# Patient Record
Sex: Female | Born: 1995 | Race: Black or African American | Hispanic: No | Marital: Single | Smoking: Never smoker
Health system: Southern US, Community
[De-identification: ages and names within clinical notes are randomized; demographics above are authoritative.]

## PROBLEM LIST (undated history)

## (undated) DIAGNOSIS — D649 Anemia, unspecified: Secondary | ICD-10-CM

## (undated) DIAGNOSIS — F419 Anxiety disorder, unspecified: Secondary | ICD-10-CM

## (undated) DIAGNOSIS — F32A Depression, unspecified: Secondary | ICD-10-CM

## (undated) DIAGNOSIS — IMO0002 Reserved for concepts with insufficient information to code with codable children: Secondary | ICD-10-CM

## (undated) DIAGNOSIS — T7840XA Allergy, unspecified, initial encounter: Secondary | ICD-10-CM

## (undated) HISTORY — DX: Anemia, unspecified: D64.9

## (undated) HISTORY — PX: FRACTURE SURGERY: SHX138

## (undated) HISTORY — DX: Reserved for concepts with insufficient information to code with codable children: IMO0002

## (undated) HISTORY — DX: Allergy, unspecified, initial encounter: T78.40XA

## (undated) HISTORY — DX: Anxiety disorder, unspecified: F41.9

## (undated) HISTORY — DX: Depression, unspecified: F32.A

---
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
## 2015-02-07 ENCOUNTER — Telehealth: Payer: Self-pay | Admitting: Family Medicine

## 2015-02-07 NOTE — Telephone Encounter (Signed)
Spoke with patient  And an appointment made for 02/08/15

## 2015-02-07 NOTE — Telephone Encounter (Signed)
[facility] - Client TELEPHONE ADVICE RECORD [facility] Patient Name: Donna Montgomery DOB: Oct 07, 1995 Initial Comment Caller states she is having headaches and ear pain in right ear. Nurse Assessment Nurse: Ladona RidgelGaddy, RN, Felicia Date/Time (Eastern Time): 02/07/2015 3:15:24 PM Confirm and document reason for call. If symptomatic, describe symptoms. ---PT has had HA - level 5 (worse than ear) and R ear pain for 2 weeks. No fever. Has the patient traveled out of the country within the last 30 days? ---No Does the patient require triage? ---Yes Related visit to physician within the last 2 weeks? ---No Does the PT have any chronic conditions? (i.e. diabetes, asthma, etc.) ---Yes List chronic conditions. ---asthma Did the patient indicate they were pregnant? ---NoGuidelines Guideline Title Affirmed Question Affirmed Notes Headache [1] MODERATE headache (e.g., interferes with normal activities) AND [2] present > 24 hours AND [3] unexplained (Exceptions: analgesics not tried, typical migraine, or headache part of viral illness) Final Disposition User See Physician within 24 Hours BlueGaddy, RN, Sunny SchleinFelicia Comments Per Darliss RidgelProfile Todd, MD wants to see all his pts even if no appt --- Sue LushAndrea reached at backline and she said to tell pt she will speak with Dr. Tawanna Coolerodd now and call her back with a plan. Gave caller this advice

## 2015-02-08 ENCOUNTER — Ambulatory Visit (INDEPENDENT_AMBULATORY_CARE_PROVIDER_SITE_OTHER): Payer: 59 | Admitting: Family Medicine

## 2015-02-08 VITALS — BP 120/80 | Temp 98.2°F | Wt 164.0 lb

## 2015-02-08 DIAGNOSIS — J309 Allergic rhinitis, unspecified: Secondary | ICD-10-CM | POA: Diagnosis not present

## 2015-02-08 DIAGNOSIS — H6591 Unspecified nonsuppurative otitis media, right ear: Secondary | ICD-10-CM

## 2015-02-08 MED ORDER — FLUTICASONE PROPIONATE 50 MCG/ACT NA SUSP: 2.0000 | Freq: Every day | NASAL | Status: DC

## 2015-02-08 MED ORDER — AMOXICILLIN 875 MG PO TABS: 875.0000 mg | ORAL_TABLET | Freq: Two times a day (BID) | ORAL | Status: DC

## 2015-02-08 NOTE — Patient Instructions (Signed)
-  As we discussed, we have prescribed a new medication for you at this appointment. We discussed the common and serious potential adverse effects of this medication and you can review these and more with the pharmacist when you pick up your medication.  Please follow the instructions for use carefully and notify us immediately if you have any problems taking this medication.  -use the nasal spray 2 sprays each nostril for 1 month, then 1 spray each nostril  -follow up if symptoms worsen or persist

## 2015-02-08 NOTE — Progress Notes (Signed)
  HPI:  Acute visit for:  Ear Pain: -started 2 weeks ago -symptoms: R ear pain and sinus pain -denies: fevers, hearing loss, vision changes, sore throat, cough -hx of allergies and asthma - taking singulair, alb prophylactic with exercise - EIB  ROS: See pertinent positives and negatives per HPI.  Past Medical History  Diagnosis Date  . Allergy   . Asthma     No past surgical history on file.  Family History  Problem Relation Age of Onset  . Asthma Other     History   Social History  . Marital Status: Single    Spouse Name: N/A  . Number of Children: N/A  . Years of Education: N/A   Social History Main Topics  . Smoking status: Never Smoker   . Smokeless tobacco: Not on file  . Alcohol Use: No  . Drug Use: No  . Sexual Activity: Not on file   Other Topics Concern  . Not on file   Social History Narrative  . No narrative on file     Current outpatient prescriptions:  .  albuterol (PROVENTIL HFA;VENTOLIN HFA) 108 (90 BASE) MCG/ACT inhaler, Inhale 1 puff into the lungs every 6 (six) hours as needed. For wheezing/ and exercise induced asthma, Disp: 1 Inhaler, Rfl: 2 .  EPIDUO 0.1-2.5 % gel, Place 1 application onto the skin At bedtime. To face, Disp: , Rfl:  .  montelukast (SINGULAIR) 10 MG tablet, Take 1 tablet (10 mg total) by mouth at bedtime., Disp: 100 tablet, Rfl: 3 .  amoxicillin (AMOXIL) 875 MG tablet, Take 1 tablet (875 mg total) by mouth 2 (two) times daily., Disp: 20 tablet, Rfl: 0 .  fluticasone (FLONASE) 50 MCG/ACT nasal spray, Place 2 sprays into both nostrils daily., Disp: 16 g, Rfl: 6  EXAM:  Filed Vitals:   02/08/15 1108  BP: 120/80  Temp: 98.2 F (36.8 C)    There is no height on file to calculate BMI.  GENERAL: vitals reviewed and listed above, alert, oriented, appears well hydrated and in no acute distress  HEENT: atraumatic, conjunttiva clear, no obvious abnormalities on inspection of external nose and ears, normal appearance of ear  canals and TMs except for white effusion with bulging TM on R, clear effusion L, clear nasal congestion with boggy pale turbinates, mild post oropharyngeal erythema with PND, no tonsillar edema or exudate, no sinus TTP  NECK: no obvious masses on inspection  LUNGS: clear to auscultation bilaterally, no wheezes, rales or rhonchi, good air movement  CV: HRRR, no peripheral edema  MS: moves all extremities without noticeable abnormality  PSYCH: pleasant and cooperative, no obvious depression or anxiety  ASSESSMENT AND PLAN:  Discussed the following assessment and plan:  Allergic rhinitis, unspecified allergic rhinitis type - Plan: fluticasone (FLONASE) 50 MCG/ACT nasal spray  Mucoid otitis media of right ear, unspecified chronicity - Plan: amoxicillin (AMOXIL) 875 MG tablet  -uncontrolled allergic rhinitis w/ 2ndary otitis media -add INS, amox - proper use and risks discussed -Patient advised to return or notify a doctor immediately if symptoms worsen or persist or new concerns arise.  There are no Patient Instructions on file for this visit.   Kriste BasqueKIM, Katisha Shimizu R.

## 2015-02-08 NOTE — Progress Notes (Signed)
Pre visit review using our clinic review tool, if applicable. No additional management support is needed unless otherwise documented below in the visit note. 

## 2015-02-12 ENCOUNTER — Ambulatory Visit: Payer: Self-pay | Admitting: Internal Medicine

## 2015-09-15 HISTORY — PX: OPEN REDUCTION INTERNAL FIXATION (ORIF) HAND: SHX5991

## 2017-02-01 ENCOUNTER — Ambulatory Visit (INDEPENDENT_AMBULATORY_CARE_PROVIDER_SITE_OTHER): Payer: BLUE CROSS/BLUE SHIELD | Admitting: Family Medicine

## 2017-02-01 ENCOUNTER — Encounter: Payer: Self-pay | Admitting: Family Medicine

## 2017-02-01 VITALS — BP 100/60 | Temp 98.4°F | Ht 75.0 in | Wt 164.0 lb

## 2017-02-01 DIAGNOSIS — J3089 Other allergic rhinitis: Secondary | ICD-10-CM

## 2017-02-01 DIAGNOSIS — J45990 Exercise induced bronchospasm: Secondary | ICD-10-CM | POA: Diagnosis not present

## 2017-02-01 DIAGNOSIS — Z0000 Encounter for general adult medical examination without abnormal findings: Secondary | ICD-10-CM | POA: Insufficient documentation

## 2017-02-01 DIAGNOSIS — J4521 Mild intermittent asthma with (acute) exacerbation: Secondary | ICD-10-CM

## 2017-02-01 DIAGNOSIS — J302 Other seasonal allergic rhinitis: Secondary | ICD-10-CM

## 2017-02-01 DIAGNOSIS — N938 Other specified abnormal uterine and vaginal bleeding: Secondary | ICD-10-CM | POA: Insufficient documentation

## 2017-02-01 DIAGNOSIS — Z0001 Encounter for general adult medical examination with abnormal findings: Secondary | ICD-10-CM

## 2017-02-01 DIAGNOSIS — N6002 Solitary cyst of left breast: Secondary | ICD-10-CM

## 2017-02-01 MED ORDER — FLUTICASONE PROPIONATE 50 MCG/ACT NA SUSP: 2.0000 | Freq: Every day | NASAL | 11 refills | Status: DC

## 2017-02-01 MED ORDER — ETHYNODIOL DIAC-ETH ESTRADIOL 1-50 MG-MCG PO TABS: 1.0000 | ORAL_TABLET | Freq: Every day | ORAL | 4 refills | Status: DC

## 2017-02-01 MED ORDER — FLUTICASONE PROPIONATE HFA 220 MCG/ACT IN AERO: 2.0000 | INHALATION_SPRAY | Freq: Two times a day (BID) | RESPIRATORY_TRACT | 4 refills | Status: DC

## 2017-02-01 MED ORDER — PREDNISONE 20 MG PO TABS: ORAL_TABLET | ORAL | 1 refills | Status: DC

## 2017-02-01 MED ORDER — ALBUTEROL SULFATE HFA 108 (90 BASE) MCG/ACT IN AERS: 1.0000 | INHALATION_SPRAY | Freq: Four times a day (QID) | RESPIRATORY_TRACT | 2 refills | Status: DC | PRN

## 2017-02-01 MED ORDER — MONTELUKAST SODIUM 10 MG PO TABS: 10.0000 mg | ORAL_TABLET | Freq: Every day | ORAL | 3 refills | Status: DC

## 2017-02-01 NOTE — Progress Notes (Signed)
Donna Montgomery is a 21 year old single female nonsmoker recently graduated [employer] who comes in today for general physical examination  She has a history of allergic rhinitis for which she takes Flonase and Singulair  This past year she developed symptoms of asthma. They've gotten worse this spring. She's had a history of exercise-induced asthma for which she uses albuterol. The trainer at school send her to an allergist in [place] who gave her Flovent 220 g 1 puff twice a day. That's helped but she still has a lot of head congestion postnasal drip.  She's got a tryout for the [employer]  14 point review of systems review is otherwise negative except for extremely heavy. Zetia last 4-5 days. She's not sexually active.  Vaccinations up-to-date  BP 100/60 (BP Location: Right Arm)   Temp 98.4 F (36.9 C) (Oral)   Ht 6\' 3"  (1.905 m)   Wt 164 lb (74.4 kg)   LMP 01/26/2017   BMI 20.50 kg/m  Examination HEENT were negative neck was supple thyroid is not enlarged cardiopulmonary exam normal specifically PMI was in the fifth intercostal space midclavicular line no heaves or thrills S1-S2 within normal limits S2 is physiologically split I can appreciate no murmurs rubs nor gallops.  Abdominal exam negative extremity is normal skin normal peripheral pulses normal  Breast exam normal except for small cysts left breast 3:00 an inch from the nipple.  #1 healthy female  #2 allergic rhinitis........ continue Flonase and Singulair  #3 asthma exercise induced with a seasonal component,,,,,,,, continue the inhaled steroid,,,,, short course of oral prednisone burst and taper  #4 DU B....... BCPs........ follow-up in September

## 2017-02-01 NOTE — Patient Instructions (Signed)
Prednisone 20 mg.........Marland Kitchen. 1 tab 5 days, half a tab 5 days, then half a tab Monday Wednesday Friday for a two-week taper  Flovent....... inhaled steroid......Marland Kitchen. 1 puff twice a day. Remember to swish and spit after using this medicine  Start your BCPs now........... follow-up in September  Continue steroid nasal spray and the Singulair for your allergic rhinitis

## 2017-05-14 ENCOUNTER — Telehealth: Payer: Self-pay | Admitting: Family Medicine

## 2017-05-14 NOTE — Telephone Encounter (Signed)
Pt states she is going to [place] on 9/9 and wants to take 7 mos of meds with her. Pt need montelukast (SINGULAIR) 10 MG tablet  ethynodiol-ethinyl estradiol (ZOVIA 1/50E, 28,) 1-50 MG-MCG tablet  [facility] - [place], [place] - [address]

## 2017-05-18 ENCOUNTER — Other Ambulatory Visit: Payer: Self-pay | Admitting: Family Medicine

## 2017-05-18 DIAGNOSIS — J302 Other seasonal allergic rhinitis: Secondary | ICD-10-CM

## 2017-05-18 DIAGNOSIS — J45990 Exercise induced bronchospasm: Secondary | ICD-10-CM

## 2017-05-18 DIAGNOSIS — J4521 Mild intermittent asthma with (acute) exacerbation: Secondary | ICD-10-CM

## 2017-05-18 MED ORDER — MONTELUKAST SODIUM 10 MG PO TABS: 10.0000 mg | ORAL_TABLET | Freq: Every day | ORAL | 3 refills | Status: DC

## 2017-05-18 MED ORDER — ETHYNODIOL DIAC-ETH ESTRADIOL 1-50 MG-MCG PO TABS: 1.0000 | ORAL_TABLET | Freq: Every day | ORAL | 4 refills | Status: DC

## 2017-05-18 NOTE — Telephone Encounter (Signed)
Last OV was 02/01/17 discussing DUB and allergic rhinitis. Please advise if ok to refill for 7 months for patients trip to [place].  She states she will be gone for 7 months.

## 2017-05-18 NOTE — Telephone Encounter (Signed)
Pt states she is going to [place] on 9/9 and wants to take 7 mos of meds with her. °Pt need °montelukast (SINGULAIR) 10 MG tablet ° °ethynodiol-ethinyl estradiol (ZOVIA 1/50E, 28,) 1-50 MG-MCG tablet ° °[facility] - [place], [place] - [address] ° ° °

## 2017-07-27 ENCOUNTER — Encounter: Payer: Self-pay | Admitting: Family Medicine

## 2017-07-27 ENCOUNTER — Ambulatory Visit (INDEPENDENT_AMBULATORY_CARE_PROVIDER_SITE_OTHER): Payer: BLUE CROSS/BLUE SHIELD | Admitting: Family Medicine

## 2017-07-27 ENCOUNTER — Ambulatory Visit (INDEPENDENT_AMBULATORY_CARE_PROVIDER_SITE_OTHER)
Admission: RE | Admit: 2017-07-27 | Discharge: 2017-07-27 | Disposition: A | Discharge status: Home / Self Care | Payer: BLUE CROSS/BLUE SHIELD | Source: Ambulatory Visit | Attending: Family Medicine | Admitting: Family Medicine

## 2017-07-27 VITALS — BP 136/88 | HR 80 | Temp 98.2°F | Wt 182.0 lb

## 2017-07-27 DIAGNOSIS — M79674 Pain in right toe(s): Secondary | ICD-10-CM

## 2017-07-27 DIAGNOSIS — G8929 Other chronic pain: Secondary | ICD-10-CM

## 2017-07-27 DIAGNOSIS — S060X0A Concussion without loss of consciousness, initial encounter: Secondary | ICD-10-CM | POA: Diagnosis not present

## 2017-07-27 NOTE — Patient Instructions (Signed)
Go to the main office now for x-rays of your right foot.......... I will call you the report  By definition you had a mild concussion because she had trauma tear head that resulted in a mild headache and some other neurologic symptoms. With a mild concussion we recommend brain rest for 7-10 days. Tylenol or aspirin when necessary for headache  Return when necessary

## 2017-07-27 NOTE — Progress Notes (Signed)
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 At that time she had no loss of consciousness had a mild headache but no other neurologic symptoms. She was kept out of basketball for a week and went back and has played well since that time with no problems until 10 days ago when she got hit in the head going up for a lay up. She came out for a while then went back and played and finish the game after gain she developed a mild headache but again no neurologic symptoms. [place] team [place].  She's had pain at the base of her right fifth toe for 2-3 weeks with no history of trauma. That joint is swollen and tender.  BP 136/88 (BP Location: Left Arm, Patient Position: Sitting, Cuff Size: Normal)   Pulse 80   Temp 98.2 F (36.8 C) (Oral)   Wt 182 lb (82.6 kg)   LMP 07/13/2017 (Approximate)   BMI 22.75 kg/m  She's well-developed well-nourished female no acute distress examination HEENT was negative neurologic exam included muscle strength sensation reflexes gait all within normal limits.  Examination foot shows bilateral bunions. She does have some tenderness proximal joint right fifth toe......... x-ray pending  #1 concussion mild........... imaging not indicated....... Tylenol aspirin Motrin for mild headache........ okay to resume basketball in one week when she goes back to [place].  #2 pain swelling proximal joint right fifth toe,,,,,,,,, sent for x-ray rule out stress fracture

## 2018-04-29 ENCOUNTER — Ambulatory Visit: Payer: Self-pay | Admitting: *Deleted

## 2018-04-29 ENCOUNTER — Encounter: Payer: Self-pay | Admitting: Family Medicine

## 2018-04-29 ENCOUNTER — Other Ambulatory Visit: Payer: Self-pay | Admitting: *Deleted

## 2018-04-29 ENCOUNTER — Ambulatory Visit: Payer: BLUE CROSS/BLUE SHIELD | Admitting: Family Medicine

## 2018-04-29 VITALS — BP 110/70 | HR 74 | Temp 98.7°F | Wt 169.1 lb

## 2018-04-29 DIAGNOSIS — Z8711 Personal history of peptic ulcer disease: Secondary | ICD-10-CM

## 2018-04-29 DIAGNOSIS — R1013 Epigastric pain: Secondary | ICD-10-CM

## 2018-04-29 MED ORDER — ADAPALENE-BENZOYL PEROXIDE 0.1-2.5 % EX GEL: 1.0000 "application " | Freq: Every day | CUTANEOUS | 3 refills | Status: DC

## 2018-04-29 NOTE — Progress Notes (Signed)
  Subjective:     Patient ID: Donna Montgomery, female   DOB: 1995/10/18, 22 y.o.   MRN: [id number]  HPI Patient seen with some epigastric burning type pain past couple days. Onset around Tuesday. She did eat some pizza and feels this may have exacerbated. She has not had any nausea or vomiting. No melena. Minimal caffeine use. Rare alcohol use. She had taken a couple ibuprofen on Tuesday and Wednesday of this week but no regular nonsteroidal use.  Denies appetite or weight change. She states she had peptic ulcer 2013 with positive Helicobacter pylori- which was treated  Past Medical History:  Diagnosis Date  . Allergy   . Asthma    No past surgical history on file.  reports that she has never smoked. She has never used smokeless tobacco. She reports that she drinks alcohol. She reports that she does not use drugs. family history includes Asthma in her other. No Known Allergies   Review of Systems  Constitutional: Negative for appetite change, chills, fever and unexpected weight change.  Cardiovascular: Negative for chest pain.  Gastrointestinal: Positive for abdominal pain. Negative for abdominal distention, blood in stool, diarrhea, nausea and vomiting.       Objective:   Physical Exam  Constitutional: She appears well-developed and well-nourished.  Cardiovascular: Normal rate and regular rhythm.  Pulmonary/Chest: Effort normal and breath sounds normal.  Abdominal: Normal appearance and bowel sounds are normal. She exhibits no distension. There is tenderness in the epigastric area.  Minimal tenderness epigastric region. No guarding or rebound. No hepatomegaly or splenomegaly.       Assessment:     Patient presents with a few day history of epigastric burning sensation. Question GERD versus gastritis. History of peptic ulcer disease as above. She apparently was treated for Helicobacter pylori previously. She does not show any evidence for acute active bleeding such as melena or  hematemesis    Plan:     Parke Simmers-Bland diet -Avoid all nonsteroidals if possible -Try over-the-counter Zantac 150 mg or Pepcid 20 mg twice daily next few days. If symptoms not relieved with that try over-the-counter Nexium or Prilosec. Consider GI follow up if not relieved with any of the above. -Check helicobacter pylori stool antigen -Follow-up immediately for any progressive pain, melena, hematemesis  Kristian CoveyBruce W Burchette MD [facility]

## 2018-04-29 NOTE — Telephone Encounter (Signed)
Pt is on the schedule to see Dr. Caryl NeverBurchette this morning 04/29/2018.

## 2018-04-29 NOTE — Telephone Encounter (Signed)
Pt called with complaints of stomach pain that started on 04/26/18; she says that it is dull and burning, and it feels like it did when she had an ulcer before; the pt says that she had some lower back pain but she was moving some things on 04/24/18; also she took ibuprofen 600 mg daily on 8/14 and 8/15 but her stomach was hurting prior to this; recommendations made per nurse triage including seeing a health care provider within 4 hours; pt [facility] but he has no available appointments within the parameters per guideline; pt offered and accepted appointment with Dr Evelena PeatBruce Burchette, [facility] today at 1145; she verbalizes understanding; will route to office for notification of this upcoming appointment. Reason for Disposition . [1] MILD-MODERATE pain AND [2] constant AND [3] present > 2 hours  Answer Assessment - Initial Assessment Questions 1. LOCATION: "Where does it hurt?"      Upper abdomen between belly button and rib cage 2. RADIATION: "Does the pain shoot anywhere else?" (e.g., chest, back)    Lower Back pain (pt states that she moved some things on Sunday 04/24/18) 3. ONSET: "When did the pain begin?" (e.g., minutes, hours or days ago)      04/26/18 4. SUDDEN: "Gradual or sudden onset?"     gradual 5. PATTERN "Does the pain come and go, or is it constant?"    - If constant: "Is it getting better, staying the same, or worsening?"      (Note: Constant means the pain never goes away completely; most serious pain is constant and it progresses)     - If intermittent: "How long does it last?" "Do you have pain now?"     (Note: Intermittent means the pain goes away completely between bouts)    constant 6. SEVERITY: "How bad is the pain?"  (e.g., Scale 1-10; mild, moderate, or severe)   - MILD (1-3): doesn't interfere with normal activities, abdomen soft and not tender to touch    - MODERATE (4-7): interferes with normal activities or awakens from sleep, tender to  touch    - SEVERE (8-10): excruciating pain, doubled over, unable to do any normal activities      Moderate; has awakened pt from sleep 7. RECURRENT SYMPTOM: "Have you ever had this type of abdominal pain before?" If so, ask: "When was the last time?" and "What happened that time?"      Previous ulcer in 2013 8. CAUSE: "What do you think is causing the abdominal pain?"     Pt does not knw 9. RELIEVING/AGGRAVATING FACTORS: "What makes it better or worse?" (e.g., movement, antacids, bowel movement)     Eating makes it better; pizza and almond milk made it worse 10. OTHER SYMPTOMS: "Has there been any vomiting, diarrhea, constipation, or urine problems?"       no 11. PREGNANCY: "Is there any chance you are pregnant?" "When was your last menstrual period?"       No LMP 04/02/18  Protocols used: ABDOMINAL PAIN - [facility]

## 2018-04-29 NOTE — Patient Instructions (Signed)
Food Choices for Gastroesophageal Reflux Disease, Adult When you have gastroesophageal reflux disease (GERD), the foods you eat and your eating habits are very important. Choosing the right foods can help ease the discomfort of GERD. Consider working with a diet and nutrition specialist (dietitian) to help you make healthy food choices. What general guidelines should I follow? Eating plan  Choose healthy foods low in fat, such as fruits, vegetables, whole grains, low-fat dairy products, and lean meat, fish, and poultry.  Eat frequent, small meals instead of three large meals each day. Eat your meals slowly, in a relaxed setting. Avoid bending over or lying down until 2-3 hours after eating.  Limit high-fat foods such as fatty meats or fried foods.  Limit your intake of oils, butter, and shortening to less than 8 teaspoons each day.  Avoid the following: ? Foods that cause symptoms. These may be different for different people. Keep a food diary to keep track of foods that cause symptoms. ? Alcohol. ? Drinking large amounts of liquid with meals. ? Eating meals during the 2-3 hours before bed.  Cook foods using methods other than frying. This may include baking, grilling, or broiling. Lifestyle   Maintain a healthy weight. Ask your health care provider what weight is healthy for you. If you need to lose weight, work with your health care provider to do so safely.  Exercise for at least 30 minutes on 5 or more days each week, or as told by your health care provider.  Avoid wearing clothes that fit tightly around your waist and chest.  Do not use any products that contain nicotine or tobacco, such as cigarettes and e-cigarettes. If you need help quitting, ask your health care provider.  Sleep with the head of your bed raised. Use a wedge under the mattress or blocks under the bed frame to raise the head of the bed. What foods are not recommended? The items listed may not be a complete  list. Talk with your dietitian about what dietary choices are best for you. Grains Pastries or quick breads with added fat. French toast. Vegetables Deep fried vegetables. French fries. Any vegetables prepared with added fat. Any vegetables that cause symptoms. For some people this may include tomatoes and tomato products, chili peppers, onions and garlic, and horseradish. Fruits Any fruits prepared with added fat. Any fruits that cause symptoms. For some people this may include citrus fruits, such as oranges, grapefruit, pineapple, and lemons. Meats and other protein foods High-fat meats, such as fatty beef or pork, hot dogs, ribs, ham, sausage, salami and bacon. Fried meat or protein, including fried fish and fried chicken. Nuts and nut butters. Dairy Whole milk and chocolate milk. Sour cream. Cream. Ice cream. Cream cheese. Milk shakes. Beverages Coffee and tea, with or without caffeine. Carbonated beverages. Sodas. Energy drinks. Fruit juice made with acidic fruits (such as [place] or grapefruit). Tomato juice. Alcoholic drinks. Fats and oils Butter. Margarine. Shortening. Ghee. Sweets and desserts Chocolate and cocoa. Donuts. Seasoning and other foods Pepper. Peppermint and spearmint. Any condiments, herbs, or seasonings that cause symptoms. For some people, this may include curry, hot sauce, or vinegar-based salad dressings. Summary  When you have gastroesophageal reflux disease (GERD), food and lifestyle choices are very important to help ease the discomfort of GERD.  Eat frequent, small meals instead of three large meals each day. Eat your meals slowly, in a relaxed setting. Avoid bending over or lying down until 2-3 hours after eating.  Limit high-fat   foods such as fatty meat or fried foods. This information is not intended to replace advice given to you by your health care provider. Make sure you discuss any questions you have with your health care provider. Document Released:  08/31/2005 Document Revised: 09/01/2016 Document Reviewed: 09/01/2016 Elsevier Interactive Patient Education  2018 [employer]  Try to avoid Ibuprofen as much as possible.  Consider over the counter Zantac 150 mg  Or Pepcid 20 mg twice daily.  If no relief with the above, consider OTC Prilosec or Nexium.

## 2018-04-30 DIAGNOSIS — Z8711 Personal history of peptic ulcer disease: Secondary | ICD-10-CM | POA: Insufficient documentation

## 2018-05-02 LAB — HELICOBACTER PYLORI  SPECIAL ANTIGEN
MICRO NUMBER: 90980175
SPECIMEN QUALITY: ADEQUATE

## 2018-11-26 ENCOUNTER — Other Ambulatory Visit: Payer: Self-pay

## 2018-11-26 ENCOUNTER — Encounter (HOSPITAL_COMMUNITY): Payer: Self-pay | Admitting: Emergency Medicine

## 2018-11-26 ENCOUNTER — Emergency Department (HOSPITAL_COMMUNITY)
Admission: EM | Admit: 2018-11-26 | Discharge: 2018-11-26 | Disposition: A | Discharge status: Home / Self Care | Payer: BLUE CROSS/BLUE SHIELD | Attending: Emergency Medicine | Admitting: Emergency Medicine

## 2018-11-26 DIAGNOSIS — Z20828 Contact with and (suspected) exposure to other viral communicable diseases: Secondary | ICD-10-CM | POA: Diagnosis not present

## 2018-11-26 DIAGNOSIS — J45909 Unspecified asthma, uncomplicated: Secondary | ICD-10-CM | POA: Diagnosis not present

## 2018-11-26 DIAGNOSIS — Z789 Other specified health status: Secondary | ICD-10-CM

## 2018-11-26 NOTE — ED Notes (Signed)
Patient verbalizes understanding of discharge instructions. Opportunity for questioning and answers were provided. Armband removed by staff, pt discharged from ED.  

## 2018-11-26 NOTE — ED Triage Notes (Signed)
Pt to ER for corona virus check because she flew back from [place], [place] yesterday. No symptoms.

## 2018-11-26 NOTE — ED Provider Notes (Signed)
[facility] EMERGENCY DEPARTMENT Provider Note   CSN: [id number] Arrival date & time: 11/26/18  1411    History   Chief Complaint Chief Complaint  Patient presents with  . Follow-up    HPI Marquesha L Amison is a 23 y.o. female.     HPI Patient just returned from [place] for a basketball event.  She was on the plane and someone sitting next to her from [place] was coughing a lot and not covering her mouth.  She reports she has been symptom-free.  But was concerned about possible coronavirus exposure.  Her teammates have not developed any symptoms of illness at this point. Past Medical History:  Diagnosis Date  . Allergy   . Asthma     Patient Active Problem List   Diagnosis Date Noted  . History of peptic ulcer 04/30/2018  . Concussion with no loss of consciousness 07/27/2017  . Chronic toe pain, right foot 07/27/2017  . Routine general medical examination at a health care facility 02/01/2017  . EXERCISE INDUCED ASTHMA 07/11/2010  . Allergic rhinitis 01/29/2009    History reviewed. No pertinent surgical history.   OB History   No obstetric history on file.      Home Medications    Prior to Admission medications   Medication Sig Start Date End Date Taking? Authorizing Provider  Adapalene-Benzoyl Peroxide (EPIDUO) 0.1-2.5 % gel Apply 1 application topically at bedtime. To face 04/29/18   Roderick Pee, MD  albuterol (PROVENTIL HFA;VENTOLIN HFA) 108 (90 Base) MCG/ACT inhaler Inhale 1 puff into the lungs every 6 (six) hours as needed. For wheezing/ and exercise induced asthma 02/01/17   Roderick Pee, MD  loratadine (CLARITIN) 10 MG tablet Take 10 mg by mouth daily.    [provider]  montelukast (SINGULAIR) 10 MG tablet Take 1 tablet (10 mg total) by mouth at bedtime. 05/18/17   Roderick Pee, MD    Family History Family History  Problem Relation Age of Onset  . Asthma Other     Social History Social History   Tobacco Use  . Smoking  status: Never Smoker  . Smokeless tobacco: Never Used  Substance Use Topics  . Alcohol use: Yes    Comment: 1-2 glasses of wine per month  . Drug use: No     Allergies   Patient has no known allergies.   Review of Systems Review of Systems 10 Systems reviewed and are negative for acute change except as noted in the HPI.   Physical Exam Updated Vital Signs BP 121/69 (BP Location: Right Arm)   Pulse 64   Temp 98.3 F (36.8 C) (Oral)   Resp 16   SpO2 100%   Physical Exam Constitutional:      Appearance: She is well-developed.  HENT:     Head: Normocephalic and atraumatic.  Eyes:     Extraocular Movements: Extraocular movements intact.     Conjunctiva/sclera: Conjunctivae normal.  Neck:     Musculoskeletal: Neck supple.  Cardiovascular:     Rate and Rhythm: Normal rate and regular rhythm.     Heart sounds: Normal heart sounds.  Pulmonary:     Effort: Pulmonary effort is normal.     Breath sounds: Normal breath sounds.  Abdominal:     General: Bowel sounds are normal. There is no distension.     Palpations: Abdomen is soft.     Tenderness: There is no abdominal tenderness.  Musculoskeletal: Normal range of motion.  Skin:  General: Skin is warm and dry.  Neurological:     Mental Status: She is alert and oriented to person, place, and time.     GCS: GCS eye subscore is 4. GCS verbal subscore is 5. GCS motor subscore is 6.     Coordination: Coordination normal.      ED Treatments / Results  Labs (all labs ordered are listed, but only abnormal results are displayed) Labs Reviewed - No data to display  EKG None  Radiology No results found.  Procedures Procedures (including critical care time)  Medications Ordered in ED Medications - No data to display   Initial Impression / Assessment and Plan / ED Course  I have reviewed the triage vital signs and the nursing notes.  Pertinent labs & imaging results that were available during my care of the  patient were reviewed by me and considered in my medical decision making (see chart for details).        Patient with recent travel but asymptomatic.  No known direct contact with coronavirus.  Patient given precautions for self isolation and observing for fever, cough, shortness of breath or other associated symptoms.  Follow-up plan and return precautions reviewed.  Final Clinical Impressions(s) / ED Diagnoses   Final diagnoses:  History of recent air travel    ED Discharge Orders    None       Arby Barrette, MD 11/26/18 1621

## 2018-11-26 NOTE — ED Notes (Signed)
Pt asymptomatic. Pt given return precautions for SOB. Advised pt to contact PCP if COVID symptoms occur for testing directions.

## 2018-11-26 NOTE — Discharge Instructions (Signed)
1.  As you have traveled recently, minimize any exposure to others.  Use good hand hygiene and always cover your mouth and nose with the tissue if coughing. 2.  Try to stay home and isolated as much as possible over the next 2 weeks.  If you develop fever, cough, body ache, call your doctor to review your symptoms.  If your symptoms are more severe than typical cold, return to the emergency department.

## 2019-02-14 ENCOUNTER — Other Ambulatory Visit: Payer: Self-pay

## 2019-02-14 ENCOUNTER — Ambulatory Visit: Payer: BLUE CROSS/BLUE SHIELD | Admitting: Family Medicine

## 2019-02-14 VITALS — BP 120/70 | Ht 77.0 in | Wt 175.0 lb

## 2019-02-14 DIAGNOSIS — S76012A Strain of muscle, fascia and tendon of left hip, initial encounter: Secondary | ICD-10-CM

## 2019-02-14 DIAGNOSIS — S83282D Other tear of lateral meniscus, current injury, left knee, subsequent encounter: Secondary | ICD-10-CM

## 2019-02-14 NOTE — Patient Instructions (Signed)
You have irritation, possibly a small tear of your lateral meniscus. Start physical therapy and do home exercises on days you don't go to therapy. Avoid deep squats, lunges, leg press for now. Icing, tylenol, aleve only if needed. Follow up with me in 1 month. We will consider repeating the MRI if not improving but I expect you to feel a lot better.  You have compensatory weakness/strain of your gluteus medius of your left hip also. Start physical therapy for this and do home exercises on days you don't go to therapy. Your exam is otherwise reassuring.

## 2019-02-15 ENCOUNTER — Encounter: Payer: Self-pay | Admitting: Family Medicine

## 2019-02-15 NOTE — Progress Notes (Signed)
PCP: Roderick Pee, MD (Inactive)  Subjective:   HPI: Patient is a 23 y.o. female here for left hip and knee pain.  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 She states approximately 8 months ago during a game she came down hard from a rebound and fell down onto her left side and feels like she injured the lateral aspect of her left knee. Do not have any hip pain at the time but a couple months later started to develop posterior lateral left hip pain. Pain in both areas is sharp. She does get some catching rarely in the left knee though has not had any lately. She reports having done some shock therapy, a little bit of physical therapy and some type of anti-inflammatory that was prescribed to her. She reports limping throughout the rest of her season. She denies any back pain, numbness. No prior injuries to the left hip or knee. No pain on the right side.  Past Medical History:  Diagnosis Date  . Allergy   . Asthma     Current Outpatient Medications on File Prior to Visit  Medication Sig Dispense Refill  . Adapalene-Benzoyl Peroxide (EPIDUO) 0.1-2.5 % gel Apply 1 application topically at bedtime. To face 45 g 3  . albuterol (PROVENTIL HFA;VENTOLIN HFA) 108 (90 Base) MCG/ACT inhaler Inhale 1 puff into the lungs every 6 (six) hours as needed. For wheezing/ and exercise induced asthma 1 Inhaler 2   No current facility-administered medications on file prior to visit.     History reviewed. No pertinent surgical history.  No Known Allergies  Social History   Socioeconomic History  . Marital status: Single    Spouse name: Not on file  . Number of children: Not on file  . Years of education: Not on file  . Highest education level: Not on file  Occupational History  . Not on file  Social Needs  . Financial resource strain: Not on file  . Food insecurity:    Worry: Not on file    Inability: Not on file  . Transportation needs:    Medical: Not on file     Non-medical: Not on file  Tobacco Use  . Smoking status: Never Smoker  . Smokeless tobacco: Never Used  Substance and Sexual Activity  . Alcohol use: Yes    Comment: 1-2 glasses of wine per month  . Drug use: No  . Sexual activity: Never  Lifestyle  . Physical activity:    Days per week: Not on file    Minutes per session: Not on file  . Stress: Not on file  Relationships  . Social connections:    Talks on phone: Not on file    Gets together: Not on file    Attends religious service: Not on file    Active member of club or organization: Not on file    Attends meetings of clubs or organizations: Not on file    Relationship status: Not on file  . Intimate partner violence:    Fear of current or ex partner: Not on file    Emotionally abused: Not on file    Physically abused: Not on file    Forced sexual activity: Not on file  Other Topics Concern  . Not on file  Social History Narrative  . Not on file    Family History  Problem Relation Age of Onset  . Asthma Other     BP 120/70   Ht 6\' 5"  (1.956  m)   Wt 175 lb (79.4 kg)   BMI 20.75 kg/m   Review of Systems: See HPI above.     Objective:  Physical Exam:  Gen: NAD, comfortable in exam room  Left hip: No deformity. FROM with 5/5 strength except 4/5 with hip abduction. TTP within glut medius, less piriformis and greater trochanter. NVI distally.  Right hip: No deformity. FROM with 5/5 strength. No tenderness to palpation. NVI distally.  Left knee: No gross deformity, ecchymoses, swelling. TTP lateral joint line. FROM with 5/5 strength flexion and extension. Negative ant/post drawers. Negative valgus/varus testing. Negative lachmans. Negative mcmurrays, apleys, patellar apprehension, bounce.  Lateral pain with thessalys. Negative ober's. NV intact distally.  Right knee: No deformity. FROM with 5/5 strength. No tenderness to palpation. NVI distally.   Assessment & Plan:  1. Left knee pain -  independently reviewed a printed MRI from [place] - ligaments intact, effusion noted, no apparent meniscus tear.  Unable to visualize on disc however.  Exam consistent with at least irritation of lateral meniscus.  Start physical therapy.  Icing, tylenol, aleve if needed.  F/u in 1 month.  Consider repeat MRI if not improving as expected.  2. Left hip pain - 2/2 compensatory glut medius strain.  Start physical therapy, home exercises.

## 2019-02-20 ENCOUNTER — Telehealth: Payer: Self-pay | Admitting: Physical Therapy

## 2019-02-20 NOTE — Telephone Encounter (Signed)
Contacted pt due to her having an evaluation on 6/9 at 4pm and per chart review it appears she had gone through testing for COVID but unfortunately our system did not show results of the testing, and we need to have documentation regarding a negative result.  She stated she had her test results in her my chart, and that it was Negative. I discussed that in order for [place] to see her we needed that negative confirmation and if she can bring it in with her to her appointment.

## 2019-02-21 ENCOUNTER — Other Ambulatory Visit: Payer: Self-pay

## 2019-02-21 ENCOUNTER — Encounter: Payer: Self-pay | Admitting: Physical Therapy

## 2019-02-21 ENCOUNTER — Ambulatory Visit: Payer: BC Managed Care – PPO | Attending: Family Medicine | Admitting: Physical Therapy

## 2019-02-21 DIAGNOSIS — M25552 Pain in left hip: Secondary | ICD-10-CM | POA: Diagnosis present

## 2019-02-21 DIAGNOSIS — G8929 Other chronic pain: Secondary | ICD-10-CM | POA: Insufficient documentation

## 2019-02-21 DIAGNOSIS — M6281 Muscle weakness (generalized): Secondary | ICD-10-CM | POA: Diagnosis present

## 2019-02-21 DIAGNOSIS — M25562 Pain in left knee: Secondary | ICD-10-CM | POA: Insufficient documentation

## 2019-02-21 NOTE — Therapy (Signed)
[facility] [address] [place], [place], [address] Phone: [phone]   Fax:  [phone]  Physical Therapy Evaluation  Patient Details  Name: Donna Montgomery MRN: [id number] Date of Birth: 04/06/1996 Referring Provider (PT): Norton BlizzardShane Hudnall MD   Encounter Date: 02/21/2019  PT End of Session - 02/21/19 1559    Visit Number  1    Number of Visits  13    Date for PT Re-Evaluation  04/04/19    Authorization Type  BCBS    PT Start Time  1600    PT Stop Time  1652    PT Time Calculation (min)  52 min    Activity Tolerance  Patient tolerated treatment well    Behavior During Therapy  [facility] for tasks assessed/performed       Past Medical History:  Diagnosis Date  . Allergy   . Asthma     Past Surgical History:  Procedure Laterality Date  . OPEN REDUCTION INTERNAL FIXATION (ORIF) HAND Left 2017    There were no vitals filed for this visit.   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 She reported going for rebound landing on the L leg and felt pain but kept playing. this occurred back in November 2019. End of the day she reported the knee locking and having pain in the knee.  She reports having popping, giving away and limited knee flexion. Since onset the pain inthe knee has gotten better. She reports the hip started due to favor the knee, the hip started in December and has gradually worsened.    Limitations  Walking;Standing;Other (comment);Sitting   running   How long can you sit comfortably?  90 min     How long can you stand comfortably?  30 min     How long can you walk comfortably?  15-30 min    Diagnostic tests  in [place]    Patient Stated Goals  playing basketball, return to running and exercise with no pain.     Currently in Pain?  Yes    Pain Score  2    at worst 8/10,    Pain Location  Knee    Pain Orientation  Left    Pain Descriptors / Indicators  Dull;Sharp    Pain Type  Chronic pain    Pain Onset  More than a month ago    Pain Frequency  Intermittent    Aggravating Factors   deep knee bending, prolonged standing/ walking, jumping, stairs, lateral movements    Pain Relieving Factors  ice, medication, pull under the knee with rest    Multiple Pain Sites  Yes    Pain Score  5   at worst 10/10   Pain Location  Hip    Pain Orientation  Left    Pain Descriptors / Indicators  Dull;Aching;Sharp   pinch   Pain Type  Chronic pain    Pain Onset  More than a month ago    Pain Frequency  Constant    Aggravating Factors   direct pressure, standing, walking, sitting, squatting, stretching/ yoga    Pain Relieving Factors  ice, medication,          [facility] PT Assessment - 02/21/19 1608      Assessment   Medical Diagnosis  L glute medius strain, L lateral meniscus tear  Referring Provider (PT)  Karlton Lemon MD    Onset Date/Surgical Date  --   knee in November, Hip December   Hand Dominance  Right    Next MD Visit  July    Prior Therapy  no      Precautions   Precaution Comments  deep squating/ knee bending, be mindfull       Restrictions   Weight Bearing Restrictions  No      Balance Screen   Has the patient fallen in the past 6 months  No    Has the patient had a decrease in activity level because of a fear of falling?   No    Is the patient reluctant to leave their home because of a fear of falling?   No      Home Film/video editor residence    Living Arrangements  Parent    Available Help at Discharge  Family    Type of [place] to enter    Entrance Stairs-Number of Steps  42    Entrance Stairs-Rails  Can reach both    [place]  One level    [place]  None      Prior Function   Level of Independence  Independent    Vocation  Full time employment   basketball    Vocation Requirements  running  jumping, sprinting,       Cognition   Overall Cognitive Status  Within Functional Limits for tasks assessed      Observation/Other Assessments   Focus on Therapeutic Outcomes (FOTO)   54% limited   29% limited     Posture/Postural Control   Posture/Postural Control  Postural limitations    Postural Limitations  Forward head      ROM / Strength   AROM / PROM / Strength  AROM;Strength;PROM      AROM   AROM Assessment Site  Knee    Right/Left Knee  Right;Left    Right Knee Extension  0    Right Knee Flexion  136    Left Knee Extension  0    Left Knee Flexion  130   pain returning to extension     PROM   PROM Assessment Site  Knee      Strength   Strength Assessment Site  Hip;Knee    Right/Left Hip  Right;Left    Right Hip Flexion  5/5    Right Hip Extension  5/5    Right Hip ABduction  5/5    Left Hip Flexion  4/5    Left Hip Extension  4-/5    Left Hip ABduction  3+/5    Right/Left Knee  Right;Left    Right Knee Flexion  5/5    Right Knee Extension  5/5    Left Knee Flexion  5/5    Left Knee Extension  5/5      Palpation   Palpation comment  TTP along the TFL, glute medius, minimus and along the piriformis and greater trochanter.  L knee lateral joint line pain      Special Tests    Special Tests  Meniscus Tests    Meniscus Tests  McMurray Test;other      McMurray Test   Findings  Positive    Side  Left      other   Findings  Positive    Comments  [facility]  Ambulation/Gait   Ambulation/Gait  Yes    Gait Pattern  Within Functional Limits                Objective measurements completed on examination: See above findings.      [facility] Adult PT Treatment/Exercise - 02/21/19 1608      Exercises   Exercises  Knee/Hip      Knee/Hip Exercises: Stretches   ITB Stretch  Left;2 reps;30 seconds   in R sidelying     Knee/Hip Exercises: Sidelying   Clams  in R sidelying 1 x 15      Manual Therapy   Manual therapy comments  MTPR along the  glute medius x 3             PT Education - 02/21/19 1559    Education Details  evaluation findings, POC, goals, HEp with proper form/ rationale, anatomy of the area involved.    Person(s) Educated  Patient    Methods  Explanation;Verbal cues;Handout    Comprehension  Verbalized understanding;Verbal cues required       PT Short Term Goals - 02/21/19 1700      PT SHORT TERM GOAL #1   Title  pt to be I with inital HEP     Time  3    Period  Weeks    Status  New    Target Date  03/14/19        PT Long Term Goals - 02/21/19 1700      PT LONG TERM GOAL #1   Title  pt to increase LLE strength grossly to 5/5 with no reprot of pain during testing for hip/ knee stability     Time  6    Period  Weeks    Status  New    Target Date  04/04/19      PT LONG TERM GOAL #2   Title  pt to be able to perform SLS balance for >/= 30 seconds with pertubations reporting no pain for stability     Time  6    Period  Weeks    Status  New    Target Date  04/04/19      PT LONG TERM GOAL #3   Title  pt to be able to perform dynamic plyometric activities with no report of pain or instability for functional return to sport    Time  6    Period  Weeks    Status  New    Target Date  04/04/19      PT LONG TERM GOAL #4   Title  pt to be able to perofrm knee extension exercises to tolerance with pain and return to running and general exercise with no limitations per personal goals     Time  6    Period  Weeks    Status  New    Target Date  04/04/19      PT LONG TERM GOAL #5   Title  increase FOTO score to </= 29% limited to demo improvement in function.     Time  6    Period  Weeks    Status  New    Target Date  04/04/19             Plan - 02/21/19 1656    Clinical Impression Statement  pt presents to OPPT with CC of L knee and hip pain that occured while playing basketball and pt landing on the LLE and felt pain with  report of locking/ catching and buckling and noted assocated  hip soreness due to potentially compensation. She demonstrates functional hip/ knee ROM, but exhibits weakness in the L hip grossly compared bil. special testing was positvie for potential meniscal involvement which is consistent with dx. She would benefit from physical therapy to decrease hip/ knee pain, increase strength, improve functional and dynamic activities for return to sport by addressing the deficits listed.     Stability/Clinical Decision Making  Stable/Uncomplicated    Clinical Decision Making  Low    Rehab Potential  Excellent    PT Frequency  2x / week    PT Duration  6 weeks    PT Treatment/Interventions  ADLs/Self Care Home Management;Cryotherapy;Electrical Stimulation;Iontophoresis 4mg /ml Dexamethasone;Moist Heat;Ultrasound;Functional mobility training;Therapeutic activities;Therapeutic exercise;Balance training;Dry needling;Passive range of motion;Manual techniques;Taping;Patient/family education    PT Next Visit Plan  review/ update HEP, discuss DN, STW along glute medius/ TFL strengthening of the knee/ hip, balance training,     PT Home Exercise Plan  SLR, sidelying hipabduction, sidelying clam, bridge, ITB stretching.     Consulted and Agree with Plan of Care  Patient       Patient will benefit from skilled therapeutic intervention in order to improve the following deficits and impairments:  Pain, Increased fascial restricitons, Improper body mechanics, Postural dysfunction, Decreased strength, Decreased balance, Decreased activity tolerance, Decreased endurance  Visit Diagnosis: Chronic pain of left knee  Pain in left hip  Muscle weakness (generalized)     Problem List Patient Active Problem List   Diagnosis Date Noted  . History of peptic ulcer 04/30/2018  . Concussion with no loss of consciousness 07/27/2017  . Chronic toe pain, right foot 07/27/2017  . Routine general medical examination at a health care facility 02/01/2017  . EXERCISE INDUCED ASTHMA  07/11/2010  . Allergic rhinitis 01/29/2009   Lulu RidingKristoffer Ronnica Dreese PT, DPT, LAT, ATC  02/21/19  5:05 PM      [facility] [address] [place], [place], [address] Phone: [phone]   Fax:  [phone]  Name: Donna Gashkela L Harkin MRN: [id number] Date of Birth: 06-Dec-1995

## 2019-02-27 ENCOUNTER — Other Ambulatory Visit: Payer: Self-pay

## 2019-02-27 ENCOUNTER — Ambulatory Visit: Payer: BC Managed Care – PPO | Admitting: Physical Therapy

## 2019-02-27 ENCOUNTER — Encounter: Payer: Self-pay | Admitting: Physical Therapy

## 2019-02-27 DIAGNOSIS — G8929 Other chronic pain: Secondary | ICD-10-CM

## 2019-02-27 DIAGNOSIS — M25562 Pain in left knee: Secondary | ICD-10-CM | POA: Diagnosis not present

## 2019-02-27 DIAGNOSIS — M25552 Pain in left hip: Secondary | ICD-10-CM

## 2019-02-27 DIAGNOSIS — M6281 Muscle weakness (generalized): Secondary | ICD-10-CM

## 2019-02-27 NOTE — Therapy (Signed)
[place], [place], [address] Phone: [phone]   Fax:  [phone]  Physical Therapy Treatment  Patient Details  Name: Donna Montgomery MRN: [id number] Date of Birth: 1996-01-23 Referring Provider (PT): Karlton Lemon MD   Encounter Date: 02/27/2019  PT End of Session - 02/27/19 1501    Visit Number  2    Number of Visits  13    Date for PT Re-Evaluation  04/04/19    Authorization Type  BCBS    PT Start Time  1502    PT Stop Time  1555    PT Time Calculation (min)  53 min    Activity Tolerance  Patient tolerated treatment well    Behavior During Therapy  [facility] for tasks assessed/performed       Past Medical History:  Diagnosis Date  . Allergy   . Asthma     Past Surgical History:  Procedure Laterality Date  . OPEN REDUCTION INTERNAL FIXATION (ORIF) HAND Left 2017    There were no vitals filed for this visit.  Subjective Assessment - 02/27/19 1501    Subjective  "I am feeling good, I've been doing the exercise which are helping but have been getting some pulling in the knee"    Patient Stated Goals  playing basketball, return to running and exercise with no pain.     Currently in Pain?  Yes    Pain Score  4     Pain Orientation  Left    Pain Descriptors / Indicators  Aching    Pain Type  Chronic pain    Pain Score  3    Pain Location  Hip    Pain Orientation  Left    Pain Descriptors / Indicators  Aching    Pain Type  Chronic pain    Pain Onset  More than a month ago    Pain Frequency  Intermittent         [facility] PT Assessment - 02/27/19 0001      Assessment   Medical Diagnosis  L glute medius strain, L lateral meniscus tear    Referring Provider (PT)  Karlton Lemon MD    Hand Dominance  Right                   [facility] Adult PT Treatment/Exercise - 02/27/19 0001      Knee/Hip Exercises: Stretches   Active Hamstring Stretch  3 reps;30 seconds    ITB Stretch  2 reps;30  seconds      Knee/Hip Exercises: Aerobic   Elliptical  L5 x 5 min elevation L1      Knee/Hip Exercises: Seated   Sit to Sand  1 set;15 reps;without UE support      Knee/Hip Exercises: Sidelying   Hip ABduction  2 sets;10 reps      Modalities   Modalities  Moist Heat      Moist Heat Therapy   Number Minutes Moist Heat  10 Minutes    Moist Heat Location  Hip   L hip in R sidelying     Manual Therapy   Manual Therapy  Soft tissue mobilization;Other (comment)    Manual therapy comments  skilled palpation and monitoring of pt throughout TPDN    Soft tissue mobilization  IASTM along glute medius/ piriformis, and along the distal biceps femoris    Other Manual Therapy  MTPR along the biceps femoris x 2       Trigger  Point Dry Needling - 02/27/19 0001    Consent Given?  Yes    Education Handout Provided  Yes    Muscles Treated Back/Hip  Gluteus medius;Piriformis;Gluteus minimus    Gluteus Minimus Response  Twitch response elicited;Palpable increased muscle length    Gluteus Medius Response  Twitch response elicited;Palpable increased muscle length    Piriformis Response  Twitch response elicited;Palpable increased muscle length           PT Education - 02/27/19 1553    Education Details  muscle anatomy and referral patterns. What TPDN is, what to expect and benefits. reviewed HEP and updated    Person(s) Educated  Patient    Methods  Explanation;Verbal cues;Handout    Comprehension  Verbalized understanding;Verbal cues required       PT Short Term Goals - 02/21/19 1700      PT SHORT TERM GOAL #1   Title  pt to be I with inital HEP     Time  3    Period  Weeks    Status  New    Target Date  03/14/19        PT Long Term Goals - 02/21/19 1700      PT LONG TERM GOAL #1   Title  pt to increase LLE strength grossly to 5/5 with no reprot of pain during testing for hip/ knee stability     Time  6    Period  Weeks    Status  New    Target Date  04/04/19      PT  LONG TERM GOAL #2   Title  pt to be able to perform SLS balance for >/= 30 seconds with pertubations reporting no pain for stability     Time  6    Period  Weeks    Status  New    Target Date  04/04/19      PT LONG TERM GOAL #3   Title  pt to be able to perform dynamic plyometric activities with no report of pain or instability for functional return to sport    Time  6    Period  Weeks    Status  New    Target Date  04/04/19      PT LONG TERM GOAL #4   Title  pt to be able to perofrm knee extension exercises to tolerance with pain and return to running and general exercise with no limitations per personal goals     Time  6    Period  Weeks    Status  New    Target Date  04/04/19      PT LONG TERM GOAL #5   Title  increase FOTO score to </= 29% limited to demo improvement in function.     Time  6    Period  Weeks    Status  New    Target Date  04/04/19            Plan - 02/27/19 1554    Clinical Impression Statement  pt reports improvement in the hip today with some increased hamstring tightness located distally. educated and consent was provided for TPDN focusing on glute med/ piriformis followed with IASTM techniques. continued hips strengthening and stretching, pt report improvement of pain, utilized MHP to calm down soreness at the end of the session.    PT Treatment/Interventions  ADLs/Self Care Home Management;Cryotherapy;Electrical Stimulation;Iontophoresis 4mg /ml Dexamethasone;Moist Heat;Ultrasound;Functional mobility training;Therapeutic activities;Therapeutic exercise;Balance training;Dry needling;Passive range of motion;Manual techniques;Taping;Patient/family education  PT Next Visit Plan  review/ update HEP, response to DN, STW along glute medius/ TFL strengthening of the knee/ hip, balance training,    PT Home Exercise Plan  SLR, sidelying hipabduction, sidelying clam, bridge, ITB stretching. hamstring stretching    Consulted and Agree with Plan of Care  Patient        Patient will benefit from skilled therapeutic intervention in order to improve the following deficits and impairments:  Pain, Increased fascial restricitons, Improper body mechanics, Postural dysfunction, Decreased strength, Decreased balance, Decreased activity tolerance, Decreased endurance  Visit Diagnosis: Chronic pain of left knee  Pain in left hip  Muscle weakness (generalized)     Problem List Patient Active Problem List   Diagnosis Date Noted  . History of peptic ulcer 04/30/2018  . Concussion with no loss of consciousness 07/27/2017  . Chronic toe pain, right foot 07/27/2017  . Routine general medical examination at a health care facility 02/01/2017  . EXERCISE INDUCED ASTHMA 07/11/2010  . Allergic rhinitis 01/29/2009   Lulu RidingKristoffer Wendall Isabell PT, DPT, LAT, ATC  02/27/19  3:58 PM      [facility] [address] [place], [place], [address] Phone: [phone]   Fax:  [phone]  Name: Hiram Gashkela L Schedler MRN: [id number] Date of Birth: [birth year]

## 2019-03-02 ENCOUNTER — Other Ambulatory Visit: Payer: Self-pay

## 2019-03-02 ENCOUNTER — Encounter: Payer: Self-pay | Admitting: Physical Therapy

## 2019-03-02 ENCOUNTER — Ambulatory Visit: Payer: BC Managed Care – PPO | Admitting: Physical Therapy

## 2019-03-02 DIAGNOSIS — G8929 Other chronic pain: Secondary | ICD-10-CM

## 2019-03-02 DIAGNOSIS — M6281 Muscle weakness (generalized): Secondary | ICD-10-CM

## 2019-03-02 DIAGNOSIS — M25552 Pain in left hip: Secondary | ICD-10-CM

## 2019-03-02 DIAGNOSIS — M25562 Pain in left knee: Secondary | ICD-10-CM | POA: Diagnosis not present

## 2019-03-02 NOTE — Therapy (Signed)
[place], [place], [address] Phone: [phone]   Fax:  [phone]  Physical Therapy Treatment  Patient Details  Name: Donna Montgomery MRN: [id number] Date of Birth: 08-Apr-1996 Referring Provider (PT): Karlton Lemon MD   Encounter Date: 03/02/2019  PT End of Session - 03/02/19 1529    Visit Number  3    Number of Visits  13    Date for PT Re-Evaluation  04/04/19    PT Start Time  9381    PT Stop Time  1638    PT Time Calculation (min)  68 min    Activity Tolerance  Patient tolerated treatment well    Behavior During Therapy  [facility] for tasks assessed/performed       Past Medical History:  Diagnosis Date  . Allergy   . Asthma     Past Surgical History:  Procedure Laterality Date  . OPEN REDUCTION INTERNAL FIXATION (ORIF) HAND Left 2017    There were no vitals filed for this visit.  Subjective Assessment - 03/02/19 1530    Subjective  "No pain, I am doing well with the exercise.    Patient Stated Goals  playing basketball, return to running and exercise with no pain.     Currently in Pain?  No/denies    Pain Score  0-No pain                       [facility] Adult PT Treatment/Exercise - 03/02/19 1540      Knee/Hip Exercises: Stretches   Active Hamstring Stretch  2 reps;30 seconds   PNF contract/ relax   ITB Stretch  2 reps;30 seconds      Knee/Hip Exercises: Aerobic   Elliptical  L8 x 6 min elevation L1      Knee/Hip Exercises: Plyometrics   Bilateral Jumping  3 sets;10 reps   using mirror for visual cues   Bilateral Jumping Limitations  intially pt shifted hips to the the R, but with verbal/ visual cues she was able to correct form. Cues needed for knee bending with landing to allow for shock absoprtion inthe quads versus the knee.       Knee/Hip Exercises: Standing   Other Standing Knee Exercises  lateral band walks 2 x 20 with blue theraband   using X technique     Modalities   Modalities  Cryotherapy      Cryotherapy   Number Minutes Cryotherapy  10 Minutes    Cryotherapy Location  Hip;Knee   in R sidelying     Manual Therapy   Manual therapy comments  skilled palpation and monitoring of pt throughout TPDN    Soft tissue mobilization  IASTM along glute medius/ piriformis, and along the distal biceps femoris       Trigger Point Dry Needling - 03/02/19 0001    Consent Given?  Yes    Education Handout Provided  Previously provided    Electrical Stimulation Performed with Dry Needling  Yes    E-stim with Dry Needling Details  IFC set-up, frequency at 10, channel 1 and 2 bil first dot   x 10 min   Gluteus Medius Response  Palpable increased muscle length;Twitch response elicited    Piriformis Response  Twitch response elicited;Palpable increased muscle length        Balance Exercises - 03/02/19 1634      Balance Exercises: Standing   Rebounder  Single leg;Foam/compliant surface;Static;15 reps   with  yellow ball       PT Education - 03/02/19 1637    Education Details  updated HEP for lateral band walks, and working on light jumping Manufacturing systems engineerusing mirror for visual cues    Person(s) Educated  Patient    Methods  Explanation;Handout;Verbal cues    Comprehension  Verbalized understanding;Verbal cues required       PT Short Term Goals - 02/21/19 1700      PT SHORT TERM GOAL #1   Title  pt to be I with inital HEP     Time  3    Period  Weeks    Status  New    Target Date  03/14/19        PT Long Term Goals - 02/21/19 1700      PT LONG TERM GOAL #1   Title  pt to increase LLE strength grossly to 5/5 with no reprot of pain during testing for hip/ knee stability     Time  6    Period  Weeks    Status  New    Target Date  04/04/19      PT LONG TERM GOAL #2   Title  pt to be able to perform SLS balance for >/= 30 seconds with pertubations reporting no pain for stability     Time  6    Period  Weeks    Status  New    Target Date   04/04/19      PT LONG TERM GOAL #3   Title  pt to be able to perform dynamic plyometric activities with no report of pain or instability for functional return to sport    Time  6    Period  Weeks    Status  New    Target Date  04/04/19      PT LONG TERM GOAL #4   Title  pt to be able to perofrm knee extension exercises to tolerance with pain and return to running and general exercise with no limitations per personal goals     Time  6    Period  Weeks    Status  New    Target Date  04/04/19      PT LONG TERM GOAL #5   Title  increase FOTO score to </= 29% limited to demo improvement in function.     Time  6    Period  Weeks    Status  New    Target Date  04/04/19            Plan - 03/02/19 1638    Clinical Impression Statement  Donna Montgomery reports significant improvement with DN last session. continued TPDN on the glute medius using E-stim. continued IASTM techniques along the glute medius. worked on Social research officer, governmentbalance training and jump mechanics which she did require verbal and visual cues for proper form to avoid compensation and allow for knee flexion to promote shock absoprtion. utilized ice pack end of session to calm down soreness end of session.    PT Treatment/Interventions  ADLs/Self Care Home Management;Cryotherapy;Electrical Stimulation;Iontophoresis 4mg /ml Dexamethasone;Moist Heat;Ultrasound;Functional mobility training;Therapeutic activities;Therapeutic exercise;Balance training;Dry needling;Passive range of motion;Manual techniques;Taping;Patient/family education    PT Next Visit Plan  response to DN, STW along glute medius/ TFL strengthening of the knee/ hip, balance training, jump training/ mechanics, quad/ hamstring strengthening.    PT Home Exercise Plan  SLR, sidelying hipabduction, sidelying clam, bridge, ITB stretching. hamstring stretching, lateral band walks       Patient will  benefit from skilled therapeutic intervention in order to improve the following deficits and  impairments:  Pain, Increased fascial restricitons, Improper body mechanics, Postural dysfunction, Decreased strength, Decreased balance, Decreased activity tolerance, Decreased endurance  Visit Diagnosis: 1. Chronic pain of left knee   2. Pain in left hip   3. Muscle weakness (generalized)        Problem List Patient Active Problem List   Diagnosis Date Noted  . History of peptic ulcer 04/30/2018  . Concussion with no loss of consciousness 07/27/2017  . Chronic toe pain, right foot 07/27/2017  . Routine general medical examination at a health care facility 02/01/2017  . EXERCISE INDUCED ASTHMA 07/11/2010  . Allergic rhinitis 01/29/2009   Lulu RidingKristoffer Calel Pisarski PT, DPT, LAT, ATC  03/02/19  4:44 PM      [facility] [address] [place], [place], [address] Phone: [phone]   Fax:  [phone]  Name: Donna Montgomery MRN: [id number] Date of Birth: October 12, 1995

## 2019-03-07 ENCOUNTER — Other Ambulatory Visit: Payer: Self-pay

## 2019-03-07 ENCOUNTER — Encounter: Payer: Self-pay | Admitting: Physical Therapy

## 2019-03-07 ENCOUNTER — Ambulatory Visit: Payer: BC Managed Care – PPO | Admitting: Physical Therapy

## 2019-03-07 DIAGNOSIS — M25562 Pain in left knee: Secondary | ICD-10-CM

## 2019-03-07 DIAGNOSIS — M25552 Pain in left hip: Secondary | ICD-10-CM

## 2019-03-07 DIAGNOSIS — M6281 Muscle weakness (generalized): Secondary | ICD-10-CM

## 2019-03-07 DIAGNOSIS — G8929 Other chronic pain: Secondary | ICD-10-CM

## 2019-03-07 NOTE — Therapy (Signed)
[facility] [address] [place], [place], [address] Phone: [phone]   Fax:  [phone]  Physical Therapy Treatment  Patient Details  Name: Donna Montgomery MRN: [id number] Date of Birth: 18-Dec-1995 Referring Provider (PT): Norton BlizzardShane Hudnall MD   Encounter Date: 03/07/2019  PT End of Session - 03/07/19 1518    Visit Number  4    Number of Visits  13    Date for PT Re-Evaluation  04/04/19    PT Start Time  1516    PT Stop Time  1619    PT Time Calculation (min)  63 min    Activity Tolerance  Patient tolerated treatment well    Behavior During Therapy  [facility] for tasks assessed/performed       Past Medical History:  Diagnosis Date  . Allergy   . Asthma     Past Surgical History:  Procedure Laterality Date  . OPEN REDUCTION INTERNAL FIXATION (ORIF) HAND Left 2017    There were no vitals filed for this visit.  Subjective Assessment - 03/07/19 1520    Subjective  " I am doing pretty good, Still alittle difficulty jumping/ landing"    Currently in Pain?  No/denies    Pain Score  0-No pain                       [facility] Adult PT Treatment/Exercise - 03/07/19 0001      Knee/Hip Exercises: Aerobic   Stepper  L3 x 5 min       Knee/Hip Exercises: Machines for Strengthening   Cybex Knee Extension  1 x 12 25# bil LE, 1 x 12 25# up with both down with LLE    Cybex Knee Flexion  2 x 12 35#, con/ecc with LLE    Hip Cybex  bil hip abductin 2 x 15 37.5#      Knee/Hip Exercises: Plyometrics   Bilateral Jumping  2 sets;5 reps   1 set at 50% max jump, 1 set at 100% max jump   Bilateral Jumping Limitations  pt was able to assess when she was shifting her hips/ weight requiring minimal verbal cues    Broad Jump  3 sets   3 hops each set, 50% max jump   Other Plyometric Exercises  alterntating toe taps on 6 inch step gradually increasing pace 3 x 30 sec      Knee/Hip Exercises: Standing   Forward Lunges  2 sets;10  reps;Both   touching down on to BOSU   Forward Lunges Limitations  1 set regular lunge, 1 set with medial pull of lead knee to promote glute med activation   pt hesitant to do exercise requiring cues for redirection     Cryotherapy   Number Minutes Cryotherapy  10 Minutes    Cryotherapy Location  Hip;Knee    Type of Cryotherapy  Ice pack   in R sidelying with pillow between the knees         Balance Exercises - 03/07/19 1612      Balance Exercises: Standing   Rebounder  Foam/compliant surface;Single leg;15 reps   facing forward 2 sets, facing L/R 1 set ea. with yellow ball         PT Short Term Goals - 02/21/19 1700      PT SHORT TERM GOAL #1   Title  pt to be I with inital HEP     Time  3    Period  Weeks  Status  New    Target Date  03/14/19        PT Long Term Goals - 02/21/19 1700      PT LONG TERM GOAL #1   Title  pt to increase LLE strength grossly to 5/5 with no reprot of pain during testing for hip/ knee stability     Time  6    Period  Weeks    Status  New    Target Date  04/04/19      PT LONG TERM GOAL #2   Title  pt to be able to perform SLS balance for >/= 30 seconds with pertubations reporting no pain for stability     Time  6    Period  Weeks    Status  New    Target Date  04/04/19      PT LONG TERM GOAL #3   Title  pt to be able to perform dynamic plyometric activities with no report of pain or instability for functional return to sport    Time  6    Period  Weeks    Status  New    Target Date  04/04/19      PT LONG TERM GOAL #4   Title  pt to be able to perofrm knee extension exercises to tolerance with pain and return to running and general exercise with no limitations per personal goals     Time  6    Period  Weeks    Status  New    Target Date  04/04/19      PT LONG TERM GOAL #5   Title  increase FOTO score to </= 29% limited to demo improvement in function.     Time  6    Period  Weeks    Status  New    Target Date   04/04/19            Plan - 03/07/19 1613    Clinical Impression Statement  pt reports no pain and has been consistent with her HEP. opted to hold off on TPDN today and focus on strengthening of the LLE and progress balance training. She did well with progression of plyometric training which she perofrmed well but was hesitant requiring redirection of focus during exercise which she was able to complete. utilized ice pack for hip/ knee for muscle soreness.    PT Treatment/Interventions  ADLs/Self Care Home Management;Cryotherapy;Electrical Stimulation;Iontophoresis 4mg /ml Dexamethasone;Moist Heat;Ultrasound;Functional mobility training;Therapeutic activities;Therapeutic exercise;Balance training;Dry needling;Passive range of motion;Manual techniques;Taping;Patient/family education    PT Next Visit Plan  response to DN, STW along glute medius/ TFL strengthening of the knee/ hip, balance training, jump training/ mechanics, progressing lateral movements and single leg quad/ hamstring strengthening.    PT Home Exercise Plan  SLR, sidelying hipabduction, sidelying clam, bridge, ITB stretching. hamstring stretching, lateral band walks    Consulted and Agree with Plan of Care  Patient       Patient will benefit from skilled therapeutic intervention in order to improve the following deficits and impairments:  Pain, Increased fascial restricitons, Improper body mechanics, Postural dysfunction, Decreased strength, Decreased balance, Decreased activity tolerance, Decreased endurance  Visit Diagnosis: 1. Chronic pain of left knee   2. Pain in left hip   3. Muscle weakness (generalized)        Problem List Patient Active Problem List   Diagnosis Date Noted  . History of peptic ulcer 04/30/2018  . Concussion with no loss of consciousness 07/27/2017  .  Chronic toe pain, right foot 07/27/2017  . Routine general medical examination at a health care facility 02/01/2017  . EXERCISE INDUCED ASTHMA  07/11/2010  . Allergic rhinitis 01/29/2009   Starr Lake PT, DPT, LAT, ATC  03/07/19  4:23 PM      [place] [facility] [address] [place], [place], [address] Phone: [phone]   Fax:  [phone]  Name: Donna Montgomery MRN: [id number] Date of Birth: 1995-09-17

## 2019-03-09 ENCOUNTER — Other Ambulatory Visit: Payer: Self-pay

## 2019-03-09 ENCOUNTER — Encounter: Payer: Self-pay | Admitting: Physical Therapy

## 2019-03-09 ENCOUNTER — Ambulatory Visit: Payer: BC Managed Care – PPO | Admitting: Physical Therapy

## 2019-03-09 DIAGNOSIS — G8929 Other chronic pain: Secondary | ICD-10-CM

## 2019-03-09 DIAGNOSIS — M6281 Muscle weakness (generalized): Secondary | ICD-10-CM

## 2019-03-09 DIAGNOSIS — M25562 Pain in left knee: Secondary | ICD-10-CM | POA: Diagnosis not present

## 2019-03-09 DIAGNOSIS — M25552 Pain in left hip: Secondary | ICD-10-CM

## 2019-03-09 NOTE — Therapy (Signed)
[facility] [address] [place], [place], [address] Phone: [phone]   Fax:  [phone]  Physical Therapy Treatment  Patient Details  Name: Donna Montgomery MRN: [id number] Date of Birth: 15-Jul-1996 Referring Provider (PT): Norton BlizzardShane Hudnall MD   Encounter Date: 03/09/2019  PT End of Session - 03/09/19 1531    Visit Number  5    Number of Visits  13    Date for PT Re-Evaluation  04/04/19    Authorization Type  BCBS    PT Start Time  1531    PT Stop Time  1635    PT Time Calculation (min)  64 min       Past Medical History:  Diagnosis Date  . Allergy   . Asthma     Past Surgical History:  Procedure Laterality Date  . OPEN REDUCTION INTERNAL FIXATION (ORIF) HAND Left 2017    There were no vitals filed for this visit.  Subjective Assessment - 03/09/19 1533    Subjective  "I am feeling alittle sore today"    Patient Stated Goals  playing basketball, return to running and exercise with no pain.     Currently in Pain?  Yes    Pain Score  3     Pain Orientation  Left    Pain Descriptors / Indicators  Aching    Pain Type  Chronic pain    Pain Onset  More than a month ago    Pain Frequency  Intermittent    Aggravating Factors   unsure    Pain Relieving Factors  ice, medication         [facility] PT Assessment - 03/09/19 0001      Assessment   Medical Diagnosis  L glute medius strain, L lateral meniscus tear    Referring Provider (PT)  Norton BlizzardShane Hudnall MD                   [facility]Ex[facility] - 03/09/19 0001      Knee/Hip Exercises: Stretches   Active Hamstring Stretch  3 reps;30 seconds   PNF contract/ relax   ITB Stretch  2 reps;30 seconds      Knee/Hip Exercises: Aerobic   Elliptical  L8 x 6 min elevation L6      Knee/Hip Exercises: Plyometrics   Other Plyometric Exercises  4 squat forward/ backward and lateral hops 3 x10. pt required verbal and visual cues to promote landing on bil LE  especially with jumping back and to the L and would land with hips shifted to the R       Knee/Hip Exercises: Standing   Functional Squat  2 sets;10 reps   standing on upside down bosu   Other Standing Knee Exercises  walking lunge 2 x 10     Other Standing Knee Exercises  bulgarian split squat 1 x 15   LLE only     Cryotherapy   Number Minutes Cryotherapy  10 Minutes    Cryotherapy Location  Hip;Knee    Type of Cryotherapy  Ice pack   in R sidelying     Manual Therapy   Soft tissue mobilization  IASTM along patellar and quad tendons, and along rectus femoris with active release techniques          Balance Exercises - 03/09/19 1625      Balance Exercises: Standing   Cone Rotation  Solid surface   tapping x 10, 3 sets gradually moving cone farther away  PT Education - 03/09/19 1631    Education Details  lateral jumping small jumps focusing on landing equally and avoiding shifting hips    Person(s) Educated  Patient    Methods  Explanation;Verbal cues;Demonstration    Comprehension  Verbalized understanding;Verbal cues required;Returned demonstration       PT Short Term Goals - 02/21/19 1700      PT SHORT TERM GOAL #1   Title  pt to be I with inital HEP     Time  3    Period  Weeks    Status  New    Target Date  03/14/19        PT Long Term Goals - 02/21/19 1700      PT LONG TERM GOAL #1   Title  pt to increase LLE strength grossly to 5/5 with no reprot of pain during testing for hip/ knee stability     Time  6    Period  Weeks    Status  New    Target Date  04/04/19      PT LONG TERM GOAL #2   Title  pt to be able to perform SLS balance for >/= 30 seconds with pertubations reporting no pain for stability     Time  6    Period  Weeks    Status  New    Target Date  04/04/19      PT LONG TERM GOAL #3   Title  pt to be able to perform dynamic plyometric activities with no report of pain or instability for functional return to sport    Time  6     Period  Weeks    Status  New    Target Date  04/04/19      PT LONG TERM GOAL #4   Title  pt to be able to perofrm knee extension exercises to tolerance with pain and return to running and general exercise with no limitations per personal goals     Time  6    Period  Weeks    Status  New    Target Date  04/04/19      PT LONG TERM GOAL #5   Title  increase FOTO score to </= 29% limited to demo improvement in function.     Time  6    Period  Weeks    Status  New    Target Date  04/04/19            Plan - 03/09/19 1627    Clinical Impression Statement  pt noted some soreness in the hip and knee today which started today which is likely DOMS from strengthening and plyometrics last session. Continued strengthening progressing to single limb activities utilizing bulgarian split squat. practiced adding lateral small jumps which she did well with but did note some difficulty with jumping back and to the L favoring the LLE and shifting hips to the R. continued ice end of session to calm down soreness end of session.    PT Next Visit Plan  response to DN, STW along glute medius/ TFL strengthening of the knee/ hip, balance training, jump training/ mechanics, progressing lateral movements and single leg quad/ hamstring strengthening.    PT Home Exercise Plan  SLR, sidelying hipabduction, sidelying clam, bridge, ITB stretching. hamstring stretching, lateral band walks, lateral jumping.       Patient will benefit from skilled therapeutic intervention in order to improve the following deficits and impairments:  Visit Diagnosis: 1. Chronic pain of left knee   2. Pain in left hip   3. Muscle weakness (generalized)        Problem List Patient Active Problem List   Diagnosis Date Noted  . History of peptic ulcer 04/30/2018  . Concussion with no loss of consciousness 07/27/2017  . Chronic toe pain, right foot 07/27/2017  . Routine general medical examination at a health care facility  02/01/2017  . EXERCISE INDUCED ASTHMA 07/11/2010  . Allergic rhinitis 01/29/2009   Starr Lake PT, DPT, LAT, ATC  03/09/19  4:33 PM      [place] [facility] [address] [place], [place], [address] Phone: [phone]   Fax:  [phone]  Name: Donna Montgomery MRN: [id number] Date of Birth: 1996/07/28

## 2019-03-14 ENCOUNTER — Other Ambulatory Visit: Payer: Self-pay

## 2019-03-14 ENCOUNTER — Ambulatory Visit: Payer: BC Managed Care – PPO | Admitting: Physical Therapy

## 2019-03-14 ENCOUNTER — Encounter: Payer: Self-pay | Admitting: Physical Therapy

## 2019-03-14 DIAGNOSIS — G8929 Other chronic pain: Secondary | ICD-10-CM

## 2019-03-14 DIAGNOSIS — M6281 Muscle weakness (generalized): Secondary | ICD-10-CM

## 2019-03-14 DIAGNOSIS — M25562 Pain in left knee: Secondary | ICD-10-CM

## 2019-03-14 DIAGNOSIS — M25552 Pain in left hip: Secondary | ICD-10-CM

## 2019-03-14 NOTE — Therapy (Signed)
[place], [place], [address] Phone: [phone]   Fax:  [phone]  Physical Therapy Treatment  Patient Details  Name: Donna Montgomery MRN: [id number] Date of Birth: 1996-01-24 Referring Provider (PT): Karlton Lemon MD   Encounter Date: 03/14/2019  PT End of Session - 03/14/19 1503    Visit Number  6    Number of Visits  13    Date for PT Re-Evaluation  04/04/19    Authorization Type  BCBS    PT Start Time  1503    PT Stop Time  1550    PT Time Calculation (min)  47 min    Activity Tolerance  Patient tolerated treatment well       Past Medical History:  Diagnosis Date  . Allergy   . Asthma     Past Surgical History:  Procedure Laterality Date  . OPEN REDUCTION INTERNAL FIXATION (ORIF) HAND Left 2017    There were no vitals filed for this visit.  Subjective Assessment - 03/14/19 1503    Subjective  "I am doing pretty good, only my hamstring is tight on the outside of my left knee"    Patient Stated Goals  playing basketball, return to running and exercise with no pain.     Currently in Pain?  Yes    Pain Score  0-No pain         [facility] PT Assessment - 03/14/19 0001      Assessment   Medical Diagnosis  L glute medius strain, L lateral meniscus tear    Referring Provider (PT)  Karlton Lemon MD                   [facility] Adult PT Treatment/Exercise - 03/14/19 0001      Knee/Hip Exercises: Stretches   Active Hamstring Stretch  3 reps;30 seconds    ITB Stretch  2 reps;30 seconds   while laying supine     Knee/Hip Exercises: Aerobic   Elliptical  L8 x 4 min elevation L6    Tread Mill  starting at speed 3.0 gradually increasing speed at pt comfort x 5 min      Knee/Hip Exercises: Plyometrics   Bilateral Jumping  2 sets;5 reps    Other Plyometric Exercises  alternating toe taps moving around bosu 1 x 1 min Cw/ CCW    Other Plyometric Exercises  ladder drill 4 x bil lateral  hops (cues to avoid R hip shifting with jumping to the L) forward high knees 4 x increasing speed from 25 to 50% last 2 sets. single leg hops 2 x LLE only      Knee/Hip Exercises: Standing   Other Standing Knee Exercises  bulgarian split squat 2 x 15 LLE only      Knee/Hip Exercises: Sidelying   Hip ABduction  Strengthening;2 sets;15 reps   1 set with CW/CCW with green theraband     Manual Therapy   Manual therapy comments  mtpr along the hamstring x 3     Other Manual Therapy  tack and stretch x 2      Ankle Exercises: Seated   Other Seated Ankle Exercises  4 -way ankle strengthening 1 x 15 ea.             PT Education - 03/14/19 1602    Education Details  ankle strengtheing with green theraband    Person(s) Educated  Patient    Methods  Explanation;Verbal cues  unable to provide handout due to printer not working   Comprehension  Verbalized understanding;Verbal cues required       PT Short Term Goals - 03/14/19 1559      PT SHORT TERM GOAL #1   Title  pt to be I with inital HEP     Time  3    Period  Weeks    Status  Achieved        PT Long Term Goals - 02/21/19 1700      PT LONG TERM GOAL #1   Title  pt to increase LLE strength grossly to 5/5 with no reprot of pain during testing for hip/ knee stability     Time  6    Period  Weeks    Status  New    Target Date  04/04/19      PT LONG TERM GOAL #2   Title  pt to be able to perform SLS balance for >/= 30 seconds with pertubations reporting no pain for stability     Time  6    Period  Weeks    Status  New    Target Date  04/04/19      PT LONG TERM GOAL #3   Title  pt to be able to perform dynamic plyometric activities with no report of pain or instability for functional return to sport    Time  6    Period  Weeks    Status  New    Target Date  04/04/19      PT LONG TERM GOAL #4   Title  pt to be able to perofrm knee extension exercises to tolerance with pain and return to running and general  exercise with no limitations per personal goals     Time  6    Period  Weeks    Status  New    Target Date  04/04/19      PT LONG TERM GOAL #5   Title  increase FOTO score to </= 29% limited to demo improvement in function.     Time  6    Period  Weeks    Status  New    Target Date  04/04/19            Plan - 03/14/19 1556    Clinical Impression Statement  pt is doing very well with physical therapy reporting no pain inthe hip or knee but did not distal bicep femoris tightness. following manual techniques and stretching she reported no stiffness. continued progression of strengthening and aerobic activity she did well with. continuing jumping progression progressing lateral movements bil , and forward movements working into single leg activity. she reported no soreness at the end of the session and declined modalities.    PT Treatment/Interventions  ADLs/Self Care Home Management;Cryotherapy;Electrical Stimulation;Iontophoresis 4mg /ml Dexamethasone;Moist Heat;Ultrasound;Functional mobility training;Therapeutic activities;Therapeutic exercise;Balance training;Dry needling;Passive range of motion;Manual techniques;Taping;Patient/family education    PT Next Visit Plan  response to DN, STW along glute medius/ TFL strengthening of the knee/ hip, balance training, jump training/ mechanics, progressing lateral movements and single leg quad/ hamstring strengthening.    PT Home Exercise Plan  SLR, sidelying hipabduction, sidelying clam, bridge, ITB stretching. hamstring stretching, lateral band walks, lateral jumping.    Consulted and Agree with Plan of Care  Patient       Patient will benefit from skilled therapeutic intervention in order to improve the following deficits and impairments:     Visit Diagnosis: 1. Chronic pain  of left knee   2. Pain in left hip   3. Muscle weakness (generalized)        Problem List Patient Active Problem List   Diagnosis Date Noted  . History of  peptic ulcer 04/30/2018  . Concussion with no loss of consciousness 07/27/2017  . Chronic toe pain, right foot 07/27/2017  . Routine general medical examination at a health care facility 02/01/2017  . EXERCISE INDUCED ASTHMA 07/11/2010  . Allergic rhinitis 01/29/2009   Lulu RidingKristoffer Dagmar Adcox PT, DPT, LAT, ATC  03/14/19  4:03 PM      [facility] [address] [place], [place], [address] Phone: [phone]   Fax:  [phone]  Name: Donna Montgomery MRN: [id number] Date of Birth: [birth year]

## 2019-03-15 ENCOUNTER — Encounter: Payer: Self-pay | Admitting: Family Medicine

## 2019-03-15 ENCOUNTER — Ambulatory Visit (INDEPENDENT_AMBULATORY_CARE_PROVIDER_SITE_OTHER): Payer: BC Managed Care – PPO | Admitting: Family Medicine

## 2019-03-15 VITALS — BP 100/64

## 2019-03-15 DIAGNOSIS — M25562 Pain in left knee: Secondary | ICD-10-CM | POA: Diagnosis not present

## 2019-03-15 DIAGNOSIS — G8929 Other chronic pain: Secondary | ICD-10-CM | POA: Diagnosis not present

## 2019-03-15 NOTE — Progress Notes (Signed)
   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 MRI from [place] reviewed that day which was notable for intact ligaments, positive effusion, and no apparent minsucs tear. She has been undergoing PT and conservative treatment. She was told some of her pain was coming from her tight hamstring. When they are stretched and loosened, the knee pain improves. Pain has improved. She does note some soreness after rehab, otherwise feels like it is mostly healed.   She also had some left hip pain from her fall as well. Thought to be secondary to compensatory gluteus medius strain. She notes much improvement. Some intermittent pain during strenuous therapy but it quickly resolves. She is very happy with the overall treatment and is feeling much better.   See HPI and/or previous note for associated ROS.  Objective: BP 100/64  Gen: NAD, well groomed, comfortable in exam room CV: Well-perfused. Warm.  Resp: Non-labored.  Neuro: Awake and alert. Speech normal   Left/right Hip: No deformity. FROM with 5/5 strength  Nontender to palpation on full inspection  NVI distally Negative FADIR and FABER  Left/Right knee: No deformity. FROM with 5/5 strength. No tenderness to palpation. NVI distally. Negative anterior/posteror drawer. Negative valgus/varus. Negative lachmans. Negative mcMurrays, apleys, and patellar apprehension.   Assessment and plan: Left knee pain, improved: Appears knee is healing well with physical therapy and conservative treatment. Patient to complete PT as scheduled with recommendation to continue home exercises QOD for 4-6 weeks. RTC if pain returns or new concerns.  Left hip pain, resolved - continue PT as scheduled. RTC if pain recurs or new concerns  Mina Marble, [place], PGY2 03/15/2019 1:40 PM

## 2019-03-16 ENCOUNTER — Other Ambulatory Visit: Payer: Self-pay

## 2019-03-16 ENCOUNTER — Ambulatory Visit: Payer: BC Managed Care – PPO | Attending: Family Medicine | Admitting: Physical Therapy

## 2019-03-16 ENCOUNTER — Encounter: Payer: Self-pay | Admitting: Physical Therapy

## 2019-03-16 DIAGNOSIS — G8929 Other chronic pain: Secondary | ICD-10-CM | POA: Diagnosis present

## 2019-03-16 DIAGNOSIS — M6281 Muscle weakness (generalized): Secondary | ICD-10-CM | POA: Insufficient documentation

## 2019-03-16 DIAGNOSIS — M25552 Pain in left hip: Secondary | ICD-10-CM

## 2019-03-16 DIAGNOSIS — M25562 Pain in left knee: Secondary | ICD-10-CM | POA: Diagnosis present

## 2019-03-16 NOTE — Therapy (Signed)
[facility] [address] [place], [place], [address] Phone: [phone]   Fax:  [phone]  Physical Therapy Treatment  Patient Details  Name: Donna Montgomery MRN: [id number] Date of Birth: 04/25/1996 Referring Provider (PT): Norton BlizzardShane Hudnall MD   Encounter Date: 03/16/2019  PT End of Session - 03/16/19 1435    Visit Number  7    Number of Visits  13    Date for PT Re-Evaluation  04/04/19    Authorization Type  BCBS    PT Start Time  1435    PT Stop Time  1515    PT Time Calculation (min)  40 min    Activity Tolerance  Patient tolerated treatment well    Behavior During Therapy  [facility] for tasks assessed/performed       Past Medical History:  Diagnosis Date  . Allergy   . Asthma     Past Surgical History:  Procedure Laterality Date  . OPEN REDUCTION INTERNAL FIXATION (ORIF) HAND Left 2017    There were no vitals filed for this visit.  Subjective Assessment - 03/16/19 1438    Subjective  "I am doing pretty no pain, Saw the MD and they released me. I think I am doing well but would like ot continue with jumping progress and strengtheing.    Currently in Pain?  No/denies    Aggravating Factors   N/A                       [facility] Adult PT Treatment/Exercise - 03/16/19 0001      Knee/Hip Exercises: Stretches   Active Hamstring Stretch  2 reps;30 seconds;Left    ITB Stretch  2 reps;30 seconds;Left    Gastroc Stretch  2 reps;Both;30 seconds   on slant board     Knee/Hip Exercises: Aerobic   Tread Mill  L4.5 x 5 min       Knee/Hip Exercises: Machines for Strengthening   Hip Cybex  bil hip abductin 2 x bil going to fatigue37.5#      Knee/Hip Exercises: Plyometrics   Box Circuit Limitations  modified box jump (2nd step in back) x 5 jumping off, landing focusing on bil knee bend allow for recoil and jumping for height    Broad Jump  2 sets;5 reps    Other Plyometric Exercises  alternating forward/lateral  bounding landing on single leg 2 x 30 ft, high knees 2 x 30 ft 1 for speed, 1 for height.    Other Plyometric Exercises  lateral shuffling 3 x 30 ft, starting 25% speed, progressing to 75% and finishing with max speed, [place] 2 x 30 ft 25%  and 50% max speed      Knee/Hip Exercises: Standing   Other Standing Knee Exercises  single leg sit to stand from elevated table 1 x 10, lowering table 1 x 10               PT Short Term Goals - 03/14/19 1559      PT SHORT TERM GOAL #1   Title  pt to be I with inital HEP     Time  3    Period  Weeks    Status  Achieved        PT Long Term Goals - 02/21/19 1700      PT LONG TERM GOAL #1   Title  pt to increase LLE strength grossly to 5/5 with no reprot of pain during testing  for hip/ knee stability     Time  6    Period  Weeks    Status  New    Target Date  04/04/19      PT LONG TERM GOAL #2   Title  pt to be able to perform SLS balance for >/= 30 seconds with pertubations reporting no pain for stability     Time  6    Period  Weeks    Status  New    Target Date  04/04/19      PT LONG TERM GOAL #3   Title  pt to be able to perform dynamic plyometric activities with no report of pain or instability for functional return to sport    Time  6    Period  Weeks    Status  New    Target Date  04/04/19      PT LONG TERM GOAL #4   Title  pt to be able to perofrm knee extension exercises to tolerance with pain and return to running and general exercise with no limitations per personal goals     Time  6    Period  Weeks    Status  New    Target Date  04/04/19      PT LONG TERM GOAL #5   Title  increase FOTO score to </= 29% limited to demo improvement in function.     Time  6    Period  Weeks    Status  New    Target Date  04/04/19            Plan - 03/16/19 1525    Clinical Impression Statement  continued working on jump progression from forward/ lateral double to single limb. She performed all exercises , she does  exhibit hesitation with new exercises but improves with comfort following a few reps. plan to continue with progression of dynamic movements working on quick changes in direction and other basketball related drills    PT Treatment/Interventions  ADLs/Self Care Home Management;Cryotherapy;Electrical Stimulation;Iontophoresis 4mg /ml Dexamethasone;Moist Heat;Ultrasound;Functional mobility training;Therapeutic activities;Therapeutic exercise;Balance training;Dry needling;Passive range of motion;Manual techniques;Taping;Patient/family education    PT Next Visit Plan  DN PRN,  challenge balance, jump training/ mechanics, running/ cutting and quick changes in direction, progressing lateral movements and SL strengthening.    PT Home Exercise Plan  SLR, sidelying hipabduction, sidelying clam, bridge, ITB stretching. hamstring stretching, lateral band walks, lateral jumping.    Consulted and Agree with Plan of Care  Patient       Patient will benefit from skilled therapeutic intervention in order to improve the following deficits and impairments:     Visit Diagnosis: 1. Chronic pain of left knee   2. Pain in left hip   3. Muscle weakness (generalized)        Problem List Patient Active Problem List   Diagnosis Date Noted  . History of peptic ulcer 04/30/2018  . Concussion with no loss of consciousness 07/27/2017  . Chronic toe pain, right foot 07/27/2017  . Routine general medical examination at a health care facility 02/01/2017  . EXERCISE INDUCED ASTHMA 07/11/2010  . Allergic rhinitis 01/29/2009   Donna Montgomery PT, DPT, LAT, ATC  03/16/19  3:31 PM      [facility] [address] [place], [place], [address] Phone: [phone]   Fax:  [phone]  Name: Donna Montgomery MRN: [id number] Date of Birth: October 29, 1995

## 2019-03-20 ENCOUNTER — Encounter: Payer: Self-pay | Admitting: Family Medicine

## 2019-03-21 ENCOUNTER — Ambulatory Visit: Payer: BC Managed Care – PPO | Admitting: Physical Therapy

## 2019-03-21 DIAGNOSIS — M25552 Pain in left hip: Secondary | ICD-10-CM

## 2019-03-21 DIAGNOSIS — M6281 Muscle weakness (generalized): Secondary | ICD-10-CM

## 2019-03-21 DIAGNOSIS — M25562 Pain in left knee: Secondary | ICD-10-CM | POA: Diagnosis not present

## 2019-03-21 DIAGNOSIS — G8929 Other chronic pain: Secondary | ICD-10-CM

## 2019-03-21 NOTE — Therapy (Signed)
[facility] [address] [place], [place], [address] Phone: [phone]   Fax:  [phone]  Physical Therapy Treatment  Patient Details  Name: Donna Montgomery MRN: [id number] Date of Birth: 05-Jun-1996 Referring Provider (PT): Norton BlizzardShane Hudnall MD   Encounter Date: 03/21/2019  PT End of Session - 03/21/19 1610    Visit Number  8    Number of Visits  13    Date for PT Re-Evaluation  04/04/19    Authorization Type  BCBS    PT Start Time  1505    PT Stop Time  1600   10 min on ice   PT Time Calculation (min)  55 min    Activity Tolerance  Patient tolerated treatment well    Behavior During Therapy  [facility] for tasks assessed/performed       Past Medical History:  Diagnosis Date  . Allergy   . Asthma     Past Surgical History:  Procedure Laterality Date  . OPEN REDUCTION INTERNAL FIXATION (ORIF) HAND Left 2017    There were no vitals filed for this visit.  Subjective Assessment - 03/21/19 1603    Subjective  no pain today and was able to workout some on basketball drills before her session    Limitations  Walking;Standing;Other (comment);Sitting   running   How long can you sit comfortably?  90 min     How long can you stand comfortably?  30 min     How long can you walk comfortably?  15-30 min    [place]    Patient Stated Goals  playing basketball, return to running and exercise with no pain.     Currently in Pain?  No/denies    Pain Onset  More than a month ago    Pain Onset  More than a month ago      Therex, agility and plyometric activity performed today:  Recumbent bike warm up 5 min L3  agillity ladder 5 min multiple combinations of quick feet and hopping,   jumping up to 2nd step outside X 20 landing on both feet,   then from 2nd step bulgarian split squat X 15 bilat progressed to adding mini jump at top  x10 bilat.   depth jumps vertical and dept jumps broad  x10 ea from 2nd step outside,    box drill running outside in parking lot for change of direction, acceleration and deceleration X 3 CW and CCW,   Quick vertical jumping X 15 on Lt leg then X 15 bilat vertical jump.  Finished with game ready for ice and compression X 10 min after session.   PT Short Term Goals - 03/14/19 1559      PT SHORT TERM GOAL #1   Title  pt to be I with inital HEP     Time  3    Period  Weeks    Status  Achieved        PT Long Term Goals - 02/21/19 1700      PT LONG TERM GOAL #1   Title  pt to increase LLE strength grossly to 5/5 with no reprot of pain during testing for hip/ knee stability     Time  6    Period  Weeks    Status  New    Target Date  04/04/19      PT LONG TERM GOAL #2   Title  pt to be able to perform SLS balance  for >/= 30 seconds with pertubations reporting no pain for stability     Time  6    Period  Weeks    Status  New    Target Date  04/04/19      PT LONG TERM GOAL #3   Title  pt to be able to perform dynamic plyometric activities with no report of pain or instability for functional return to sport    Time  6    Period  Weeks    Status  New    Target Date  04/04/19      PT LONG TERM GOAL #4   Title  pt to be able to perofrm knee extension exercises to tolerance with pain and return to running and general exercise with no limitations per personal goals     Time  6    Period  Weeks    Status  New    Target Date  04/04/19      PT LONG TERM GOAL #5   Title  increase FOTO score to </= 29% limited to demo improvement in function.     Time  6    Period  Weeks    Status  New    Target Date  04/04/19            Plan - 03/21/19 1604    Clinical Impression Statement  Session focused on agillity, jumping, change of direction, and plyometric activity with great tolerance and no complaints of hip/knee pain. She does lack some conditioning and fatigued with plyometric activity however she had also worked out some prior to her session. She appears to be  gaining some confidence back with her knee.    PT Treatment/Interventions  ADLs/Self Care Home Management;Cryotherapy;Electrical Stimulation;Iontophoresis 4mg /ml Dexamethasone;Moist Heat;Ultrasound;Functional mobility training;Therapeutic activities;Therapeutic exercise;Balance training;Dry needling;Passive range of motion;Manual techniques;Taping;Patient/family education    PT Next Visit Plan  DN PRN,  challenge balance, jump training/ mechanics, running/ cutting and quick changes in direction, progressing lateral movements and SL strengthening.    PT Home Exercise Plan  SLR, sidelying hipabduction, sidelying clam, bridge, ITB stretching. hamstring stretching, lateral band walks, lateral jumping.    Consulted and Agree with Plan of Care  Patient       Patient will benefit from skilled therapeutic intervention in order to improve the following deficits and impairments:     Visit Diagnosis: 1. Chronic pain of left knee   2. Pain in left hip   3. Muscle weakness (generalized)        Problem List Patient Active Problem List   Diagnosis Date Noted  . History of peptic ulcer 04/30/2018  . Concussion with no loss of consciousness 07/27/2017  . Chronic toe pain, right foot 07/27/2017  . Routine general medical examination at a health care facility 02/01/2017  . EXERCISE INDUCED ASTHMA 07/11/2010  . Allergic rhinitis 01/29/2009    Silvestre Mesi 03/21/2019, 4:13 PM  [facility] [address] [place], [place], [address] Phone: [phone]   Fax:  [phone]  Name: Donna Montgomery MRN: [id number] Date of Birth: 1996/01/20

## 2019-03-23 ENCOUNTER — Ambulatory Visit: Payer: BC Managed Care – PPO | Admitting: Physical Therapy

## 2019-03-23 ENCOUNTER — Other Ambulatory Visit: Payer: Self-pay

## 2019-03-23 DIAGNOSIS — M25562 Pain in left knee: Secondary | ICD-10-CM

## 2019-03-23 DIAGNOSIS — M6281 Muscle weakness (generalized): Secondary | ICD-10-CM

## 2019-03-23 DIAGNOSIS — G8929 Other chronic pain: Secondary | ICD-10-CM

## 2019-03-23 NOTE — Therapy (Signed)
[facility] [address] [place], [place], [address] Phone: [phone]   Fax:  [phone]  Physical Therapy Treatment  Patient Details  Name: Donna Montgomery MRN: [id number] Date of Birth: [birth year] Referring Provider (PT): Norton BlizzardShane Hudnall MD   Encounter Date: 03/23/2019  PT End of Session - 03/23/19 1534    Visit Number  9    Number of Visits  13    Date for PT Re-Evaluation  04/04/19    Authorization Type  BCBS    PT Start Time  1534    PT Stop Time  1633    PT Time Calculation (min)  59 min    Activity Tolerance  Patient tolerated treatment well;Patient limited by pain    Behavior During Therapy  [facility] for tasks assessed/performed       Past Medical History:  Diagnosis Date  . Allergy   . Asthma     Past Surgical History:  Procedure Laterality Date  . OPEN REDUCTION INTERNAL FIXATION (ORIF) HAND Left 2017    There were no vitals filed for this visit.  Subjective Assessment - 03/23/19 1537    Subjective  no pain today just normal muscle soreness. left quads and hip tight from basketball workouts.    Patient Stated Goals  playing basketball, return to running and exercise with no pain.     Currently in Pain?  No/denies            Therex, agility and plyometric activity performed today:  Recumbent bike warm up 5 min L4  agillity ladder 5 min multiple combinations of quick feet and hopping,   jumping up to 2nd step outside X 20 landing on both feet,   then from 2nd step bulgarian split squat X 15 bilat attempted mini jump at top but she had pain in left knee.  Attempted depth jumps vertical but knee painful.          [facility] Adult PT Treatment/Exercise - 03/23/19 0001      Manual Therapy   Manual Therapy  Soft tissue mobilization;Myofascial release    Soft tissue mobilization  to left quadriceps    Myofascial Release  to left ITB       Trigger Point Dry Needling - 03/23/19 0001    Consent  Given?  Yes    Education Handout Provided  Previously provided    Muscles Treated Lower Quadrant  Quadriceps    Electrical Stimulation Performed with Dry Needling  Yes    E-stim with Dry Needling Details  IFC set-up, frequency at 2, channel 1 and 2 bil first dot   x 10 min   Quadriceps Response  Twitch response elicited;Palpable increased muscle length             PT Short Term Goals - 03/14/19 1559      PT SHORT TERM GOAL #1   Title  pt to be I with inital HEP     Time  3    Period  Weeks    Status  Achieved        PT Long Term Goals - 02/21/19 1700      PT LONG TERM GOAL #1   Title  pt to increase LLE strength grossly to 5/5 with no reprot of pain during testing for hip/ knee stability     Time  6    Period  Weeks    Status  New    Target Date  04/04/19  PT LONG TERM GOAL #2   Title  pt to be able to perform SLS balance for >/= 30 seconds with pertubations reporting no pain for stability     Time  6    Period  Weeks    Status  New    Target Date  04/04/19      PT LONG TERM GOAL #3   Title  pt to be able to perform dynamic plyometric activities with no report of pain or instability for functional return to sport    Time  6    Period  Weeks    Status  New    Target Date  04/04/19      PT LONG TERM GOAL #4   Title  pt to be able to perofrm knee extension exercises to tolerance with pain and return to running and general exercise with no limitations per personal goals     Time  6    Period  Weeks    Status  New    Target Date  04/04/19      PT LONG TERM GOAL #5   Title  increase FOTO score to </= 29% limited to demo improvement in function.     Time  6    Period  Weeks    Status  New    Target Date  04/04/19            Plan - 03/23/19 1640    Clinical Impression Statement  We focused again on agility and jumping but pt had pain in left knee with jumping. Pt had increased tone and TPs in lateral quads. Great response to DN/estim and reported no  pain when she left.    PT Treatment/Interventions  ADLs/Self Care Home Management;Cryotherapy;Electrical Stimulation;Iontophoresis 4mg /ml Dexamethasone;Moist Heat;Ultrasound;Functional mobility training;Therapeutic activities;Therapeutic exercise;Balance training;Dry needling;Passive range of motion;Manual techniques;Taping;Patient/family education    PT Next Visit Plan  DN PRN,  challenge balance, jump training/ mechanics, running/ cutting and quick changes in direction, progressing lateral movements and SL strengthening.       Patient will benefit from skilled therapeutic intervention in order to improve the following deficits and impairments:  Pain, Increased fascial restricitons, Improper body mechanics, Postural dysfunction, Decreased strength, Decreased balance, Decreased activity tolerance, Decreased endurance  Visit Diagnosis: 1. Chronic pain of left knee   2. Muscle weakness (generalized)        Problem List Patient Active Problem List   Diagnosis Date Noted  . History of peptic ulcer 04/30/2018  . Concussion with no loss of consciousness 07/27/2017  . Chronic toe pain, right foot 07/27/2017  . Routine general medical examination at a health care facility 02/01/2017  . EXERCISE INDUCED ASTHMA 07/11/2010  . Allergic rhinitis 01/29/2009   Madelyn Flavors PT 03/23/2019, 5:10 PM  [facility] [address] [place], [place], [address] Phone: [phone]   Fax:  [phone]  Name: Donna Montgomery MRN: [id number] Date of Birth: 1996/06/29

## 2019-03-28 ENCOUNTER — Other Ambulatory Visit: Payer: Self-pay

## 2019-03-28 ENCOUNTER — Encounter: Payer: Self-pay | Admitting: Physical Therapy

## 2019-03-28 ENCOUNTER — Ambulatory Visit: Payer: BC Managed Care – PPO | Admitting: Physical Therapy

## 2019-03-28 DIAGNOSIS — M25552 Pain in left hip: Secondary | ICD-10-CM

## 2019-03-28 DIAGNOSIS — G8929 Other chronic pain: Secondary | ICD-10-CM

## 2019-03-28 DIAGNOSIS — M6281 Muscle weakness (generalized): Secondary | ICD-10-CM

## 2019-03-28 DIAGNOSIS — M25562 Pain in left knee: Secondary | ICD-10-CM | POA: Diagnosis not present

## 2019-03-28 NOTE — Therapy (Signed)
[place], [place], [address] Phone: [phone]   Fax:  [phone]  Physical Therapy Treatment  Patient Details  Name: Donna Montgomery MRN: [id number] Date of Birth: 19-Jan-1996 Referring Provider (PT): Karlton Lemon MD   Encounter Date: 03/28/2019  PT End of Session - 03/28/19 1536    Visit Number  10    Number of Visits  13    Date for PT Re-Evaluation  04/04/19    PT Start Time  0245    PT Stop Time  0345    PT Time Calculation (min)  60 min       Past Medical History:  Diagnosis Date  . Allergy   . Asthma     Past Surgical History:  Procedure Laterality Date  . OPEN REDUCTION INTERNAL FIXATION (ORIF) HAND Left 2017    There were no vitals filed for this visit.  Subjective Assessment - 03/28/19 1535    Subjective  Sore in lateral thigh. No pain.    Currently in Pain?  No/denies                       [facility] Adult PT Treatment/Exercise - 03/28/19 0001      Knee/Hip Exercises: Aerobic   Tread Mill  L4.5 x 5 min       Manual Therapy   Manual Therapy  Soft tissue mobilization;Myofascial release    Soft tissue mobilization  to left quadriceps    Myofascial Release  to left ITB     Therex, agility and plyometric activity performed today:  Recumbent bike warm up 5 min L3  jumping up to 2nd step outside X 20 landing on both feet,   then from 2nd step bulgarian split squat X 15 bilat  box drill running outside in parking lot for change of direction, acceleration and deceleration X 3 CW and CCW, one episode of pain during clockwise slides   Quick vertical jumping X 15 on Lt leg then X 15 bilat vertical jump.  Finished with game ready for ice and compression X 15 min after session       Trigger Point Dry Needling - 03/28/19 0001    Consent Given?  Yes    Education Handout Provided  Previously provided    Muscles Treated Lower Quadrant  Quadriceps    Quadriceps Response  Twitch response elicited;Palpable increased muscle length             PT Short Term Goals - 03/14/19 1559      PT SHORT TERM GOAL #1   Title  pt to be I with inital HEP     Time  3    Period  Weeks    Status  Achieved        PT Long Term Goals - 02/21/19 1700      PT LONG TERM GOAL #1   Title  pt to increase LLE strength grossly to 5/5 with no reprot of pain during testing for hip/ knee stability     Time  6    Period  Weeks    Status  New    Target Date  04/04/19      PT LONG TERM GOAL #2   Title  pt to be able to perform SLS balance for >/= 30 seconds with pertubations reporting no pain for stability     Time  6    Period  Weeks    Status  New  Target Date  04/04/19      PT LONG TERM GOAL #3   Title  pt to be able to perform dynamic plyometric activities with no report of pain or instability for functional return to sport    Time  6    Period  Weeks    Status  New    Target Date  04/04/19      PT LONG TERM GOAL #4   Title  pt to be able to perofrm knee extension exercises to tolerance with pain and return to running and general exercise with no limitations per personal goals     Time  6    Period  Weeks    Status  New    Target Date  04/04/19      PT LONG TERM GOAL #5   Title  increase FOTO score to </= 29% limited to demo improvement in function.     Time  6    Period  Weeks    Status  New    Target Date  04/04/19            Plan - 03/28/19 1542    Clinical Impression Statement  Pt arrived with Lateral quad soreness. PT Anne NgKris Leamon performed TPDN to lateral quad and then PTA continued with agility drills. She had one episode of  laterla right knee pain with box drills when performing slides to the right however pain subsided. She wanted to work on dynamic lunges and did well.    PT Next Visit Plan  ERO; DN PRN,  challenge balance, jump training/ mechanics, running/ cutting and quick changes in direction, progressing lateral  movements and SL strengthening.    PT Home Exercise Plan  SLR, sidelying hipabduction, sidelying clam, bridge, ITB stretching. hamstring stretching, lateral band walks, lateral jumping.       Patient will benefit from skilled therapeutic intervention in order to improve the following deficits and impairments:  Pain, Increased fascial restricitons, Improper body mechanics, Postural dysfunction, Decreased strength, Decreased balance, Decreased activity tolerance, Decreased endurance  Visit Diagnosis: 1. Muscle weakness (generalized)   2. Chronic pain of left knee   3. Pain in left hip        Problem List Patient Active Problem List   Diagnosis Date Noted  . History of peptic ulcer 04/30/2018  . Concussion with no loss of consciousness 07/27/2017  . Chronic toe pain, right foot 07/27/2017  . Routine general medical examination at a health care facility 02/01/2017  . EXERCISE INDUCED ASTHMA 07/11/2010  . Allergic rhinitis 01/29/2009    Sherrie Mustacheonoho, Tien Aispuro McGee, PTA 03/28/2019, 3:59 PM  [facility] [address] [place], [place], [address] Phone: [phone]   Fax:  [phone]  Name: Donna Gashkela L Ledee MRN: [id number] Date of Birth: Jun 27, 1996

## 2019-04-05 ENCOUNTER — Encounter: Payer: Self-pay | Admitting: Physical Therapy

## 2019-04-05 ENCOUNTER — Other Ambulatory Visit: Payer: Self-pay

## 2019-04-05 ENCOUNTER — Ambulatory Visit: Payer: BC Managed Care – PPO | Admitting: Physical Therapy

## 2019-04-05 DIAGNOSIS — M25562 Pain in left knee: Secondary | ICD-10-CM | POA: Diagnosis not present

## 2019-04-05 DIAGNOSIS — M25552 Pain in left hip: Secondary | ICD-10-CM

## 2019-04-05 DIAGNOSIS — G8929 Other chronic pain: Secondary | ICD-10-CM

## 2019-04-05 DIAGNOSIS — M6281 Muscle weakness (generalized): Secondary | ICD-10-CM

## 2019-04-05 NOTE — Therapy (Signed)
[place], [place], [address] Phone: [phone]   Fax:  [phone]  Physical Therapy Treatment / Discharge Summary  Patient Details  Name: Donna Montgomery MRN: [id number] Date of Birth: August 20, 1996 Referring Provider (PT): Karlton Lemon MD   Encounter Date: 04/05/2019  PT End of Session - 04/05/19 1103    Visit Number  11    Number of Visits  13    Date for PT Re-Evaluation  04/05/19    PT Start Time  1103    PT Stop Time  1144    PT Time Calculation (min)  41 min    Activity Tolerance  Patient tolerated treatment well;Patient limited by pain       Past Medical History:  Diagnosis Date  . Allergy   . Asthma     Past Surgical History:  Procedure Laterality Date  . OPEN REDUCTION INTERNAL FIXATION (ORIF) HAND Left 2017    There were no vitals filed for this visit.  Subjective Assessment - 04/05/19 1105    Subjective  "I've been doing work on basketball workouts, with no issues"    Patient Stated Goals  playing basketball, return to running and exercise with no pain.          [facility] PT Assessment - 04/05/19 0001      Assessment   Medical Diagnosis  L glute medius strain, L lateral meniscus tear    Referring Provider (PT)  Karlton Lemon MD      Strength   Left Hip Flexion  5/5    Left Hip Extension  5/5    Left Hip ABduction  5/5                   [facility] Adult PT Treatment/Exercise - 04/05/19 0001      Knee/Hip Exercises: Aerobic   Tread Mill  L4.5 x 5 min       Knee/Hip Exercises: Plyometrics   Other Plyometric Exercises  back / lateral stepping/ shuffle and turning and sprinting 3 x  to the L/R    Other Plyometric Exercises  mimicking lay-up going to to the L/ R pushing off single leg      Knee/Hip Exercises: Standing   SLS  on bosu 1 x 30 sec, 1 x 10 single leg squat    Other Standing Knee Exercises  1 x 20, LLE only    Other Standing Knee Exercises  latera lband  walks 2 x 40 ft, with black theraband             PT Education - 04/05/19 1144    Education Details  reviewed previously provided HEP and how to progress with strengthening with increased reps/ sets and to perform exercises bil.    Person(s) Educated  Patient    Methods  Explanation;Verbal cues;Handout    Comprehension  Verbalized understanding;Verbal cues required       PT Short Term Goals - 04/05/19 1106      PT SHORT TERM GOAL #1   Title  pt to be I with inital HEP     Time  3    Period  Weeks    Status  Achieved        PT Long Term Goals - 04/05/19 1106      PT LONG TERM GOAL #1   Title  pt to increase LLE strength grossly to 5/5 with no reprot of pain during testing for hip/ knee stability  Status  Achieved      PT LONG TERM GOAL #2   Title  pt to be able to perform SLS balance for >/= 30 seconds with pertubations reporting no pain for stability     Period  Weeks    Status  Achieved      PT LONG TERM GOAL #3   Title  pt to be able to perform dynamic plyometric activities with no report of pain or instability for functional return to sport    Status  Achieved      PT LONG TERM GOAL #4   Title  pt to be able to perofrm knee extension exercises to tolerance with pain and return to running and general exercise with no limitations per personal goals     Period  Weeks    Status  Achieved      PT LONG TERM GOAL #5   Title  increase FOTO score to </= 29% limited to demo improvement in function.     Status  Achieved            Plan - 04/05/19 1145    Clinical Impression Statement  Kristina has made great progress with PT increased hip strength and additionally reports no pain. she is able to perform running/ jumping and sport specific activities with no report of pain or limitation. She met all goals today and is able to maintain and progress her current level of function independently and will be discharged from PT today.    PT Treatment/Interventions   ADLs/Self Care Home Management;Cryotherapy;Electrical Stimulation;Iontophoresis '4mg'$ /ml Dexamethasone;Moist Heat;Ultrasound;Functional mobility training;Therapeutic activities;Therapeutic exercise;Balance training;Dry needling;Passive range of motion;Manual techniques;Taping;Patient/family education    PT Next Visit Plan  D/C    PT Home Exercise Plan  SLR, sidelying hipabduction, sidelying clam, bridge, ITB stretching. hamstring stretching, lateral band walks, lateral jumping.       Patient will benefit from skilled therapeutic intervention in order to improve the following deficits and impairments:  Pain, Increased fascial restricitons, Improper body mechanics, Postural dysfunction, Decreased strength, Decreased balance, Decreased activity tolerance, Decreased endurance  Visit Diagnosis: 1. Muscle weakness (generalized)   2. Chronic pain of left knee   3. Pain in left hip        Problem List Patient Active Problem List   Diagnosis Date Noted  . History of peptic ulcer 04/30/2018  . Concussion with no loss of consciousness 07/27/2017  . Chronic toe pain, right foot 07/27/2017  . Routine general medical examination at a health care facility 02/01/2017  . EXERCISE INDUCED ASTHMA 07/11/2010  . Allergic rhinitis 01/29/2009   Starr Lake PT, DPT, LAT, ATC  04/05/19  11:48 AM      [facility] [address] [place], [place], [address] Phone: [phone]   Fax:  [phone]  Name: Donna Montgomery MRN: [id number] Date of Birth: [birth year]      PHYSICAL THERAPY DISCHARGE SUMMARY  Visits from Start of Care: 11  Current functional level related to goals / functional outcomes: See goals, FOTO 9% limited   Remaining deficits: N/A   Education / Equipment: HEP, theraband, posture, jump mechanics,   Plan: Patient agrees to discharge.  Patient goals were met. Patient is being discharged due to meeting the stated rehab  goals.  ?????         Charne Mcbrien PT, DPT, LAT, ATC  04/05/19  11:48 AM

## 2019-04-06 ENCOUNTER — Telehealth: Payer: Self-pay | Admitting: *Deleted

## 2019-04-06 ENCOUNTER — Encounter: Payer: Self-pay | Admitting: Family Medicine

## 2019-04-06 ENCOUNTER — Ambulatory Visit (INDEPENDENT_AMBULATORY_CARE_PROVIDER_SITE_OTHER): Payer: BC Managed Care – PPO | Admitting: Family Medicine

## 2019-04-06 DIAGNOSIS — U071 COVID-19: Secondary | ICD-10-CM | POA: Diagnosis not present

## 2019-04-06 DIAGNOSIS — K625 Hemorrhage of anus and rectum: Secondary | ICD-10-CM

## 2019-04-06 DIAGNOSIS — K5900 Constipation, unspecified: Secondary | ICD-10-CM

## 2019-04-06 NOTE — Telephone Encounter (Signed)
I left a detailed message for the Donna Montgomery to call back for an appt with Dr Martinique, Dr Volanda Napoleon, Dr Ethlyn Gallery or Dr Jerilee Hoh.

## 2019-04-06 NOTE — Progress Notes (Signed)
Virtual Visit via Video Note  I connected with Donna Montgomery  on 04/06/19 at 12:40 PM EDT by a video enabled telemedicine application and verified that I am speaking with the correct person using two identifiers.  Location patient: home Location provider:work or home office Persons participating in the virtual visit: patient, provider  I discussed the limitations of evaluation and management by telemedicine and the availability of in person appointments. The patient expressed understanding and agreed to proceed.   HPI:  Acute visit for Constipation: -started about 4-5 days ago -symptoms: straining, hard balls, abd fullness, will have urge to go - but then only a little will come out -she has had a little blood on the TP a few times -she tried a self disimpaction with her finger -she tried a laxative over the counter and pepto pismal  -denies fevers, nausea, vomiting, diarrhea, abd pain -FDLMP July 5th -Temp  -Hx of PUD, hospitalized remotely for that -she has no symptoms of COVID19 or known exposure, she did just get tested for COVID19 though because she went to the beach, she saw on the news to get tested if went to the beach.   ROS: See pertinent positives and negatives per HPI.  Past Medical History:  Diagnosis Date  . Allergy   . Asthma     Past Surgical History:  Procedure Laterality Date  . OPEN REDUCTION INTERNAL FIXATION (ORIF) HAND Left 2017    Family History  Problem Relation Age of Onset  . Asthma Other     SOCIAL HX: see hpi   Current Outpatient Medications:  .  adapalene (DIFFERIN) 0.1 % cream, Apply topically at bedtime., Disp: , Rfl:  .  albuterol (PROVENTIL HFA;VENTOLIN HFA) 108 (90 Base) MCG/ACT inhaler, Inhale 1 puff into the lungs every 6 (six) hours as needed. For wheezing/ and exercise induced asthma, Disp: 1 Inhaler, Rfl: 2  EXAM:  VITALS per patient if applicable:  GENERAL: alert, oriented, appears well and in no acute distress  HEENT:  atraumatic, conjunttiva clear, no obvious abnormalities on inspection of external nose and ears  NECK: normal movements of the head and neck  LUNGS: on inspection no signs of respiratory distress, breathing rate appears normal, no obvious gross SOB, gasping or wheezing  CV: no obvious cyanosis  MS: moves all visible extremities without noticeable abnormality  PSYCH/NEURO: pleasant and cooperative, no obvious depression or anxiety, speech and thought processing grossly intact  ASSESSMENT AND PLAN:  Discussed the following assessment and plan: More than 50% of over 25 minutes spent in total in caring for this patient was spent  counseling and/or coordinating care.    Constipation, unspecified constipation type  BRBPR (bright red blood per rectum)  Testing for COVID19 pending for potential exposure  -we discussed possible serious and likely etiologies, workup and treatment, treatment risks and return precautions. -after this discussion, [place] opted for bowel regimen - mirilax 1 capful twice daily for 3 days, then 1 capful daily for 7 days or to goal of soft BMs daily. Healthy diet, plenty of water. No further treatments PR as suspect small tear as source of sm amount of bleeding. Did advise am not able to confirm that without in person exam. Advised if any further bleeding, worsening or new concerns will need in person exam at [facility]/or here if neg COVID19 testing. She agreed. -follow up advised in 1-2 weeks/needs NPV as prior PCP retired.     I discussed the assessment and treatment plan with the patient. The patient was  provided an opportunity to ask questions and all were answered. The patient agreed with the plan and demonstrated an understanding of the instructions.    Follow up instructions: Advised assistant Wendie Simmer to help patient arrange the following: -NPV/f/u in 1-2 weeks   Donna Kern, DO   Patient Instructions  Mirilax 1 capful in full cup of water twice daily for 3  days, then 1 capful daily for 1 week to achieve soft bowel movements daily.  No treatments per rectum.  Please seek immediate evaluation at the urgent care if symptoms are severe, you have recurrent or any more then a small amount of blood on TP. Follow up for a virtual visit if any worsening or new concerns prior to your next visit.  Follow up and new patient visit in a 2 weeks. Call our office for this appointment if you have not heard from our office in the next 2-3 business days.

## 2019-04-06 NOTE — Telephone Encounter (Signed)
-----   Message from Lucretia Kern, DO sent at 04/06/2019  1:08 PM EDT ----- -new patient visit with one of our providers taking new patients in 1-2 weeks

## 2019-04-06 NOTE — Patient Instructions (Signed)
Mirilax 1 capful in full cup of water twice daily for 3 days, then 1 capful daily for 1 week to achieve soft bowel movements daily.  No treatments per rectum.  Please seek immediate evaluation at the urgent care if symptoms are severe, you have recurrent or any more then a small amount of blood on TP. Follow up for a virtual visit if any worsening or new concerns prior to your next visit.  Follow up and new patient visit in a 2 weeks. Call our office for this appointment if you have not heard from our office in the next 2-3 business days.

## 2019-04-28 ENCOUNTER — Ambulatory Visit (INDEPENDENT_AMBULATORY_CARE_PROVIDER_SITE_OTHER): Payer: BC Managed Care – PPO | Admitting: Family Medicine

## 2019-04-28 ENCOUNTER — Other Ambulatory Visit: Payer: Self-pay

## 2019-04-28 ENCOUNTER — Encounter: Payer: Self-pay | Admitting: Family Medicine

## 2019-04-28 DIAGNOSIS — J302 Other seasonal allergic rhinitis: Secondary | ICD-10-CM | POA: Diagnosis not present

## 2019-04-28 DIAGNOSIS — I38 Endocarditis, valve unspecified: Secondary | ICD-10-CM | POA: Diagnosis not present

## 2019-04-28 DIAGNOSIS — F419 Anxiety disorder, unspecified: Secondary | ICD-10-CM

## 2019-04-28 DIAGNOSIS — J45990 Exercise induced bronchospasm: Secondary | ICD-10-CM

## 2019-04-28 MED ORDER — ALBUTEROL SULFATE HFA 108 (90 BASE) MCG/ACT IN AERS
2.0000 | INHALATION_SPRAY | Freq: Four times a day (QID) | RESPIRATORY_TRACT | 4 refills | Status: DC | PRN
Start: 2019-04-28 — End: 2021-05-15

## 2019-04-28 NOTE — Progress Notes (Signed)
Virtual Visit via Video Note  I connected with Donna Montgomery on 04/28/19 at  2:00 PM EDT by a video enabled telemedicine application 2/2 COVID-19 pandemic and verified that I am speaking with the correct person using two identifiers.  Location patient: home Location provider:work or home office Persons participating in the virtual visit: patient, provider  I discussed the limitations of evaluation and management by telemedicine and the availability of in person appointments. The patient expressed understanding and agreed to proceed.   HPI: Pt is a 23 yo female with pmh sig for exercise induced asthma, h/o peptic ulcer, seasonal allergies, h/o cartilage tear R knee.  Pt previously seen by Dr. Tawanna Coolerodd.  Pt plays professional basketball.  Was playing in [place] prior to COVID 19 pandemic.  During a physical in [place], was told has a "valve problem"".  Work up included ECHO and stress test.  Pt does not have a copy of the records.  Pt no longer with that team.  Just signed a contract, leaves on August 25 to play ball in [place], didn't realize she had to leave this soon.  Pt notes occasional dizziness. Collapsed when played basketball her first yr.  Family hx: younger sister died 2/2 "hole in heart".    Asthma -was on singulair -not currently taking after receiving a recall letter -using albuterol inhaler prn  Seasonal  Allergies: -zyrtec prn  Anxiety: -in therapy x 1-2 months.  [facility] -more when works out or plays basketball.  Will start sweating, can't breath, can't shoot the ball if someone walks into the gym. -affecting pt's sleep. -started in college  H/o peptic ulcer: -per pt had a "bleeding ulcer" -was hospitalized -2/2 H. Pylori  Hand surgery L hand  Family medical hx: Dad- HTN Sis- depression and anxiety Sis- desc.  Heat issue  "hole in heart"  ROS: See pertinent positives and negatives per HPI.  Past Medical History:  Diagnosis  Date  . Allergy   . Asthma     Past Surgical History:  Procedure Laterality Date  . OPEN REDUCTION INTERNAL FIXATION (ORIF) HAND Left 2017    Family History  Problem Relation Age of Onset  . Asthma Other      Current Outpatient Medications:  .  adapalene (DIFFERIN) 0.1 % cream, Apply topically at bedtime., Disp: , Rfl:  .  albuterol (PROVENTIL HFA;VENTOLIN HFA) 108 (90 Base) MCG/ACT inhaler, Inhale 1 puff into the lungs every 6 (six) hours as needed. For wheezing/ and exercise induced asthma, Disp: 1 Inhaler, Rfl: 2  EXAM:  VITALS per patient if applicable:  GENERAL: alert, oriented, appears well and in no acute distress  HEENT: atraumatic, conjunctiva clear, no obvious abnormalities on inspection of external nose and ears  NECK: normal movements of the head and neck  LUNGS: on inspection no signs of respiratory distress, breathing rate appears normal, no obvious gross SOB, gasping or wheezing  CV: no obvious cyanosis  MS: moves all visible extremities without noticeable abnormality  PSYCH/NEURO: pleasant and cooperative, no obvious depression or anxiety, speech and thought processing grossly intact  ASSESSMENT AND PLAN:  Discussed the following assessment and plan:  Exercise-induced asthma  - Plan: albuterol (VENTOLIN HFA) 108 (90 Base) MCG/ACT inhaler  Heart valve problem  -unable to obtain records from [place]. -will place urgent referral as pt leaves the country Aug 25th. - Plan: Ambulatory referral to Cardiology  Seasonal allergies  -zyrtec prn  Anxiety -pt to f/u next wk in office to discuss  further -encouraged to continue counseling    I discussed the assessment and treatment plan with the patient. The patient was provided an opportunity to ask questions and all were answered. The patient agreed with the plan and demonstrated an understanding of the instructions.   The patient was advised to call back or seek an in-person evaluation if the symptoms  worsen or if the condition fails to improve as anticipated.  I provided 30 minutes of non-face-to-face time during this encounter.   Billie Ruddy, MD

## 2019-05-03 ENCOUNTER — Ambulatory Visit: Payer: BC Managed Care – PPO | Admitting: Family Medicine

## 2019-05-03 ENCOUNTER — Other Ambulatory Visit: Payer: Self-pay

## 2019-05-03 ENCOUNTER — Ambulatory Visit (INDEPENDENT_AMBULATORY_CARE_PROVIDER_SITE_OTHER): Payer: BC Managed Care – PPO | Admitting: Family Medicine

## 2019-05-03 ENCOUNTER — Encounter: Payer: Self-pay | Admitting: Family Medicine

## 2019-05-03 VITALS — BP 110/78 | HR 82 | Temp 98.1°F | Wt 170.0 lb

## 2019-05-03 DIAGNOSIS — I38 Endocarditis, valve unspecified: Secondary | ICD-10-CM

## 2019-05-03 DIAGNOSIS — F329 Major depressive disorder, single episode, unspecified: Secondary | ICD-10-CM | POA: Diagnosis not present

## 2019-05-03 DIAGNOSIS — F419 Anxiety disorder, unspecified: Secondary | ICD-10-CM | POA: Diagnosis not present

## 2019-05-03 MED ORDER — PAROXETINE HCL 20 MG PO TABS: 20.0000 mg | ORAL_TABLET | Freq: Every day | ORAL | 1 refills | Status: DC

## 2019-05-03 NOTE — Patient Instructions (Signed)
Living With Anxiety  After being diagnosed with an anxiety disorder, you may be relieved to know why you have felt or behaved a certain way. It is natural to also feel overwhelmed about the treatment ahead and what it will mean for your life. With care and support, you can manage this condition and recover from it. How to cope with anxiety Dealing with stress Stress is your body's reaction to life changes and events, both good and bad. Stress can last just a few hours or it can be ongoing. Stress can play a major role in anxiety, so it is important to learn both how to cope with stress and how to think about it differently. Talk with your health care provider or a counselor to learn more about stress reduction. He or she may suggest some stress reduction techniques, such as:  Music therapy. This can include creating or listening to music that you enjoy and that inspires you.  Mindfulness-based meditation. This involves being aware of your normal breaths, rather than trying to control your breathing. It can be done while sitting or walking.  Centering prayer. This is a kind of meditation that involves focusing on a word, phrase, or sacred image that is meaningful to you and that brings you peace.  Deep breathing. To do this, expand your stomach and inhale slowly through your nose. Hold your breath for 3-5 seconds. Then exhale slowly, allowing your stomach muscles to relax.  Self-talk. This is a skill where you identify thought patterns that lead to anxiety reactions and correct those thoughts.  Muscle relaxation. This involves tensing muscles then relaxing them. Choose a stress reduction technique that fits your lifestyle and personality. Stress reduction techniques take time and practice. Set aside 5-15 minutes a day to do them. Therapists can offer training in these techniques. The training may be covered by some insurance plans. Other things you can do to manage stress include:  Keeping a  stress diary. This can help you learn what triggers your stress and ways to control your response.  Thinking about how you respond to certain situations. You may not be able to control everything, but you can control your reaction.  Making time for activities that help you relax, and not feeling guilty about spending your time in this way. Therapy combined with coping and stress-reduction skills provides the best chance for successful treatment. Medicines Medicines can help ease symptoms. Medicines for anxiety include:  Anti-anxiety drugs.  Antidepressants.  Beta-blockers. Medicines may be used as the main treatment for anxiety disorder, along with therapy, or if other treatments are not working. Medicines should be prescribed by a health care provider. Relationships Relationships can play a big part in helping you recover. Try to spend more time connecting with trusted friends and family members. Consider going to couples counseling, taking family education classes, or going to family therapy. Therapy can help you and others better understand the condition. How to recognize changes in your condition Everyone has a different response to treatment for anxiety. Recovery from anxiety happens when symptoms decrease and stop interfering with your daily activities at home or work. This may mean that you will start to:  Have better concentration and focus.  Sleep better.  Be less irritable.  Have more energy.  Have improved memory. It is important to recognize when your condition is getting worse. Contact your health care provider if your symptoms interfere with home or work and you do not feel like your condition is improving. Where   to find help and support: You can get help and support from these sources:  Self-help groups.  Online and community organizations.  A trusted spiritual leader.  Couples counseling.  Family education classes.  Family therapy. Follow these instructions  at home:  Eat a healthy diet that includes plenty of vegetables, fruits, whole grains, low-fat dairy products, and lean protein. Do not eat a lot of foods that are high in solid fats, added sugars, or salt.  Exercise. Most adults should do the following: ? Exercise for at least 150 minutes each week. The exercise should increase your heart rate and make you sweat (moderate-intensity exercise). ? Strengthening exercises at least twice a week.  Cut down on caffeine, tobacco, alcohol, and other potentially harmful substances.  Get the right amount and quality of sleep. Most adults need 7-9 hours of sleep each night.  Make choices that simplify your life.  Take over-the-counter and prescription medicines only as told by your health care provider.  Avoid caffeine, alcohol, and certain over-the-counter cold medicines. These may make you feel worse. Ask your pharmacist which medicines to avoid.  Keep all follow-up visits as told by your health care provider. This is important. Questions to ask your health care provider  Would I benefit from therapy?  How often should I follow up with a health care provider?  How long do I need to take medicine?  Are there any long-term side effects of my medicine?  Are there any alternatives to taking medicine? Contact a health care provider if:  You have a hard time staying focused or finishing daily tasks.  You spend many hours a day feeling worried about everyday life.  You become exhausted by worry.  You start to have headaches, feel tense, or have nausea.  You urinate more than normal.  You have diarrhea. Get help right away if:  You have a racing heart and shortness of breath.  You have thoughts of hurting yourself or others. If you ever feel like you may hurt yourself or others, or have thoughts about taking your own life, get help right away. You can go to your nearest emergency department or call:  Your local emergency services  (911 in the [place]  A suicide crisis helpline, such as the National Suicide Prevention Lifeline at 1-800-273-8255. This is open 24-hours a day. Summary  Taking steps to deal with stress can help calm you.  Medicines cannot cure anxiety disorders, but they can help ease symptoms.  Family, friends, and partners can play a big part in helping you recover from an anxiety disorder. This information is not intended to replace advice given to you by your health care provider. Make sure you discuss any questions you have with your health care provider. Document Released: 08/25/2016 Document Revised: 08/13/2017 Document Reviewed: 08/25/2016 Elsevier Patient Education  2020 [employer]  Living With Depression Everyone experiences occasional disappointment, sadness, and loss in their lives. When you are feeling down, blue, or sad for at least 2 weeks in a row, it may mean that you have depression. Depression can affect your thoughts and feelings, relationships, daily activities, and physical health. It is caused by changes in the way your brain functions. If you receive a diagnosis of depression, your health care provider will tell you which type of depression you have and what treatment options are available to you. If you are living with depression, there are ways to help you recover from it and also ways to prevent it from coming   back. How to cope with lifestyle changes Coping with stress     Stress is your body's reaction to life changes and events, both good and bad. Stressful situations may include:  Getting married.  The death of a spouse.  Losing a job.  Retiring.  Having a baby. Stress can last just a few hours or it can be ongoing. Stress can play a major role in depression, so it is important to learn both how to cope with stress and how to think about it differently. Talk with your health care provider or a counselor if you would like to learn more about stress reduction. He or  she may suggest some stress reduction techniques, such as:  Music therapy. This can include creating music or listening to music. Choose music that you enjoy and that inspires you.  Mindfulness-based meditation. This kind of meditation can be done while sitting or walking. It involves being aware of your normal breaths, rather than trying to control your breathing.  Centering prayer. This is a kind of meditation that involves focusing on a spiritual word or phrase. Choose a word, phrase, or sacred image that is meaningful to you and that brings you peace.  Deep breathing. To do this, expand your stomach and inhale slowly through your nose. Hold your breath for 3-5 seconds, then exhale slowly, allowing your stomach muscles to relax.  Muscle relaxation. This involves intentionally tensing muscles then relaxing them. Choose a stress reduction technique that fits your lifestyle and personality. Stress reduction techniques take time and practice to develop. Set aside 5-15 minutes a day to do them. Therapists can offer training in these techniques. The training may be covered by some insurance plans. Other things you can do to manage stress include:  Keeping a stress diary. This can help you learn what triggers your stress and ways to control your response.  Understanding what your limits are and saying no to requests or events that lead to a schedule that is too full.  Thinking about how you respond to certain situations. You may not be able to control everything, but you can control how you react.  Adding humor to your life by watching funny films or TV shows.  Making time for activities that help you relax and not feeling guilty about spending your time this way.  Medicines Your health care provider may suggest certain medicines if he or she feels that they will help improve your condition. Avoid using alcohol and other substances that may prevent your medicines from working properly (may  interact). It is also important to:  Talk with your pharmacist or health care provider about all the medicines that you take, their possible side effects, and what medicines are safe to take together.  Make it your goal to take part in all treatment decisions (shared decision-making). This includes giving input on the side effects of medicines. It is best if shared decision-making with your health care provider is part of your total treatment plan. If your health care provider prescribes a medicine, you may not notice the full benefits of it for 4-8 weeks. Most people who are treated for depression need to be on medicine for at least 6-12 months after they feel better. If you are taking medicines as part of your treatment, do not stop taking medicines without first talking to your health care provider. You may need to have the medicine slowly decreased (tapered) over time to decrease the risk of harmful side effects. Relationships Your health  care provider may suggest family therapy along with individual therapy and drug therapy. While there may not be family problems that are causing you to feel depressed, it is still important to make sure your family learns as much as they can about your mental health. Having your family's support can help make your treatment successful. How to recognize changes in your condition Everyone has a different response to treatment for depression. Recovery from major depression happens when you have not had signs of major depression for two months. This may mean that you will start to:  Have more interest in doing activities.  Feel less hopeless than you did 2 months ago.  Have more energy.  Overeat less often, or have better or improving appetite.  Have better concentration. Your health care provider will work with you to decide the next steps in your recovery. It is also important to recognize when your condition is getting worse. Watch for these signs:  Having  fatigue or low energy.  Eating too much or too little.  Sleeping too much or too little.  Feeling restless, agitated, or hopeless.  Having trouble concentrating or making decisions.  Having unexplained physical complaints.  Feeling irritable, angry, or aggressive. Get help as soon as you or your family members notice these symptoms coming back. How to get support and help from others How to talk with friends and family members about your condition  Talking to friends and family members about your condition can provide you with one way to get support and guidance. Reach out to trusted friends or family members, explain your symptoms to them, and let them know that you are working with a health care provider to treat your depression. Financial resources Not all insurance plans cover mental health care, so it is important to check with your insurance carrier. If paying for co-pays or counseling services is a problem, search for a local or county mental health care center. They may be able to offer public mental health care services at low or no cost when you are not able to see a private health care provider. If you are taking medicine for depression, you may be able to get the generic form, which may be less expensive. Some makers of prescription medicines also offer help to patients who cannot afford the medicines they need. Follow these instructions at home:   Get the right amount and quality of sleep.  Cut down on using caffeine, tobacco, alcohol, and other potentially harmful substances.  Try to exercise, such as walking or lifting small weights.  Take over-the-counter and prescription medicines only as told by your health care provider.  Eat a healthy diet that includes plenty of vegetables, fruits, whole grains, low-fat dairy products, and lean protein. Do not eat a lot of foods that are high in solid fats, added sugars, or salt.  Keep all follow-up visits as told by your health  care provider. This is important. Contact a health care provider if:  You stop taking your antidepressant medicines, and you have any of these symptoms: ? Nausea. ? Headache. ? Feeling lightheaded. ? Chills and body aches. ? Not being able to sleep (insomnia).  You or your friends and family think your depression is getting worse. Get help right away if:  You have thoughts of hurting yourself or others. If you ever feel like you may hurt yourself or others, or have thoughts about taking your own life, get help right away. You can go to your nearest   emergency department or call:  Your local emergency services (911 in the [place]  A suicide crisis helpline, such as the National Suicide Prevention Lifeline at 458-282-22271-226-537-1517. This is open 24-hours a day. Summary  If you are living with depression, there are ways to help you recover from it and also ways to prevent it from coming back.  Work with your health care team to create a management plan that includes counseling, stress management techniques, and healthy lifestyle habits. This information is not intended to replace advice given to you by your health care provider. Make sure you discuss any questions you have with your health care provider. Document Released: 08/03/2016 Document Revised: 12/23/2018 Document Reviewed: 08/03/2016 Elsevier Patient Education  2020 [employer] Paroxetine tablets What is this medicine? PAROXETINE (pa ROX e teen) is used to treat depression. It may also be used to treat anxiety disorders, obsessive compulsive disorder, panic attacks, post traumatic stress, and premenstrual dysphoric disorder (PMDD). This medicine may be used for other purposes; ask your health care provider or pharmacist if you have questions. COMMON BRAND NAME(S): Paxil, Pexeva What should I tell my health care provider before I take this medicine? They need to know if you have any of these conditions:  bipolar disorder or a family  history of bipolar disorder  bleeding disorders  glaucoma  heart disease  kidney disease  liver disease  low levels of sodium in the blood  seizures  suicidal thoughts, plans, or attempt; a previous suicide attempt by you or a family member  take MAOIs like Carbex, Eldepryl, Marplan, Nardil, and Parnate  take medicines that treat or prevent blood clots  thyroid disease  an unusual or allergic reaction to paroxetine, other medicines, foods, dyes, or preservatives  pregnant or trying to get pregnant  breast-feeding How should I use this medicine? Take this medicine by mouth with a glass of water. Follow the directions on the prescription label. You can take it with or without food. Take your medicine at regular intervals. Do not take your medicine more often than directed. Do not stop taking this medicine suddenly except upon the advice of your doctor. Stopping this medicine too quickly may cause serious side effects or your condition may worsen. A special MedGuide will be given to you by the pharmacist with each prescription and refill. Be sure to read this information carefully each time. Talk to your pediatrician regarding the use of this medicine in children. Special care may be needed. Overdosage: If you think you have taken too much of this medicine contact a poison control center or emergency room at once. NOTE: This medicine is only for you. Do not share this medicine with others. What if I miss a dose? If you miss a dose, take it as soon as you can. If it is almost time for your next dose, take only that dose. Do not take double or extra doses. What may interact with this medicine? Do not take this medicine with any of the following medications:  linezolid  MAOIs like Carbex, Eldepryl, Marplan, Nardil, and Parnate  methylene blue (injected into a vein)  pimozide  thioridazine This medicine may also interact with the following medications:  alcohol   amphetamines  aspirin and aspirin-like medicines  atomoxetine  certain medicines for depression, anxiety, or psychotic disturbances  certain medicines for irregular heart beat like propafenone, flecainide, encainide, and quinidine  certain medicines for migraine headache like almotriptan, eletriptan, frovatriptan, naratriptan, rizatriptan, sumatriptan, zolmitriptan  cimetidine  digoxin  diuretics  fentanyl  fosamprenavir  furazolidone  isoniazid  lithium  medicines that treat or prevent blood clots like warfarin, enoxaparin, and dalteparin  medicines for sleep  NSAIDs, medicines for pain and inflammation, like ibuprofen or naproxen  phenobarbital  phenytoin  procarbazine  rasagiline  ritonavir  supplements like St. John's wort, kava kava, valerian  tamoxifen  tramadol  tryptophan This list may not describe all possible interactions. Give your health care provider a list of all the medicines, herbs, non-prescription drugs, or dietary supplements you use. Also tell them if you smoke, drink alcohol, or use illegal drugs. Some items may interact with your medicine. What should I watch for while using this medicine? Tell your doctor if your symptoms do not get better or if they get worse. Visit your doctor or health care professional for regular checks on your progress. Because it may take several weeks to see the full effects of this medicine, it is important to continue your treatment as prescribed by your doctor. Patients and their families should watch out for new or worsening thoughts of suicide or depression. Also watch out for sudden changes in feelings such as feeling anxious, agitated, panicky, irritable, hostile, aggressive, impulsive, severely restless, overly excited and hyperactive, or not being able to sleep. If this happens, especially at the beginning of treatment or after a change in dose, call your health care professional. Dennis Bast may get drowsy or  dizzy. Do not drive, use machinery, or do anything that needs mental alertness until you know how this medicine affects you. Do not stand or sit up quickly, especially if you are an older patient. This reduces the risk of dizzy or fainting spells. Alcohol may interfere with the effect of this medicine. Avoid alcoholic drinks. Your mouth may get dry. Chewing sugarless gum or sucking hard candy, and drinking plenty of water will help. Contact your doctor if the problem does not go away or is severe. What side effects may I notice from receiving this medicine? Side effects that you should report to your doctor or health care professional as soon as possible:  allergic reactions like skin rash, itching or hives, swelling of the face, lips, or tongue  anxious  black, tarry stools  changes in vision  confusion  elevated mood, decreased need for sleep, racing thoughts, impulsive behavior  eye pain  fast, irregular heartbeat  feeling faint or lightheaded, falls  feeling agitated, angry, or irritable  hallucination, loss of contact with reality  loss of balance or coordination  loss of memory  painful or prolonged erections  restlessness, pacing, inability to keep still  seizures  stiff muscles  suicidal thoughts or other mood changes  trouble sleeping  unusual bleeding or bruising  unusually weak or tired  vomiting Side effects that usually do not require medical attention (report to your doctor or health care professional if they continue or are bothersome):  change in appetite or weight  change in sex drive or performance  diarrhea  dizziness  dry mouth  increased sweating  indigestion, nausea  tired  tremors This list may not describe all possible side effects. Call your doctor for medical advice about side effects. You may report side effects to FDA at 1-800-FDA-1088. Where should I keep my medicine? Keep out of the reach of children. Store at room  temperature between 15 and 30 degrees C (59 and 86 degrees F). Keep container tightly closed. Throw away any unused medicine after the expiration date. NOTE: This sheet is  a summary. It may not cover all possible information. If you have questions about this medicine, talk to your doctor, pharmacist, or health care provider.  2020 Elsevier/Gold Standard (2016-02-01 15:50:32)

## 2019-05-03 NOTE — Progress Notes (Signed)
Subjective:    Patient ID: Donna Montgomery, female    DOB: September 16, 1995, 23 y.o.   MRN: [id number]  No chief complaint on file.   HPI Patient was seen today for f/u on anxiety.  Pt notes anxiety x yrs when playing basketball.  Pt also notes depression, worse in the winter.  Pt gets anxious if someone walks in while she is shooting in the gym.  Pt thinks this may have started years ago after she tore ligaments in her ankle and started playing again.  Her coach at the time was less than understanding.  Pt is in counseling.  Pt is interested in starting medication.  Pt denies SI/HI.  Pt states she has a Cardiology appt scheduled for 8/25.  Pt leaves the country 8/26.   Past Medical History:  Diagnosis Date  . Allergy   . Asthma     Allergies  Allergen Reactions  . Pollen Extract Cough and Other (See Comments)    ROS General: Denies fever, chills, night sweats, changes in weight, changes in appetite HEENT: Denies headaches, ear pain, changes in vision, rhinorrhea, sore throat CV: Denies CP, palpitations, SOB, orthopnea Pulm: Denies SOB, cough, wheezing GI: Denies abdominal pain, nausea, vomiting, diarrhea, constipation GU: Denies dysuria, hematuria, frequency, vaginal discharge Msk: Denies muscle cramps, joint pains Neuro: Denies weakness, numbness, tingling Skin: Denies rashes, bruising Psych: Denies hallucinations  +anxiety and depression    Objective:    Blood pressure 110/78, pulse 82, temperature 98.1 F (36.7 C), temperature source Temporal, weight 170 lb (77.1 kg), SpO2 98 %.   Gen. Pleasant, well-nourished, in no distress, normal affect   HEENT: [place]/AT, face symmetric, no scleral icterus, PERRLA, EOMI, nares patent without drainage Neck: No JVD, no thyromegaly, no carotid bruits Lungs: no accessory muscle use, CTAB, no wheezes or rales Cardiovascular: RRR, no m/r/g, no peripheral edema Abdomen: BS present, soft, NT/ND Musculoskeletal: No deformities, no cyanosis or  clubbing, normal tone Neuro:  A&Ox3, CN II-XII intact, normal gait Skin:  Warm, no lesions/ rash   Wt Readings from Last 3 Encounters:  05/03/19 170 lb (77.1 kg)  02/14/19 175 lb (79.4 kg)  04/29/18 169 lb 1.6 oz (76.7 kg)    No results found for: WBC, HGB, HCT, PLT, GLUCOSE, CHOL, TRIG, HDL, LDLDIRECT, LDLCALC, ALT, AST, NA, K, CL, CREATININE, BUN, CO2, TSH, PSA, INR, GLUF, HGBA1C, MICROALBUR  Assessment/Plan:  Anxiety and depression  -PHQ 9 score 18 -GAD 7 score 20  -Patient encouraged to continue counseling -Discussed various treatment options.  Will start medication -Patient to consider at home light therapy for SAD. -Given precautions - Plan: PARoxetine (PAXIL) 20 MG tablet, TSH, T4, free, CBC with Differential/Platelet  Heart valve problem -No murmur appreciated -Patient encouraged to contact cardiology office to see if she can be placed on cancellation list - Plan: CBC with Differential/Platelet, Comprehensive metabolic panel  F/u in 4-6 wks  Grier Mitts, MD

## 2019-05-06 ENCOUNTER — Encounter

## 2019-05-07 NOTE — Progress Notes (Signed)
Cardiology Office Note   Date:  05/08/2019   ID:  Donna Montgomery, DOB 29-Sep-1995, MRN [id number]  PCP:  Donna Ruddy, MD  Cardiologist:   Donna Moffatt [place], MD   Chief Complaint  Patient presents with  . Heart Murmur      History of Present Illness: Donna Montgomery is a 23 y.o. female who is seen at the request of Dr Donna Montgomery for evaluation of a "heart valve" problem. 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  Was playing in [place] prior to [place] 19 pandemic and during a physical was told has a murmur. Was asymptomatic at the time.   Work up included ECHO and stress test.  Pt does not have a copy of the records. She states she was told she had a valve that was leaking. Understanding was limited due to language barrier.  She is no longer with that team.  Just signed a contract,and is scheduled to leave  tomorrow to play ball in [place]. She had a  sister died in infancy due to a  "hole in heart".   She is physically in great condition. Denies any limitation currently in her basketball playing. 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 Reports one episode while playing ball in [place] 2 years ago where she felt out of breath and called for a sub. She then passed out. She has no further episodes of this. In high school and early in her college days she would feel a "cramp" in her chest and felt like she couldn't move or breathe for about 3 minutes. This was not associated with activity and she hasn't had recurrence of this for at least 2 years.     Past Medical History:  Diagnosis Date  . Allergy   . Asthma     Past Surgical History:  Procedure Laterality Date  . OPEN REDUCTION INTERNAL FIXATION (ORIF) HAND Left 2017     Current Outpatient Medications  Medication Sig Dispense Refill  . adapalene (DIFFERIN) 0.1 % cream Apply topically at bedtime.    Marland Kitchen albuterol (VENTOLIN HFA) 108 (90 Base) MCG/ACT inhaler Inhale 2 puffs into the lungs  every 6 (six) hours as needed for wheezing or shortness of breath. 24 g 4  . PARoxetine (PAXIL) 20 MG tablet Take 1 tablet (20 mg total) by mouth daily. 90 tablet 1   No current facility-administered medications for this visit.     Allergies:   Pollen extract    Social History:  The patient  reports that she has never smoked. She has never used smokeless tobacco. She reports current alcohol use. She reports that she does not use drugs.   Family History:  The patient's family history includes Asthma in an other family member.    ROS:  Please see the history of present illness.   Otherwise, review of systems are positive for none.   All other systems are reviewed and negative.    PHYSICAL EXAM: VS:  BP 131/81   Pulse 86   Temp 97.7 F (36.5 C)   Ht 6\' 5"  (1.956 m)   Wt 170 lb (77.1 kg)   SpO2 100%   BMI 20.16 kg/m  , BMI Body mass index is 20.16 kg/m. GEN: Well nourished, tall, well developed, in no acute distress  HEENT: normal  Neck: no JVD, carotid bruits, or masses Cardiac: RRR; normal S1 and 2. No gallop. There is a soft 2/6 systolic murmur at the  apex and LSB- loudest when patient lying on left side. No click or rub.  Respiratory:  clear to auscultation bilaterally, normal work of breathing GI: soft, nontender, nondistended, + BS MS: no deformity or atrophy  Skin: warm and dry, no rash Neuro:  Strength and sensation are intact Psych: euthymic mood, full affect   EKG:  EKG is ordered today. The ekg ordered today demonstrates NSR with normal Ecg. QTc 419 msec. Rate 75. I have personally reviewed and interpreted this study.    Recent Labs: No results found for requested labs within last 8760 hours.    Lipid Panel No results found for: CHOL, TRIG, HDL, CHOLHDL, VLDL, LDLCALC, LDLDIRECT    Wt Readings from Last 3 Encounters:  05/08/19 170 lb (77.1 kg)  05/03/19 170 lb (77.1 kg)  02/14/19 175 lb (79.4 kg)      Other studies Reviewed: Additional studies/  records that were reviewed today include: none   ASSESSMENT AND PLAN:  1.  Heart murmur. 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 She appears to be on the mildly end of the spectrum. It sounds like she was evaluated with Echo in [place] and was told she could continue to play ball. Normal ETT. I agree that there is no limitation to her ability to play competitive sports. I would like to repeat an Echo here when she returns home for her next break to follow up on her murmur and decide long term follow up. I asked her to call me when she is back in the states to arrange.    Current medicines are reviewed at length with the patient today.  The patient does not have concerns regarding medicines.  The following changes have been made:  no change  Labs/ tests ordered today include: No orders of the defined types were placed in this encounter.    Disposition:   FU next year with Echo  Signed, Donna Reta [place], MD  05/08/2019 5:38 PM    [facility] [address], [place], [place], [id number] Phone [phone], Fax [phone]

## 2019-05-08 ENCOUNTER — Ambulatory Visit: Payer: BC Managed Care – PPO | Admitting: Cardiology

## 2019-05-08 ENCOUNTER — Other Ambulatory Visit: Payer: Self-pay

## 2019-05-08 ENCOUNTER — Encounter: Payer: Self-pay | Admitting: Cardiology

## 2019-05-08 VITALS — BP 131/81 | HR 86 | Temp 97.7°F | Ht 77.0 in | Wt 170.0 lb

## 2019-05-08 DIAGNOSIS — R011 Cardiac murmur, unspecified: Secondary | ICD-10-CM

## 2019-05-08 HISTORY — DX: Cardiac murmur, unspecified: R01.1

## 2019-06-09 ENCOUNTER — Other Ambulatory Visit: Payer: Self-pay

## 2019-06-09 ENCOUNTER — Ambulatory Visit (INDEPENDENT_AMBULATORY_CARE_PROVIDER_SITE_OTHER): Payer: BC Managed Care – PPO | Admitting: Family Medicine

## 2019-06-09 DIAGNOSIS — S83207D Unspecified tear of unspecified meniscus, current injury, left knee, subsequent encounter: Secondary | ICD-10-CM

## 2019-06-09 DIAGNOSIS — F419 Anxiety disorder, unspecified: Secondary | ICD-10-CM | POA: Diagnosis not present

## 2019-06-09 DIAGNOSIS — Z20822 Contact with and (suspected) exposure to covid-19: Secondary | ICD-10-CM

## 2019-06-09 DIAGNOSIS — F329 Major depressive disorder, single episode, unspecified: Secondary | ICD-10-CM | POA: Diagnosis not present

## 2019-06-09 DIAGNOSIS — Z20828 Contact with and (suspected) exposure to other viral communicable diseases: Secondary | ICD-10-CM

## 2019-06-09 DIAGNOSIS — F32A Depression, unspecified: Secondary | ICD-10-CM

## 2019-06-09 NOTE — Progress Notes (Signed)
Virtual Visit via Video Note  I connected with Donna Montgomery on 06/09/19 at  8:00 AM EDT by a video enabled telemedicine application 2/2 ZOXWR-60 pandemic and verified that I am speaking with the correct person using two identifiers.  Location patient: new apt Location provider:work or home office Persons participating in the virtual visit: patient, provider  I discussed the limitations of evaluation and management by telemedicine and the availability of in person appointments. The patient expressed understanding and agreed to proceed.   HPI: Pt is feeling better now that she is on Paxil 20 mg.  She notices that she is not as stressed.  Pt is able to walk to practice and the stores which has been good for her.  Pt tore her meniscus during the 2nd practice and 2 of her teammates tested positive for COVID-19.  Pt is s/p drainage of L knee effusion, plasma therapy, and PT.   Pt is following up with a team provider weekly.  This wk she was cleared to shoot baskets, but no jumping.  Next week she is hoping to get cleared.to use the exercise bike.  Pt states she is not feeling sick but the 2 teammates were symptomatic at practice.  Pt to get a COVID test soon, but her team is saying she has to pay for it.   ROS: See pertinent positives and negatives per HPI.  Past Medical History:  Diagnosis Date  . Allergy   . Asthma     Past Surgical History:  Procedure Laterality Date  . OPEN REDUCTION INTERNAL FIXATION (ORIF) HAND Left 2017    Family History  Problem Relation Age of Onset  . Asthma Other     SOCIAL HX: Pt is a Education officer, environmental.   Current Outpatient Medications:  .  adapalene (DIFFERIN) 0.1 % cream, Apply topically at bedtime., Disp: , Rfl:  .  albuterol (VENTOLIN HFA) 108 (90 Base) MCG/ACT inhaler, Inhale 2 puffs into the lungs every 6 (six) hours as needed for wheezing or shortness of breath., Disp: 24 g, Rfl: 4 .  PARoxetine (PAXIL) 20 MG tablet, Take 1 tablet (20  mg total) by mouth daily., Disp: 90 tablet, Rfl: 1  EXAM:  VITALS per patient if applicable:  GENERAL: alert, oriented, appears well and in no acute distress  HEENT: atraumatic, conjunctiva clear, no obvious abnormalities on inspection of external nose and ears  NECK: normal movements of the head and neck  LUNGS: on inspection no signs of respiratory distress, breathing rate appears normal, no obvious gross SOB, gasping or wheezing  CV: no obvious cyanosis  MS: Knee immobilizer on L leg.  moves all visible extremities without noticeable abnormality  PSYCH/NEURO: pleasant and cooperative, no obvious depression or anxiety, speech and thought processing grossly intact  ASSESSMENT AND PLAN:  Discussed the following assessment and plan:  Anxiety and depression -improving -continue Paxil 20 mg daily -continue walking and other lifestyle modifications.  Consider light therapy for winter months. -f/u in 1-2 months  Tear of meniscus of left knee as current injury, unspecified meniscus, unspecified tear type, subsequent encounter -continue knee immobilizer -continue f/u with Ortho and PT  Exposure to COVID 19 -asymptomatic -discussed having testing done -discussed self quarantine.  If becomes symptomatic, supportive care. -given precautions    I discussed the assessment and treatment plan with the patient. The patient was provided an opportunity to ask questions and all were answered. The patient agreed with the plan and demonstrated an understanding of the instructions.  The patient was advised to call back or seek an in-person evaluation if the symptoms worsen or if the condition fails to improve as anticipated.   Billie Ruddy, MD

## 2019-09-07 ENCOUNTER — Ambulatory Visit: Payer: BC Managed Care – PPO | Attending: Internal Medicine

## 2019-09-07 DIAGNOSIS — Z20822 Contact with and (suspected) exposure to covid-19: Secondary | ICD-10-CM

## 2019-09-08 LAB — NOVEL CORONAVIRUS, NAA: SARS-CoV-2, NAA: NOT DETECTED

## 2019-10-13 ENCOUNTER — Other Ambulatory Visit: Payer: Self-pay

## 2019-10-13 ENCOUNTER — Encounter: Payer: Self-pay | Admitting: Family Medicine

## 2019-10-13 ENCOUNTER — Ambulatory Visit (INDEPENDENT_AMBULATORY_CARE_PROVIDER_SITE_OTHER): Payer: BC Managed Care – PPO | Admitting: Family Medicine

## 2019-10-13 VITALS — BP 116/70 | HR 69 | Temp 97.7°F | Ht 77.0 in | Wt 174.1 lb

## 2019-10-13 DIAGNOSIS — E611 Iron deficiency: Secondary | ICD-10-CM | POA: Diagnosis not present

## 2019-10-13 DIAGNOSIS — J45990 Exercise induced bronchospasm: Secondary | ICD-10-CM | POA: Diagnosis not present

## 2019-10-13 DIAGNOSIS — F5102 Adjustment insomnia: Secondary | ICD-10-CM | POA: Diagnosis not present

## 2019-10-13 LAB — CBC WITH DIFFERENTIAL/PLATELET
Basophils Absolute: 0.1 10*3/uL (ref 0.0–0.1)
Basophils Relative: 0.8 % (ref 0.0–3.0)
Eosinophils Absolute: 0.4 10*3/uL (ref 0.0–0.7)
Eosinophils Relative: 6.1 % — ABNORMAL HIGH (ref 0.0–5.0)
HCT: 32.7 % — ABNORMAL LOW (ref 36.0–46.0)
Hemoglobin: 10.5 g/dL — ABNORMAL LOW (ref 12.0–15.0)
Lymphocytes Relative: 41.8 % (ref 12.0–46.0)
Lymphs Abs: 2.8 10*3/uL (ref 0.7–4.0)
MCHC: 32.2 g/dL (ref 30.0–36.0)
MCV: 80.8 fl (ref 78.0–100.0)
Monocytes Absolute: 0.7 10*3/uL (ref 0.1–1.0)
Monocytes Relative: 9.7 % (ref 3.0–12.0)
Neutro Abs: 2.8 10*3/uL (ref 1.4–7.7)
Neutrophils Relative %: 41.6 % — ABNORMAL LOW (ref 43.0–77.0)
Platelets: 353 10*3/uL (ref 150.0–400.0)
RBC: 4.05 Mil/uL (ref 3.87–5.11)
RDW: 16.4 % — ABNORMAL HIGH (ref 11.5–15.5)
WBC: 6.8 10*3/uL (ref 4.0–10.5)

## 2019-10-13 LAB — IBC PANEL
Iron: 43 ug/dL (ref 42–145)
Saturation Ratios: 12.1 % — ABNORMAL LOW (ref 20.0–50.0)
Transferrin: 253 mg/dL (ref 212.0–360.0)

## 2019-10-13 LAB — FERRITIN: Ferritin: 9 ng/mL — ABNORMAL LOW (ref 10.0–291.0)

## 2019-10-13 MED ORDER — MONTELUKAST SODIUM 10 MG PO TABS: 10.0000 mg | ORAL_TABLET | Freq: Every day | ORAL | 3 refills | Status: DC

## 2019-10-13 NOTE — Addendum Note (Signed)
Addended by: Philemon Kingdom on: 10/13/2019 04:32 PM   Modules accepted: Orders

## 2019-10-13 NOTE — Progress Notes (Signed)
  Subjective:     Patient ID: Donna Montgomery, female   DOB: 08/05/1996, 24 y.o.   MRN: [id number]  HPI Seen today for several issues as follows  She is concerned her iron may be low.  She has history apparently of anemia for several years.  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  She is back here she thinks to stay now.  She states that when she has had physicals in the past (most recently in [place]) she was told that her iron levels were low.  She has regular menses but these are fairly heavy.  She has not been recently consistently taking iron replacement.  She has some fatigue issues in general with activity.  She also relates that she has some bifrontal headaches intermittently.  She thinks this may be due to poor sleep.  She has had difficulty getting asleep and staying asleep.  Minimal caffeine use.  No alcohol use.  Headaches tend to be diffuse and bifrontal and bandlike.  No daily regular nonsteroidal or analgesic use  History of asthma.  She takes Singulair needs refills and requesting that today.  Asthma currently fairly well controlled  Past Medical History:  Diagnosis Date  . Allergy   . Asthma    Past Surgical History:  Procedure Laterality Date  . OPEN REDUCTION INTERNAL FIXATION (ORIF) HAND Left 2017    reports that she has never smoked. She has never used smokeless tobacco. She reports current alcohol use. She reports that she does not use drugs. family history includes Asthma in an other family member. Allergies  Allergen Reactions  . Pollen Extract Cough and Other (See Comments)  . Other     Allergic to cats     Review of Systems  Constitutional: Positive for fatigue.  Eyes: Negative for visual disturbance.  Respiratory: Negative for cough, chest tightness, shortness of breath and wheezing.   Cardiovascular: Negative for chest pain, palpitations and leg swelling.  Neurological: Positive for headaches. Negative for dizziness,  seizures, syncope, weakness and light-headedness.  Psychiatric/Behavioral: Positive for sleep disturbance.       Objective:   Physical Exam Constitutional:      Appearance: She is well-developed.  Eyes:     Pupils: Pupils are equal, round, and reactive to light.  Neck:     Thyroid: No thyromegaly.     Vascular: No JVD.  Cardiovascular:     Rate and Rhythm: Normal rate and regular rhythm.     Heart sounds: No gallop.   Pulmonary:     Effort: Pulmonary effort is normal. No respiratory distress.     Breath sounds: Normal breath sounds. No wheezing or rales.  Musculoskeletal:     Cervical back: Neck supple.  Neurological:     Mental Status: She is alert.        Assessment:     #1 history of reported anemia presumably iron deficient but not confirmed.  Probably related to regular heavy menses  #2 history of asthma currently out of Singulair and requesting refills  #3 insomnia, transient    Plan:     -Suggested trial of melatonin 5 to 10 mg nightly.  Sleep hygiene discussed with handout given.  Avoid regular use of sedative-hypnotics if possible  -Refill Singulair for 1 year  -Check labs including CBC, iron, TIBC, ferritin  Kristian Covey MD [facility]

## 2019-10-16 ENCOUNTER — Encounter: Payer: Self-pay | Admitting: Family Medicine

## 2019-10-16 LAB — ABO AND RH

## 2019-10-16 LAB — EXTRA LAV TOP TUBE

## 2019-10-16 NOTE — Telephone Encounter (Signed)
Spoke with pt reviewed lab results with pt verbalized understanding of her lab results and recommendation.

## 2019-10-16 NOTE — Telephone Encounter (Signed)
Patient returned Donna Montgomery's call for lab results.   Please Advise

## 2019-12-01 ENCOUNTER — Ambulatory Visit: Payer: BC Managed Care – PPO | Attending: Internal Medicine

## 2019-12-01 DIAGNOSIS — Z20822 Contact with and (suspected) exposure to covid-19: Secondary | ICD-10-CM

## 2019-12-02 ENCOUNTER — Other Ambulatory Visit: Payer: Self-pay | Admitting: Family Medicine

## 2019-12-02 DIAGNOSIS — F32A Depression, unspecified: Secondary | ICD-10-CM

## 2019-12-02 DIAGNOSIS — F329 Major depressive disorder, single episode, unspecified: Secondary | ICD-10-CM

## 2019-12-02 LAB — NOVEL CORONAVIRUS, NAA: SARS-CoV-2, NAA: NOT DETECTED

## 2019-12-04 NOTE — Telephone Encounter (Signed)
Pt LOV was on 10/13/2019 with Dr Caryl Never last refill done on 05/03/2019 for 90 tablets with 1 refill, please advise if ok to refill

## 2019-12-25 ENCOUNTER — Encounter: Payer: Self-pay | Admitting: Family Medicine

## 2019-12-25 ENCOUNTER — Other Ambulatory Visit: Payer: Self-pay | Admitting: Family Medicine

## 2019-12-25 ENCOUNTER — Ambulatory Visit (INDEPENDENT_AMBULATORY_CARE_PROVIDER_SITE_OTHER): Payer: BC Managed Care – PPO | Admitting: Family Medicine

## 2019-12-25 ENCOUNTER — Other Ambulatory Visit: Payer: Self-pay

## 2019-12-25 VITALS — BP 98/76 | HR 67 | Temp 97.6°F | Wt 173.0 lb

## 2019-12-25 DIAGNOSIS — D509 Iron deficiency anemia, unspecified: Secondary | ICD-10-CM

## 2019-12-25 DIAGNOSIS — R5383 Other fatigue: Secondary | ICD-10-CM | POA: Diagnosis not present

## 2019-12-25 DIAGNOSIS — G4700 Insomnia, unspecified: Secondary | ICD-10-CM | POA: Diagnosis not present

## 2019-12-25 DIAGNOSIS — F419 Anxiety disorder, unspecified: Secondary | ICD-10-CM

## 2019-12-25 DIAGNOSIS — N920 Excessive and frequent menstruation with regular cycle: Secondary | ICD-10-CM | POA: Diagnosis not present

## 2019-12-25 DIAGNOSIS — E559 Vitamin D deficiency, unspecified: Secondary | ICD-10-CM

## 2019-12-25 DIAGNOSIS — F329 Major depressive disorder, single episode, unspecified: Secondary | ICD-10-CM

## 2019-12-25 LAB — CBC
HCT: 35.5 % — ABNORMAL LOW (ref 36.0–46.0)
Hemoglobin: 11.9 g/dL — ABNORMAL LOW (ref 12.0–15.0)
MCHC: 33.6 g/dL (ref 30.0–36.0)
MCV: 88.7 fl (ref 78.0–100.0)
Platelets: 302 10*3/uL (ref 150.0–400.0)
RBC: 4 Mil/uL (ref 3.87–5.11)
RDW: 16.8 % — ABNORMAL HIGH (ref 11.5–15.5)
WBC: 4.8 10*3/uL (ref 4.0–10.5)

## 2019-12-25 LAB — T4, FREE: Free T4: 0.81 ng/dL (ref 0.60–1.60)

## 2019-12-25 LAB — TSH: TSH: 1.99 u[IU]/mL (ref 0.35–4.50)

## 2019-12-25 LAB — FERRITIN: Ferritin: 20.5 ng/mL (ref 10.0–291.0)

## 2019-12-25 LAB — VITAMIN D 25 HYDROXY (VIT D DEFICIENCY, FRACTURES): VITD: 26.96 ng/mL — ABNORMAL LOW (ref 30.00–100.00)

## 2019-12-25 MED ORDER — VITAMIN D (ERGOCALCIFEROL) 1.25 MG (50000 UNIT) PO CAPS: 50000.0000 [IU] | ORAL_CAPSULE | ORAL | 0 refills | Status: DC

## 2019-12-25 MED ORDER — PAROXETINE HCL 30 MG PO TABS: 30.0000 mg | ORAL_TABLET | Freq: Every day | ORAL | 2 refills | Status: DC

## 2019-12-25 NOTE — Progress Notes (Signed)
Subjective:    Patient ID: Donna Montgomery, female    DOB: [birth year], 24 y.o.   MRN: [id number]  No chief complaint on file.   HPI Patient was seen today for ongoing concern.  Patient endorses history of anemia.  Endorses taking iron twice daily.  LMP started 4/7 with heavy bleeding and clots.  Pt endorses fatigue, lightheadedness, headaches, insomnia, and discoloration of nails.  Pt denies palpitations, dizziness, near syncope, pelvic heaviness.  Not sexually active with men.  Pt states menses typically last 7 days with day 5 being a lighter day.  Pt notes family hx of fibroids.  Pt was on OCPs in the past, but had difficulty taking them on time when she travelled out of the country.  Pt seen by OB/Gyn.  Patient also notes continued anxiety and depression.  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  Currently on Paxil 20 mg.  States she started crying while watching a movie.  Also endorses difficulty falling asleep.  May take melatonin 5 mg at 11 PM or NyQuil.  Past Medical History:  Diagnosis Date  . Allergy   . Asthma     Allergies  Allergen Reactions  . Pollen Extract Cough and Other (See Comments)  . Other     Allergic to cats    ROS General: Denies fever, chills, night sweats, changes in weight, changes in appetite  +fatigue, insomnia, lightheadedness HEENT: Denies headaches, ear pain, changes in vision, rhinorrhea, sore throat CV: Denies CP, palpitations, SOB, orthopnea  +HAs Pulm: Denies SOB, cough, wheezing GI: Denies abdominal pain, nausea, vomiting, diarrhea, constipation GU: Denies dysuria, hematuria, frequency, vaginal discharge  +heavy menses with clots Msk: Denies muscle cramps, joint pains Neuro: Denies weakness, numbness, tingling Skin: Denies rashes, bruising Psych: Denies SI/HI,hallucinations  +anxiety and depression.      Objective:    Blood pressure 98/76, pulse 67, temperature 97.6 F (36.4 C),  temperature source Temporal, weight 173 lb (78.5 kg), last menstrual period 12/25/2019, SpO2 97 %.  Gen. Pleasant, well-nourished, in no distress, normal affect  HEENT: [place]/AT, face symmetric, conjunctiva clear, no scleral icterus, PERRLA, EOMI, nares patent without drainage Lungs: no accessory muscle use, CTAB, no wheezes or rales Cardiovascular: RRR, no m/r/g, no peripheral edema Abdomen: BS present, soft, ND, mild TTP of LLQ, no hepatosplenomegaly. Musculoskeletal: No deformities, no cyanosis or clubbing, normal tone Neuro:  A&Ox3, CN II-XII intact, normal gait Skin:  Warm, no lesions/ rash   Wt Readings from Last 3 Encounters:  12/25/19 173 lb (78.5 kg)  10/13/19 174 lb 1.6 oz (79 kg)  05/08/19 170 lb (77.1 kg)    Lab Results  Component Value Date   WBC 6.8 10/13/2019   HGB 10.5 (L) 10/13/2019   HCT 32.7 (L) 10/13/2019   PLT 353.0 10/13/2019    Assessment/Plan:  Iron deficiency anemia, unspecified iron deficiency anemia type  -Concerns for increasing/symptomatic anemia -Discussed various causes including menorrhagia, fibroids, polyps, and adequate dietary intake -We will obtain labs -Continue iron supplements twice daily -Will adjust therapy if needed based on labs -Given precautions - Plan: CBC (no diff), Iron and TIBC, Ferritin  Menorrhagia with regular cycle -Possibly: Worsening anemia -We will obtain labs -Discussed various birth control options -Continue follow-up with GYN -Given handout - Plan: CBC (no diff), Iron and TIBC, Ferritin  Insomnia, unspecified type -Discussed various causes of insomnia -Continue sleep hygiene -Try taking melatonin earlier in the evening -We will reassess at next [facility]  Anxiety  and depression  -PHQ-9 score 18.  Score was 18 on 05/03/2019 -GAD-7 score 17   score was 20 on 05/03/2019 -Discussed increasing Paxil versus switching medication.  Pt willing to increase dose to 30 mg. -We will discontinue Paxil 20 mg. -Discussed  counseling.  Pt encouraged to make her own appointment. -Given handout -We will continue to monitor.  Follow-up in 4-6 weeks sooner if needed - Plan: PARoxetine (PAXIL) 30 MG tablet  Other fatigue  - Plan: CBC (no diff), TSH, Vitamin D, 25-hydroxy, T4, Free  F/u as needed for acute concerns.  We will follow-up in 4 to 6 weeks for chronic condition(s)  Abbe Amsterdam, MD

## 2019-12-25 NOTE — Patient Instructions (Addendum)
Insomnia Insomnia is a sleep disorder that makes it difficult to fall asleep or stay asleep. Insomnia can cause fatigue, low energy, difficulty concentrating, mood swings, and poor performance at work or school. There are three different ways to classify insomnia:  Difficulty falling asleep.  Difficulty staying asleep.  Waking up too early in the morning. Any type of insomnia can be long-term (chronic) or short-term (acute). Both are common. Short-term insomnia usually lasts for three months or less. Chronic insomnia occurs at least three times a week for longer than three months. What are the causes? Insomnia may be caused by another condition, situation, or substance, such as:  Anxiety.  Certain medicines.  Gastroesophageal reflux disease (GERD) or other gastrointestinal conditions.  Asthma or other breathing conditions.  Restless legs syndrome, sleep apnea, or other sleep disorders.  Chronic pain.  Menopause.  Stroke.  Abuse of alcohol, tobacco, or illegal drugs.  Mental health conditions, such as depression.  Caffeine.  Neurological disorders, such as Alzheimer's disease.  An overactive thyroid (hyperthyroidism). Sometimes, the cause of insomnia may not be known. What increases the risk? Risk factors for insomnia include:  Gender. Women are affected more often than men.  Age. Insomnia is more common as you get older.  Stress.  Lack of exercise.  Irregular work schedule or working night shifts.  Traveling between different time zones.  Certain medical and mental health conditions. What are the signs or symptoms? If you have insomnia, the main symptom is having trouble falling asleep or having trouble staying asleep. This may lead to other symptoms, such as:  Feeling  fatigued or having low energy.  Feeling nervous about going to sleep.  Not feeling rested in the morning.  Having trouble concentrating.  Feeling irritable, anxious, or depressed. How is this diagnosed? This condition may be diagnosed based on:  Your symptoms and medical history. Your health care provider may ask about: ? Your sleep habits. ? Any medical conditions you have. ? Your mental health.  A physical exam. How is this treated? Treatment for insomnia depends on the cause. Treatment may focus on treating an underlying condition that is causing insomnia. Treatment may also include:  Medicines to help you sleep.  Counseling or therapy.  Lifestyle adjustments to help you sleep better. Follow these instructions at home: Eating and drinking   Limit or avoid alcohol, caffeinated beverages, and cigarettes, especially close to bedtime. These can disrupt your sleep.  Do not eat a large meal or eat spicy foods right before bedtime. This can lead to digestive discomfort that can make it hard for you to sleep. Sleep habits   Keep a sleep diary to help you and your health care provider figure out what could be causing your insomnia. Write down: ? When you sleep. ? When you wake up during the night. ? How well you sleep. ? How rested you feel the next day. ? Any side effects of medicines you are taking. ? What  you eat and drink.  Make your bedroom a dark, comfortable place where it is easy to fall asleep. ? Put up shades or blackout curtains to block light from outside. ? Use a white noise machine to block noise. ? Keep the temperature cool.  Limit screen use before bedtime. This includes: ? Watching TV. ? Using your smartphone, tablet, or computer.  Stick to a routine that includes going to bed and waking up at the same times every day and night. This can help you fall asleep faster. Consider making a quiet activity, such as reading, part of your nighttime  routine.  Try to avoid taking naps during the day so that you sleep better at night.  Get out of bed if you are still awake after 15 minutes of trying to sleep. Keep the lights down, but try reading or doing a quiet activity. When you feel sleepy, go back to bed. General instructions  Take over-the-counter and prescription medicines only as told by your health care provider.  Exercise regularly, as told by your health care provider. Avoid exercise starting several hours before bedtime.  Use relaxation techniques to manage stress. Ask your health care provider to suggest some techniques that may work well for you. These may include: ? Breathing exercises. ? Routines to release muscle tension. ? Visualizing peaceful scenes.  Make sure that you drive carefully. Avoid driving if you feel very sleepy.  Keep all follow-up visits as told by your health care provider. This is important. Contact a health care provider if:  You are tired throughout the day.  You have trouble in your daily routine due to sleepiness.  You continue to have sleep problems, or your sleep problems get worse. Get help right away if:  You have serious thoughts about hurting yourself or someone else. If you ever feel like you may hurt yourself or others, or have thoughts about taking your own life, get help right away. You can go to your nearest emergency department or call:  Your local emergency services (911 in the [place]  A suicide crisis helpline, such as the [facility] at 202-265-4370. This is open 24 hours a day. Summary  Insomnia is a sleep disorder that makes it difficult to fall asleep or stay asleep.  Insomnia can be long-term (chronic) or short-term (acute).  Treatment for insomnia depends on the cause. Treatment may focus on treating an underlying condition that is causing insomnia.  Keep a sleep diary to help you and your health care provider figure out what could be  causing your insomnia. This information is not intended to replace advice given to you by your health care provider. Make sure you discuss any questions you have with your health care provider. Document Revised: 08/13/2017 Document Reviewed: 06/10/2017 Elsevier Patient Education  2020 [employer].  Menorrhagia  Menorrhagia is a condition in which menstrual periods are heavy or last longer than normal. With menorrhagia, most periods a woman has may cause enough blood loss and cramping that she becomes unable to take part in her usual activities. What are the causes? Common causes of this condition include:  Noncancerous growths in the uterus (polyps or fibroids).  An imbalance of the estrogen and progesterone hormones.  One of the ovaries not releasing an egg during one or more months.  A problem with the thyroid gland (hypothyroid).  Side effects of having an intrauterine device (IUD).  Side effects of some medicines, such as anti-inflammatory medicines or blood thinners.  A bleeding disorder that stops the blood from clotting normally. In some cases, the cause of this condition is not known. What are the signs or symptoms? Symptoms of this condition include:  Routinely having to change your pad or tampon every 1-2 hours because it is completely soaked.  Needing to use pads and tampons at the same time because of heavy bleeding.  Needing to wake up to change your pads or tampons during the night.  Passing blood clots larger than 1 inch (2.5 cm) in size.  Having bleeding that lasts for more than 7 days.  Having symptoms of low iron levels (anemia), such as tiredness, fatigue, or shortness of breath. How is this diagnosed? This condition may be diagnosed based on:  A physical exam.  Your symptoms and menstrual history.  Tests, such as: ? Blood tests to check if you are pregnant or have hormonal changes, a bleeding or thyroid disorder, anemia, or other problems. ? Pap  test to check for cancerous changes, infections, or inflammation. ? Endometrial biopsy. This test involves removing a tissue sample from the lining of the uterus (endometrium) to be examined under a microscope. ? Pelvic ultrasound. This test uses sound waves to create images of your uterus, ovaries, and vagina. The images can show if you have fibroids or other growths. ? Hysteroscopy. For this test, a small telescope is used to look inside your uterus. How is this treated? Treatment may not be needed for this condition. If it is needed, the best treatment for you will depend on:  Whether you need to prevent pregnancy.  Your desire to have children in the future.  The cause and severity of your bleeding.  Your personal preference. Medicines are the first step in treatment. You may be treated with:  Hormonal birth control methods. These treatments reduce bleeding during your menstrual period. They include: ? Birth control pills. ? Skin patch. ? Vaginal ring. ? Shots (injections) that you get every 3 months. ? Hormonal IUD (intrauterine device). ? Implants that go under the skin.  Medicines that thicken blood and slow bleeding.  Medicines that reduce swelling, such as ibuprofen.  Medicines that contain an artificial (synthetic) hormone called progestin.  Medicines that make the ovaries stop working for a short time.  Iron supplements to treat anemia. If medicines do not work, surgery may be done. Surgical options may include:  Dilation and curettage (D&C). In this procedure, your health care provider opens (dilates) your cervix and then scrapes or suctions tissue from the endometrium to reduce menstrual bleeding.  Operative hysteroscopy. In this procedure, a small tube with a light on the end (hysteroscope) is used to view your uterus and help remove polyps that may be causing heavy periods.  Endometrial ablation. This is when various techniques are used to permanently destroy  your entire endometrium. After endometrial ablation, most women have little or no menstrual flow. This procedure reduces your ability to become pregnant.  Endometrial resection. In this procedure, an electrosurgical wire loop is used to remove the endometrium. This procedure reduces your ability to become pregnant.  Hysterectomy. This is surgical removal of the uterus. This is a permanent procedure that stops menstrual periods. Pregnancy is not possible after a hysterectomy. Follow these instructions at home: Medicines  Take over-the-counter and prescription medicines exactly as told by your health care provider. This includes iron pills.  Do not change or switch medicines without asking your health care provider.  Do not take aspirin or medicines that contain  aspirin 1 week before or during your menstrual period. Aspirin may make bleeding worse. General instructions  If you need to change your sanitary pad or tampon more than once every 2 hours, limit your activity until the bleeding stops.  Iron pills can cause constipation. To prevent or treat constipation while you are taking prescription iron supplements, your health care provider may recommend that you: ? Drink enough fluid to keep your urine clear or pale yellow. ? Take over-the-counter or prescription medicines. ? Eat foods that are high in fiber, such as fresh fruits and vegetables, whole grains, and beans. ? Limit foods that are high in fat and processed sugars, such as fried and sweet foods.  Eat well-balanced meals, including foods that are high in iron. Foods that have a lot of iron include leafy green vegetables, meat, liver, eggs, and whole grain breads and cereals.  Do not try to lose weight until the abnormal bleeding has stopped and your blood iron level is back to normal. If you need to lose weight, work with your health care provider to lose weight safely.  Keep all follow-up visits as told by your health care provider.  This is important. Contact a health care provider if:  You soak through a pad or tampon every 1 or 2 hours, and this happens every time you have a period.  You need to use pads and tampons at the same time because you are bleeding so much.  You have nausea, vomiting, diarrhea, or other problems related to medicines you are taking. Get help right away if:  You soak through more than a pad or tampon in 1 hour.  You pass clots bigger than 1 inch (2.5 cm) wide.  You feel short of breath.  You feel like your heart is beating too fast.  You feel dizzy or faint.  You feel very weak or tired. Summary  Menorrhagia is a condition in which menstrual periods are heavy or last longer than normal.  Treatment will depend on the cause of the condition and may include medicines or procedures.  Take over-the-counter and prescription medicines exactly as told by your health care provider. This includes iron pills.  Get help right away if you have heavy bleeding that soaks through more than a pad or tampon in 1 hour, you are passing large clots, or you feel dizzy, faint or short of breath. This information is not intended to replace advice given to you by your health care provider. Make sure you discuss any questions you have with your health care provider. Document Revised: 12/08/2017 Document Reviewed: 08/24/2016 Elsevier Patient Education  2020 [employer].

## 2019-12-26 LAB — IRON, TOTAL/TOTAL IRON BINDING CAP
%SAT: 17 % (calc) (ref 16–45)
Iron: 49 ug/dL (ref 40–190)
TIBC: 292 mcg/dL (calc) (ref 250–450)

## 2020-01-29 ENCOUNTER — Other Ambulatory Visit: Payer: Self-pay

## 2020-01-29 ENCOUNTER — Encounter: Payer: Self-pay | Admitting: Family Medicine

## 2020-01-29 ENCOUNTER — Ambulatory Visit (INDEPENDENT_AMBULATORY_CARE_PROVIDER_SITE_OTHER): Payer: BC Managed Care – PPO | Admitting: Family Medicine

## 2020-01-29 VITALS — BP 102/78 | HR 70 | Temp 97.6°F | Wt 172.0 lb

## 2020-01-29 DIAGNOSIS — E559 Vitamin D deficiency, unspecified: Secondary | ICD-10-CM

## 2020-01-29 DIAGNOSIS — H6981 Other specified disorders of Eustachian tube, right ear: Secondary | ICD-10-CM

## 2020-01-29 DIAGNOSIS — D509 Iron deficiency anemia, unspecified: Secondary | ICD-10-CM | POA: Diagnosis not present

## 2020-01-29 DIAGNOSIS — F325 Major depressive disorder, single episode, in full remission: Secondary | ICD-10-CM

## 2020-01-29 DIAGNOSIS — F419 Anxiety disorder, unspecified: Secondary | ICD-10-CM

## 2020-01-29 MED ORDER — FLUTICASONE PROPIONATE 50 MCG/ACT NA SUSP: 1.0000 | Freq: Every day | NASAL | 1 refills | Status: DC

## 2020-01-29 MED ORDER — PAROXETINE HCL 30 MG PO TABS: 30.0000 mg | ORAL_TABLET | Freq: Every day | ORAL | 1 refills | Status: DC

## 2020-01-29 NOTE — Progress Notes (Addendum)
Subjective:    Patient ID: Donna Montgomery, female    DOB: 03/22/1996, 24 y.o.   MRN: [id number]  No chief complaint on file.   HPI Patient was seen today for f/u.  Pt endorses improvement in energy, mood, anxiety and depression since starting Vitamin D weekly, iron, and Paxil 30 mg.   Pt denies constipation on daily iron supplement, for hgb 11.9.  Pt was able to try out for the [facility] team without feeling anxious.  Pt does note increased pressure in R ear after flying.  Denies rhinorrhea, HAs, sore throat, facial pain or pressure.  Pt takes singulair for allergies.   Past Medical History:  Diagnosis Date  . Allergy   . Asthma     Allergies  Allergen Reactions  . Pollen Extract Cough and Other (See Comments)  . Other     Allergic to cats    ROS General: Denies fever, chills, night sweats, changes in weight, changes in appetite HEENT: Denies headaches, ear pain, changes in vision, rhinorrhea, sore throat  +increased R ear pressure CV: Denies CP, palpitations, SOB, orthopnea Pulm: Denies SOB, cough, wheezing GI: Denies abdominal pain, nausea, vomiting, diarrhea, constipation GU: Denies dysuria, hematuria, frequency, vaginal discharge Msk: Denies muscle cramps, joint pains Neuro: Denies weakness, numbness, tingling Skin: Denies rashes, bruising Psych: Denies hallucinations  +anxiety and depression     Objective:    Blood pressure 102/78, pulse 70, temperature 97.6 F (36.4 C), temperature source Temporal, weight 172 lb (78 kg), SpO2 98 %.  Gen. Pleasant, well-nourished, in no distress, normal affect  HEENT: [place]/AT, face symmetric, conjunctiva normal, no scleral icterus, PERRLA, EOMI, nares patent without drainage.  L TM and canal normal.  R canal normal, R TM full with clear fluid, no air fluid level or erythema. Lungs: no accessory muscle use Cardiovascular: RRR, no m/r/g, no peripheral edema Musculoskeletal: No deformities, no cyanosis or clubbing, normal tone Neuro:   A&Ox3, CN II-XII intact, normal gait Skin:  Warm, no lesions/ rash  Wt Readings from Last 3 Encounters:  01/29/20 172 lb (78 kg)  12/25/19 173 lb (78.5 kg)  10/13/19 174 lb 1.6 oz (79 kg)    Lab Results  Component Value Date   WBC 4.8 12/25/2019   HGB 11.9 (L) 12/25/2019   HCT 35.5 (L) 12/25/2019   PLT 302.0 12/25/2019   TSH 1.99 12/25/2019    Assessment/Plan:  Eustachian tube dysfunction, right  -disscused treatment options -given handout -continue singulair.  Can use flonase prn -also consider facial massage and other way to relieve pressure - Plan: fluticasone (FLONASE) 50 MCG/ACT nasal spray  Anxiety  -GAD 7 score 1.  Was 17 on 12/25/19 -consider counseling - Plan: PARoxetine (PAXIL) 30 MG tablet  Depression, major, single episode, complete remission (HCC)  -PHQ 9 score 2.  Was 18 on 12/25/19 -consider counseling - Plan: PARoxetine (PAXIL) 30 MG tablet  Iron deficiency anemia, unspecified iron deficiency anemia type -improving -continue daily iron with vit c -consider eating iron rich foods if does not wish to take a supplement -will recheck at next OFV  Vitamin D deficiency -continue ergocalciferol 50,000 IU wkly -will recheck at next OFV  F/u in 2-3 months  Abbe Amsterdam, MD

## 2020-01-29 NOTE — Patient Instructions (Signed)
Eustachian Tube Dysfunction  Eustachian tube dysfunction refers to a condition in which a blockage develops in the narrow passage that connects the middle ear to the back of the nose (eustachian tube). The eustachian tube regulates air pressure in the middle ear by letting air move between the ear and nose. It also helps to drain fluid from the middle ear space. Eustachian tube dysfunction can affect one or both ears. When the eustachian tube does not function properly, air pressure, fluid, or both can build up in the middle ear. What are the causes? This condition occurs when the eustachian tube becomes blocked or cannot open normally. Common causes of this condition include:  Ear infections.  Colds and other infections that affect the nose, mouth, and throat (upper respiratory tract).  Allergies.  Irritation from cigarette smoke.  Irritation from stomach acid coming up into the esophagus (gastroesophageal reflux). The esophagus is the tube that carries food from the mouth to the stomach.  Sudden changes in air pressure, such as from descending in an airplane or scuba diving.  Abnormal growths in the nose or throat, such as: ? Growths that line the nose (nasal polyps). ? Abnormal growth of cells (tumors). ? Enlarged tissue at the back of the throat (adenoids). What increases the risk? You are more likely to develop this condition if:  You smoke.  You are overweight.  You are a child who has: ? Certain birth defects of the mouth, such as cleft palate. ? Large tonsils or adenoids. What are the signs or symptoms? Common symptoms of this condition include:  A feeling of fullness in the ear.  Ear pain.  Clicking or popping noises in the ear.  Ringing in the ear.  Hearing loss.  Loss of balance.  Dizziness. Symptoms may get worse when the air pressure around you changes, such as when you travel to an area of high elevation, fly on an airplane, or go scuba diving. How is  this diagnosed? This condition may be diagnosed based on:  Your symptoms.  A physical exam of your ears, nose, and throat.  Tests, such as those that measure: ? The movement of your eardrum (tympanogram). ? Your hearing (audiometry). How is this treated? Treatment depends on the cause and severity of your condition.  In mild cases, you may relieve your symptoms by moving air into your ears. This is called "popping the ears."  In more severe cases, or if you have symptoms of fluid in your ears, treatment may include: ? Medicines to relieve congestion (decongestants). ? Medicines that treat allergies (antihistamines). ? Nasal sprays or ear drops that contain medicines that reduce swelling (steroids). ? A procedure to drain the fluid in your eardrum (myringotomy). In this procedure, a small tube is placed in the eardrum to:  Drain the fluid.  Restore the air in the middle ear space. ? A procedure to insert a balloon device through the nose to inflate the opening of the eustachian tube (balloon dilation). Follow these instructions at home: Lifestyle  Do not do any of the following until your health care provider approves: ? Travel to high altitudes. ? Fly in airplanes. ? Work in a pressurized cabin or room. ? Scuba dive.  Do not use any products that contain nicotine or tobacco, such as cigarettes and e-cigarettes. If you need help quitting, ask your health care provider.  Keep your ears dry. Wear fitted earplugs during showering and bathing. Dry your ears completely after. General instructions  Take over-the-counter   and prescription medicines only as told by your health care provider.  Use techniques to help pop your ears as recommended by your health care provider. These may include: ? Chewing gum. ? Yawning. ? Frequent, forceful swallowing. ? Closing your mouth, holding your nose closed, and gently blowing as if you are trying to blow air out of your nose.  Keep all  follow-up visits as told by your health care provider. This is important. Contact a health care provider if:  Your symptoms do not go away after treatment.  Your symptoms come back after treatment.  You are unable to pop your ears.  You have: ? A fever. ? Pain in your ear. ? Pain in your head or neck. ? Fluid draining from your ear.  Your hearing suddenly changes.  You become very dizzy.  You lose your balance. Summary  Eustachian tube dysfunction refers to a condition in which a blockage develops in the eustachian tube.  It can be caused by ear infections, allergies, inhaled irritants, or abnormal growths in the nose or throat.  Symptoms include ear pain, hearing loss, or ringing in the ears.  Mild cases are treated with maneuvers to unblock the ears, such as yawning or ear popping.  Severe cases are treated with medicines. Surgery may also be done (rare). This information is not intended to replace advice given to you by your health care provider. Make sure you discuss any questions you have with your health care provider. Document Revised: 12/21/2017 Document Reviewed: 12/21/2017 Elsevier Patient Education  2020 [employer].  Iron Deficiency Anemia, Adult Iron-deficiency anemia is when you have a low amount of red blood cells or hemoglobin. This happens because you have too little iron in your body. Hemoglobin carries oxygen to parts of the body. Anemia can cause your body to not get enough oxygen. It may or may not cause symptoms. Follow these instructions at home: Medicines  Take over-the-counter and prescription medicines only as told by your doctor. This includes iron pills (supplements) and vitamins.  If you cannot handle taking iron pills by mouth, ask your doctor about getting iron through: ? A vein (intravenously). ? A shot (injection) into a muscle.  Take iron pills when your stomach is empty. If you cannot handle this, take them with food.  Do not drink  milk or take antacids at the same time as your iron pills.  To prevent trouble pooping (constipation), eat fiber or take medicine (stool softener) as told by your doctor. Eating and drinking   Talk with your doctor before changing the foods you eat. He or she may tell you to eat foods that have a lot of iron, such as: ? Liver. ? Lowfat (lean) beef. ? Breads and cereals that have iron added to them (fortified breads and cereals). ? Eggs. ? Dried fruit. ? Dark green, leafy vegetables.  Drink enough fluid to keep your pee (urine) clear or pale yellow.  Eat fresh fruits and vegetables that are high in vitamin C. They help your body to use iron. Foods with a lot of vitamin C include: ? Oranges. ? Peppers. ? Tomatoes. ? Mangoes. General instructions  Return to your normal activities as told by your doctor. Ask your doctor what activities are safe for you.  Keep yourself clean, and keep things clean around you (your surroundings). Anemia can make you get sick more easily.  Keep all follow-up visits as told by your doctor. This is important. Contact a doctor if:  You  feel sick to your stomach (nauseous).  You throw up (vomit).  You feel weak.  You are sweating for no clear reason.  You have trouble pooping, such as: ? Pooping (having a bowel movement) less than 3 times a week. ? Straining to poop. ? Having poop that is hard, dry, or larger than normal. ? Feeling full or bloated. ? Pain in the lower belly. ? Not feeling better after pooping. Get help right away if:  You pass out (faint). If this happens, do not drive yourself to the hospital. Call your local emergency services (911 in the [place]  You have chest pain.  You have shortness of breath that: ? Is very bad. ? Gets worse with physical activity.  You have a fast heartbeat.  You get light-headed when getting up from sitting or lying down. This information is not intended to replace advice given to you by your  health care provider. Make sure you discuss any questions you have with your health care provider. Document Revised: 08/13/2017 Document Reviewed: 05/20/2016 Elsevier Patient Education  2020 [employer] Inc[employer]  Managing Anxiety, Adult After being diagnosed with an anxiety disorder, you may be relieved to know why you have felt or behaved a certain way. You may also feel overwhelmed about the treatment ahead and what it will mean for your life. With care and support, you can manage this condition and recover from it. How to manage lifestyle changes Managing stress and anxiety  Stress is your body's reaction to life changes and events, both good and bad. Most stress will last just a few hours, but stress can be ongoing and can lead to more than just stress. Although stress can play a major role in anxiety, it is not the same as anxiety. Stress is usually caused by something external, such as a deadline, test, or competition. Stress normally passes after the triggering event has ended.  Anxiety is caused by something internal, such as imagining a terrible outcome or worrying that something will go wrong that will devastate you. Anxiety often does not go away even after the triggering event is over, and it can become long-term (chronic) worry. It is important to understand the differences between stress and anxiety and to manage your stress effectively so that it does not lead to an anxious response. Talk with your health care provider or a counselor to learn more about reducing anxiety and stress. He or she may suggest tension reduction techniques, such as:  Music therapy. This can include creating or listening to music that you enjoy and that inspires you.  Mindfulness-based meditation. This involves being aware of your normal breaths while not trying to control your breathing. It can be done while sitting or walking.  Centering prayer. This involves focusing on a word, phrase, or sacred image that means  something to you and brings you peace.  Deep breathing. To do this, expand your stomach and inhale slowly through your nose. Hold your breath for 3-5 seconds. Then exhale slowly, letting your stomach muscles relax.  Self-talk. This involves identifying thought patterns that lead to anxiety reactions and changing those patterns.  Muscle relaxation. This involves tensing muscles and then relaxing them. Choose a tension reduction technique that suits your lifestyle and personality. These techniques take time and practice. Set aside 5-15 minutes a day to do them. Therapists can offer counseling and training in these techniques. The training to help with anxiety may be covered by some insurance plans. Other things you can do  to manage stress and anxiety include:  Keeping a stress/anxiety diary. This can help you learn what triggers your reaction and then learn ways to manage your response.  Thinking about how you react to certain situations. You may not be able to control everything, but you can control your response.  Making time for activities that help you relax and not feeling guilty about spending your time in this way.  Visual imagery and yoga can help you stay calm and relax.  Medicines Medicines can help ease symptoms. Medicines for anxiety include:  Anti-anxiety drugs.  Antidepressants. Medicines are often used as a primary treatment for anxiety disorder. Medicines will be prescribed by a health care provider. When used together, medicines, psychotherapy, and tension reduction techniques may be the most effective treatment. Relationships Relationships can play a big part in helping you recover. Try to spend more time connecting with trusted friends and family members. Consider going to couples counseling, taking family education classes, or going to family therapy. Therapy can help you and others better understand your condition. How to recognize changes in your anxiety Everyone  responds differently to treatment for anxiety. Recovery from anxiety happens when symptoms decrease and stop interfering with your daily activities at home or work. This may mean that you will start to:  Have better concentration and focus. Worry will interfere less in your daily thinking.  Sleep better.  Be less irritable.  Have more energy.  Have improved memory. It is important to recognize when your condition is getting worse. Contact your health care provider if your symptoms interfere with home or work and you feel like your condition is not improving. Follow these instructions at home: Activity  Exercise. Most adults should do the following: ? Exercise for at least 150 minutes each week. The exercise should increase your heart rate and make you sweat (moderate-intensity exercise). ? Strengthening exercises at least twice a week.  Get the right amount and quality of sleep. Most adults need 7-9 hours of sleep each night. Lifestyle   Eat a healthy diet that includes plenty of vegetables, fruits, whole grains, low-fat dairy products, and lean protein. Do not eat a lot of foods that are high in solid fats, added sugars, or salt.  Make choices that simplify your life.  Do not use any products that contain nicotine or tobacco, such as cigarettes, e-cigarettes, and chewing tobacco. If you need help quitting, ask your health care provider.  Avoid caffeine, alcohol, and certain over-the-counter cold medicines. These may make you feel worse. Ask your pharmacist which medicines to avoid. General instructions  Take over-the-counter and prescription medicines only as told by your health care provider.  Keep all follow-up visits as told by your health care provider. This is important. Where to find support You can get help and support from these sources:  Self-help groups.  Online and [employer] organizations.  A trusted spiritual leader.  Couples counseling.  Family education  classes.  Family therapy. Where to find more information You may find that joining a support group helps you deal with your anxiety. The following sources can help you locate counselors or support groups near you:  Mental Health [place]: www.mentalhealthamerica.net  Anxiety [employer] Association [place] (ADAA): ProgramCam.dewww.adaa.org  [employer] on Mental Illness (NAMI): www.nami.org Contact a health care provider if you:  Have a hard time staying focused or finishing daily tasks.  Spend many hours a day feeling worried about everyday life.  Become exhausted by worry.  Start to  have headaches, feel tense, or have nausea.  Urinate more than normal.  Have diarrhea. Get help right away if you have:  A racing heart and shortness of breath.  Thoughts of hurting yourself or others. If you ever feel like you may hurt yourself or others, or have thoughts about taking your own life, get help right away. You can go to your nearest emergency department or call:  Your local emergency services (911 in the [place]  A suicide crisis helpline, such as the [facility] at 207 396 0728. This is open 24 hours a day. Summary  Taking steps to learn and use tension reduction techniques can help calm you and help prevent triggering an anxiety reaction.  When used together, medicines, psychotherapy, and tension reduction techniques may be the most effective treatment.  Family, friends, and partners can play a big part in helping you recover from an anxiety disorder. This information is not intended to replace advice given to you by your health care provider. Make sure you discuss any questions you have with your health care provider. Document Revised: 01/31/2019 Document Reviewed: 01/31/2019 Elsevier Patient Education  [place] With Depression Everyone experiences occasional disappointment, sadness, and loss in their lives. When you are feeling  down, blue, or sad for at least 2 weeks in a row, it may mean that you have depression. Depression can affect your thoughts and feelings, relationships, daily activities, and physical health. It is caused by changes in the way your brain functions. If you receive a diagnosis of depression, your health care provider will tell you which type of depression you have and what treatment options are available to you. If you are living with depression, there are ways to help you recover from it and also ways to prevent it from coming back. How to cope with lifestyle changes Coping with stress     Stress is your body's reaction to life changes and events, both good and bad. Stressful situations may include:  Getting married.  The death of a spouse.  Losing a job.  Retiring.  Having a baby. Stress can last just a few hours or it can be ongoing. Stress can play a major role in depression, so it is important to learn both how to cope with stress and how to think about it differently. Talk with your health care provider or a counselor if you would like to learn more about stress reduction. He or she may suggest some stress reduction techniques, such as:  Music therapy. This can include creating music or listening to music. Choose music that you enjoy and that inspires you.  Mindfulness-based meditation. This kind of meditation can be done while sitting or walking. It involves being aware of your normal breaths, rather than trying to control your breathing.  Centering prayer. This is a kind of meditation that involves focusing on a spiritual word or phrase. Choose a word, phrase, or sacred image that is meaningful to you and that brings you peace.  Deep breathing. To do this, expand your stomach and inhale slowly through your nose. Hold your breath for 3-5 seconds, then exhale slowly, allowing your stomach muscles to relax.  Muscle relaxation. This involves intentionally tensing muscles then relaxing  them. Choose a stress reduction technique that fits your lifestyle and personality. Stress reduction techniques take time and practice to develop. Set aside 5-15 minutes a day to do them. Therapists can offer training in these techniques. The training may be covered by  some insurance plans. Other things you can do to manage stress include:  Keeping a stress diary. This can help you learn what triggers your stress and ways to control your response.  Understanding what your limits are and saying no to requests or events that lead to a schedule that is too full.  Thinking about how you respond to certain situations. You may not be able to control everything, but you can control how you react.  Adding humor to your life by watching funny films or TV shows.  Making time for activities that help you relax and not feeling guilty about spending your time this way.  Medicines Your health care provider may suggest certain medicines if he or she feels that they will help improve your condition. Avoid using alcohol and other substances that may prevent your medicines from working properly (may interact). It is also important to:  Talk with your pharmacist or health care provider about all the medicines that you take, their possible side effects, and what medicines are safe to take together.  Make it your goal to take part in all treatment decisions (shared decision-making). This includes giving input on the side effects of medicines. It is best if shared decision-making with your health care provider is part of your total treatment plan. If your health care provider prescribes a medicine, you may not notice the full benefits of it for 4-8 weeks. Most people who are treated for depression need to be on medicine for at least 6-12 months after they feel better. If you are taking medicines as part of your treatment, do not stop taking medicines without first talking to your health care provider. You may need to  have the medicine slowly decreased (tapered) over time to decrease the risk of harmful side effects. Relationships Your health care provider may suggest family therapy along with individual therapy and drug therapy. While there may not be family problems that are causing you to feel depressed, it is still important to make sure your family learns as much as they can about your mental health. Having your family's support can help make your treatment successful. How to recognize changes in your condition Everyone has a different response to treatment for depression. Recovery from major depression happens when you have not had signs of major depression for two months. This may mean that you will start to:  Have more interest in doing activities.  Feel less hopeless than you did 2 months ago.  Have more energy.  Overeat less often, or have better or improving appetite.  Have better concentration. Your health care provider will work with you to decide the next steps in your recovery. It is also important to recognize when your condition is getting worse. Watch for these signs:  Having fatigue or low energy.  Eating too much or too little.  Sleeping too much or too little.  Feeling restless, agitated, or hopeless.  Having trouble concentrating or making decisions.  Having unexplained physical complaints.  Feeling irritable, angry, or aggressive. Get help as soon as you or your family members notice these symptoms coming back. How to get support and help from others How to talk with friends and family members about your condition  Talking to friends and family members about your condition can provide you with one way to get support and guidance. Reach out to trusted friends or family members, explain your symptoms to them, and let them know that you are working with a health care provider  to treat your depression. Financial resources Not all insurance plans cover mental health care, so  it is important to check with your insurance carrier. If paying for co-pays or counseling services is a problem, search for a local or county mental health care center. They may be able to offer public mental health care services at low or no cost when you are not able to see a private health care provider. If you are taking medicine for depression, you may be able to get the generic form, which may be less expensive. Some makers of prescription medicines also offer help to patients who cannot afford the medicines they need. Follow these instructions at home:   Get the right amount and quality of sleep.  Cut down on using caffeine, tobacco, alcohol, and other potentially harmful substances.  Try to exercise, such as walking or lifting small weights.  Take over-the-counter and prescription medicines only as told by your health care provider.  Eat a healthy diet that includes plenty of vegetables, fruits, whole grains, low-fat dairy products, and lean protein. Do not eat a lot of foods that are high in solid fats, added sugars, or salt.  Keep all follow-up visits as told by your health care provider. This is important. Contact a health care provider if:  You stop taking your antidepressant medicines, and you have any of these symptoms: ? Nausea. ? Headache. ? Feeling lightheaded. ? Chills and body aches. ? Not being able to sleep (insomnia).  You or your friends and family think your depression is getting worse. Get help right away if:  You have thoughts of hurting yourself or others. If you ever feel like you may hurt yourself or others, or have thoughts about taking your own life, get help right away. You can go to your nearest emergency department or call:  Your local emergency services (911 in the [place]  A suicide crisis helpline, such as the National Suicide Prevention Lifeline at (225)192-7111. This is open 24-hours a day. Summary  If you are living with depression, there  are ways to help you recover from it and also ways to prevent it from coming back.  Work with your health care team to create a management plan that includes counseling, stress management techniques, and healthy lifestyle habits. This information is not intended to replace advice given to you by your health care provider. Make sure you discuss any questions you have with your health care provider. Document Revised: 12/23/2018 Document Reviewed: 08/03/2016 Elsevier Patient Education  2020 [employer].

## 2020-03-15 ENCOUNTER — Encounter: Payer: Self-pay | Admitting: Family Medicine

## 2020-03-25 ENCOUNTER — Encounter: Payer: Self-pay | Admitting: Family Medicine

## 2020-03-25 ENCOUNTER — Telehealth (INDEPENDENT_AMBULATORY_CARE_PROVIDER_SITE_OTHER): Payer: BC Managed Care – PPO | Admitting: Family Medicine

## 2020-03-25 VITALS — Ht 77.0 in | Wt 175.0 lb

## 2020-03-25 DIAGNOSIS — F339 Major depressive disorder, recurrent, unspecified: Secondary | ICD-10-CM | POA: Diagnosis not present

## 2020-03-25 DIAGNOSIS — F419 Anxiety disorder, unspecified: Secondary | ICD-10-CM | POA: Diagnosis not present

## 2020-03-25 MED ORDER — MONTELUKAST SODIUM 10 MG PO TABS: 10.0000 mg | ORAL_TABLET | Freq: Every day | ORAL | 3 refills | Status: DC

## 2020-03-25 MED ORDER — SERTRALINE HCL 50 MG PO TABS: ORAL_TABLET | ORAL | 1 refills | Status: DC

## 2020-03-25 NOTE — Progress Notes (Signed)
Virtual Visit via Telephone Note  I connected with Donna Montgomery on 03/25/20 at  4:00 PM EDT by telephone and verified that I am speaking with the correct person using two identifiers.   Attempted to connect with patient via video visit, however this provider unable to connect despite patient being present on video.   I discussed the limitations, risks, security and privacy concerns of performing an evaluation and management service by telephone and the availability of in person appointments. I also discussed with the patient that there may be a patient responsible charge related to this service. The patient expressed understanding and agreed to proceed.  Location patient: home Location provider: work or home office Participants present for the call: patient, provider Patient did not have a visit in the prior 7 days to address this/these issue(s).   History of Present Illness: Pt is a 24 yo female with pmh sig for seasonal allergies, exercise-induced asthma, anxiety, depression seen for follow-up.  Pt notes insomnia.  Up at 3, 6, 10 am.  This has affected her energy.  Gets up to walk her dog then may get back in bed.  Pt notes decreased appetite.  Does not think the increased dose of Paxil is helping much.    Pt will be leaving in mid Aug to play basketball in [place].  She is hoping to take her dog "Scout" with her to help with her anxiety.  Pt plans on having him trained as an psychological support animal.  Pt hoping her dog will be able to wake her up/remind her to take medications.  Pt states she may need an note for the airline.   Observations/Objective: Patient sounds cheerful and well on the phone. I do not appreciate any SOB. Speech and thought processing are grossly intact. Patient reported vitals:  Assessment and Plan: Given continued anxiety and depression will change medications.  Discussed weaning off Paxil 30 mg.  Pt to start taking Paxil 20 mg x the next few wks.  Will then take  Paxil 10 mg x 1 wk before starting Zoloft 25 mg daily.   Will take Zoloft 25 mg daily x 1 week, then increase to 50 mg daily.   If needed will adjust taper.  Will have patient follow-up in clinic in the next few weeks.  Given precautions.  Pt will provide any forms needed for completion for her dog to travel with her.  Anxiety  Depression, recurrent (HCC)    Follow Up Instructions:  Follow-up in the next 2 weeks  I did not refer this patient for an OV in the next 24 hours for this/these issue(s).  I discussed the assessment and treatment plan with the patient. The patient was provided an opportunity to ask questions and all were answered. The patient agreed with the plan and demonstrated an understanding of the instructions.   The patient was advised to call back or seek an in-person evaluation if the symptoms worsen or if the condition fails to improve as anticipated.  I provided 14 minutes of non-face-to-face time during this encounter.   Deeann Saint, MD

## 2020-04-22 ENCOUNTER — Telehealth: Payer: Self-pay | Admitting: Family Medicine

## 2020-04-22 NOTE — Telephone Encounter (Signed)
Form have been received and given to Dr Salomon Fick for completing

## 2020-04-22 NOTE — Telephone Encounter (Signed)
[employer] form to be filled out.  Given to [place] to be filled out-- patient's flight is leaving on Thursday.  Call [phone] Upon completion.

## 2020-04-23 NOTE — Telephone Encounter (Signed)
Pt form is complete and pt has been notified to pick up form in the office. Form has been placed in the cabinet front office, copy made and sent to scanning

## 2020-05-01 ENCOUNTER — Ambulatory Visit: Payer: BC Managed Care – PPO | Admitting: Family Medicine

## 2020-09-02 ENCOUNTER — Ambulatory Visit (INDEPENDENT_AMBULATORY_CARE_PROVIDER_SITE_OTHER): Payer: BC Managed Care – PPO

## 2020-09-02 ENCOUNTER — Other Ambulatory Visit: Payer: Self-pay

## 2020-09-02 ENCOUNTER — Ambulatory Visit: Payer: BC Managed Care – PPO | Admitting: Podiatry

## 2020-09-02 DIAGNOSIS — S93401A Sprain of unspecified ligament of right ankle, initial encounter: Secondary | ICD-10-CM

## 2020-09-02 DIAGNOSIS — S99911A Unspecified injury of right ankle, initial encounter: Secondary | ICD-10-CM

## 2020-09-02 NOTE — Progress Notes (Signed)
   Subjective:  24 y.o. female presenting today as a new patient for evaluation of an injury that was sustained approximately 3 months ago while playing basketball.  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  Patient sustained the injury in the middle of the game.  She was diagnosed with a mild ankle sprain.  She has had pain and tenderness ever since.  She was referred here from local podiatrist, Dr. Regan Lemming, for further treatment and evaluation.   Past Medical History:  Diagnosis Date  . Allergy   . Asthma      Objective / Physical Exam:  General:  The patient is alert and oriented x3 in no acute distress. Dermatology:  Skin is warm, dry and supple bilateral lower extremities. Negative for open lesions or macerations. Vascular:  Palpable pedal pulses bilaterally. No edema or erythema noted. Capillary refill within normal limits. Neurological:  Epicritic and protective threshold grossly intact bilaterally.  Musculoskeletal Exam:  Pain on palpation to the anterior lateral medial aspects of the patient's right ankle. Mild edema noted. Range of motion within normal limits to all pedal and ankle joints bilateral. Muscle strength 5/5 in all groups bilateral.   Radiographic Exam:  Normal osseous mineralization. Joint spaces preserved. No fracture/dislocation/boney destruction.  There is a well-circumscribed osseous fragment to the anterior aspect of the ankle joint on lateral view.  This appears to be chronic and stable.  Assessment: 1.  Ankle sprain right; chronic  Plan of Care:  1. Patient was evaluated. X-Rays reviewed.  2.  Explained to the patient that prior to any additional treatment modalities it would be wise to get an MRI of the right ankle. 3.  Order placed for MRI right ankle 4.  In the meantime continue conservative treatment including rest, ice, elevation, and compression 5.  Return to clinic after MRI to review the results and discuss  further treatment options  *Pl[facility] in the [facility].  Also played [place].  Knows Coach Sam.  Going to a 6 week [employer] in [place] 10/02/2020.   Felecia Shelling, DPM [facility]  Dr. Felecia Shelling, DPM    [address]                                        [place], [place] [address]                Office [phone]  Fax [phone]

## 2020-09-26 ENCOUNTER — Other Ambulatory Visit: Payer: Self-pay

## 2020-09-26 ENCOUNTER — Other Ambulatory Visit: Payer: BC Managed Care – PPO

## 2020-09-26 ENCOUNTER — Ambulatory Visit
Admission: RE | Admit: 2020-09-26 | Discharge: 2020-09-26 | Disposition: A | Discharge status: Home / Self Care | Payer: BC Managed Care – PPO | Source: Ambulatory Visit | Attending: Podiatry | Admitting: Podiatry

## 2020-09-26 DIAGNOSIS — S99911A Unspecified injury of right ankle, initial encounter: Secondary | ICD-10-CM

## 2020-09-26 DIAGNOSIS — S93401A Sprain of unspecified ligament of right ankle, initial encounter: Secondary | ICD-10-CM

## 2020-09-26 DIAGNOSIS — Z20822 Contact with and (suspected) exposure to covid-19: Secondary | ICD-10-CM

## 2020-09-28 LAB — NOVEL CORONAVIRUS, NAA: SARS-CoV-2, NAA: NOT DETECTED

## 2020-09-28 LAB — SARS-COV-2, NAA 2 DAY TAT

## 2021-04-26 ENCOUNTER — Encounter: Payer: Self-pay | Admitting: Family Medicine

## 2021-05-14 ENCOUNTER — Other Ambulatory Visit: Payer: Self-pay

## 2021-05-15 ENCOUNTER — Ambulatory Visit (INDEPENDENT_AMBULATORY_CARE_PROVIDER_SITE_OTHER): Payer: Self-pay | Admitting: Family Medicine

## 2021-05-15 ENCOUNTER — Encounter: Payer: Self-pay | Admitting: Family Medicine

## 2021-05-15 VITALS — BP 118/80 | HR 91 | Temp 98.3°F | Wt 170.0 lb

## 2021-05-15 DIAGNOSIS — N9089 Other specified noninflammatory disorders of vulva and perineum: Secondary | ICD-10-CM

## 2021-05-15 DIAGNOSIS — F419 Anxiety disorder, unspecified: Secondary | ICD-10-CM

## 2021-05-15 MED ORDER — BUSPIRONE HCL 7.5 MG PO TABS: 7.5000 mg | ORAL_TABLET | Freq: Two times a day (BID) | ORAL | 3 refills | Status: DC

## 2021-05-15 MED ORDER — KETOCONAZOLE 2 % EX CREA: 1.0000 "application " | TOPICAL_CREAM | Freq: Every day | CUTANEOUS | 2 refills | Status: DC

## 2021-05-15 NOTE — Progress Notes (Signed)
Subjective:    Patient ID: Donna Montgomery, female    DOB: 1995/10/10, 25 y.o.   MRN: [id number]  Chief Complaint  Patient presents with   Medication Refill    Refill or change    HPI Patient was seen today for follow-up.  Patient endorses continued anxiety more than depression.  Patient concerned about performance anxiety as leading to play basketball in [place] for 2 months.  Patient stopped taking Zoloft a few months ago.  Patient previously tried Paxil without relief. Pt inquires about dryness of the labia majora and occasional pruritus.  Followed by OB/GYN.  No skin changes noted during recent visit.  Using Dr. Hali Marry body wash.  Past Medical History:  Diagnosis Date   Allergy    Asthma     Allergies  Allergen Reactions   Pollen Extract Cough and Other (See Comments)   Other     Allergic to cats    ROS General: Denies fever, chills, night sweats, changes in weight, changes in appetite HEENT: Denies headaches, ear pain, changes in vision, rhinorrhea, sore throat CV: Denies CP, palpitations, SOB, orthopnea Pulm: Denies SOB, cough, wheezing GI: Denies abdominal pain, nausea, vomiting, diarrhea, constipation GU: Denies dysuria, hematuria, frequency, vaginal discharge  + labial dryness/pruritus Msk: Denies muscle cramps, joint pains Neuro: Denies weakness, numbness, tingling Skin: Denies rashes, bruising Psych: Denies hallucinations+ anxiety and depression    Objective:    Blood pressure 118/80, pulse 91, temperature 98.3 F (36.8 C), temperature source Oral, weight 170 lb (77.1 kg), SpO2 98 %.  Gen. Pleasant, well-nourished, in no distress, normal affect  HEENT: [place]/AT, face symmetric, conjunctiva clear, no scleral icterus, PERRLA, EOMI, nares patent without drainage Lungs: no accessory muscle use Cardiovascular: RRR, no peripheral edema Musculoskeletal: No deformities, no cyanosis or clubbing, normal tone Neuro:  A&Ox3, CN II-XII intact, normal gait Skin:  Warm,  no lesions/ rash  Wt Readings from Last 3 Encounters:  05/15/21 170 lb (77.1 kg)  03/25/20 175 lb (79.4 kg)  01/29/20 172 lb (78 kg)    Lab Results  Component Value Date   WBC 4.8 12/25/2019   HGB 11.9 (L) 12/25/2019   HCT 35.5 (L) 12/25/2019   PLT 302.0 12/25/2019   TSH 1.99 12/25/2019   GAD 7 : Generalized Anxiety Score 05/15/2021 01/29/2020 12/25/2019 05/03/2019  Nervous, Anxious, on Edge 3 0 3 3  Control/stop worrying 3 0 3 3  Worry too much - different things 3 1 3 3   Trouble relaxing 3 0 3 3  Restless 0 0 0 2  Easily annoyed or irritable 3 0 2 3  Afraid - awful might happen 2 0 3 3  Total GAD 7 Score 17 1 17 20   Anxiety Difficulty Extremely difficult - Very difficult Extremely difficult    Depression screen [facility] 2/9 05/15/2021 01/29/2020 12/25/2019  Decreased Interest 3 0 3  Down, Depressed, Hopeless 1 0 1  PHQ - 2 Score 4 0 4  Altered sleeping 1 1 3   Tired, decreased energy 3 0 3  Change in appetite 3 0 2  Feeling bad or failure about yourself  3 1 3   Trouble concentrating 0 0 3  Moving slowly or fidgety/restless 0 0 0  Suicidal thoughts 0 0 0  PHQ-9 Score 14 2 18   Difficult doing work/chores - - Extremely dIfficult     Assessment/Plan:  Anxiety  -GAD 7 score 17 -d/c zoloft as no longer taking -Counseling -Self-care -Start BuSpar twice daily - Plan: busPIRone (BUSPAR) 7.5  MG tablet  Labial irritation  -Consider moisture wicking underwear.  Avoid harsh overly drying products. - Plan: ketoconazole (NIZORAL) 2 % cream  F/u in 4-6 wks, sooner if needed  Abbe Amsterdam, MD

## 2021-06-18 IMAGING — MR MR ANKLE*R* W/O CM
6 series · 40 of 40 positions shown · non-contrast
Comparison: X-ray 09/02/2020

CLINICAL DATA: Right ankle pain after injury playing basketball
approximately 3 months ago

EXAM:
MRI OF THE RIGHT ANKLE WITHOUT CONTRAST
TECHNIQUE: Multiplanar, multisequence MR imaging of the ankle was performed. No
intravenous contrast was administered.

[Series 5: PD fat-sat · axial · 3.0mm · 0.42mm/px · z∈[-103,+18]mm · 8 of 32 slices shown]
[im 1/32]
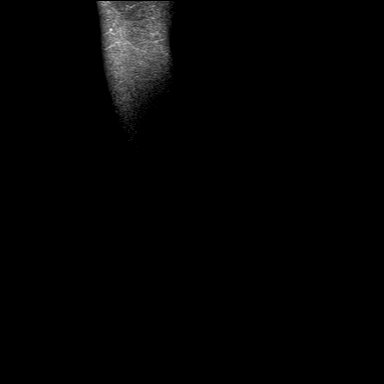
[im 5/32]
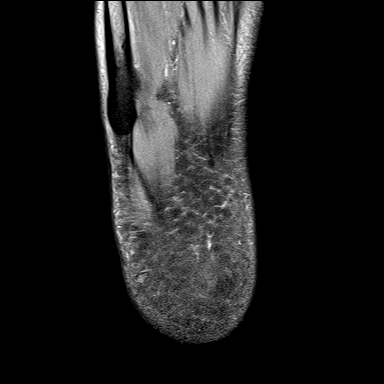
[im 9/32]
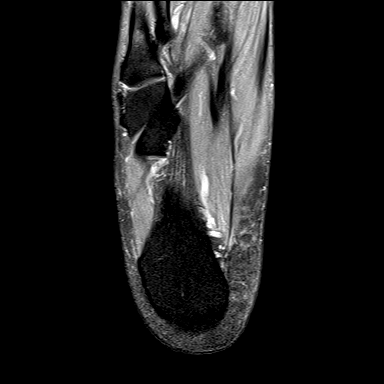
[im 14/32]
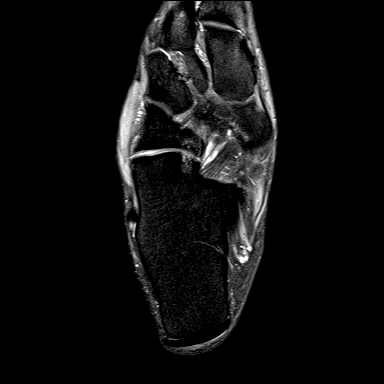
[im 18/32]
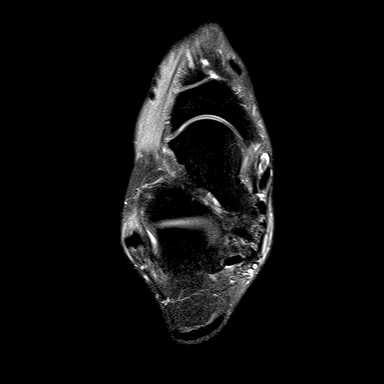
[im 23/32]
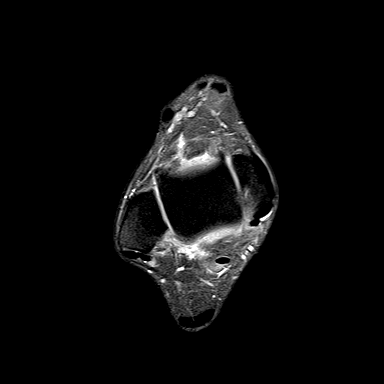
[im 27/32]
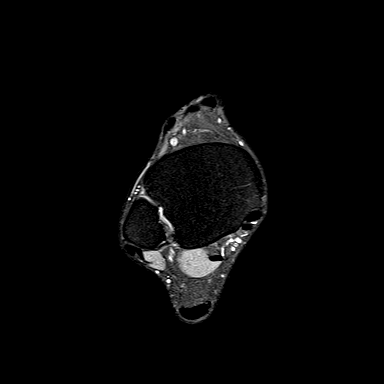
[im 32/32]
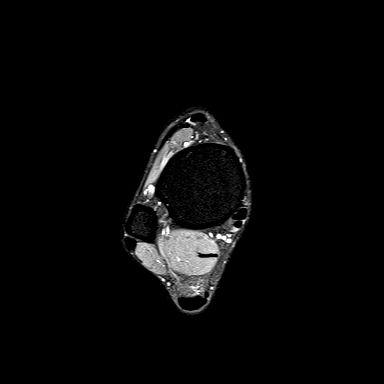

[Series 6: T1 · axial · 3.0mm · 0.50mm/px · z∈[-103,+29]mm · 8 of 35 slices shown (1 of 2)]
[im 1/35]
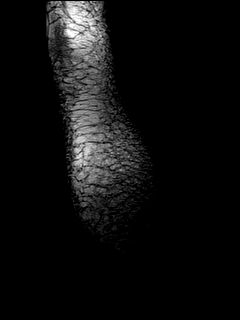
[im 5/35]
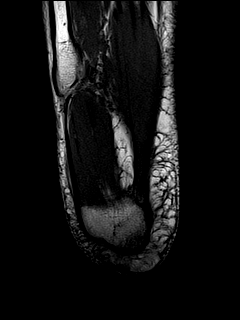
[im 10/35]
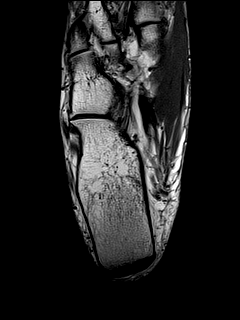
[im 15/35]
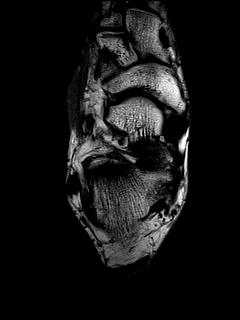
[im 20/35]
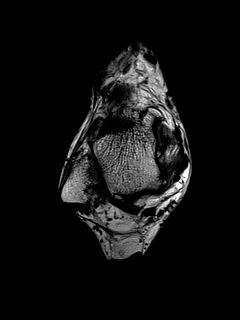
[im 25/35]
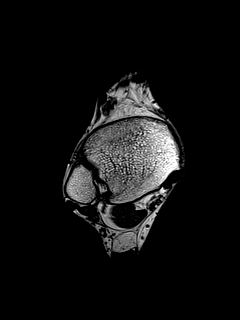
[im 30/35]
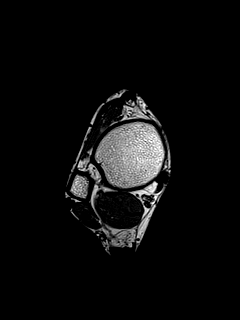
[im 35/35]
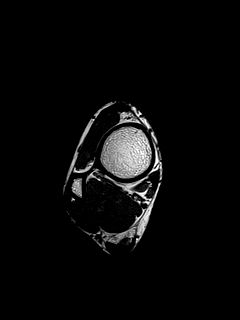

[Series 7: T1 · sagittal · 4.0mm · 0.56mm/px · 4 of 18 slices shown (2 of 2)]
[im 1/18]
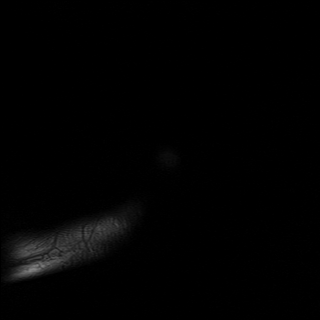
[im 6/18]
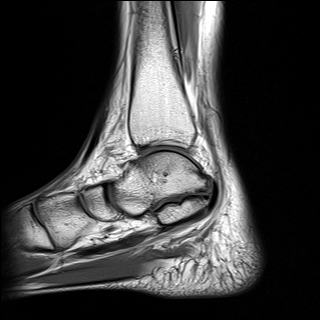
[im 12/18]
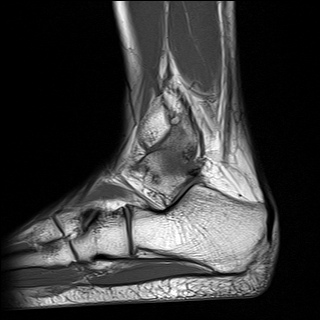
[im 18/18]
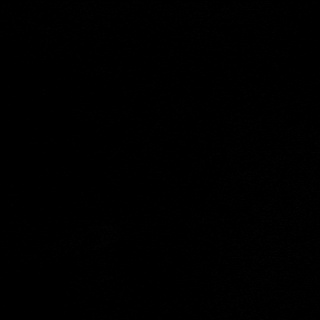

[Series 8: STIR · sagittal · 4.0mm · 0.35mm/px · 4 of 18 slices shown]
[im 1/18]
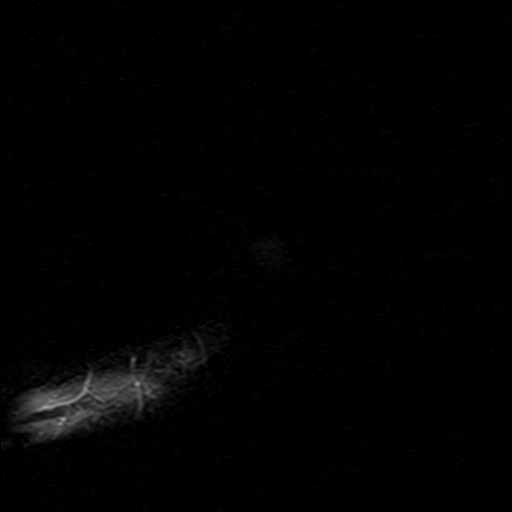
[im 6/18]
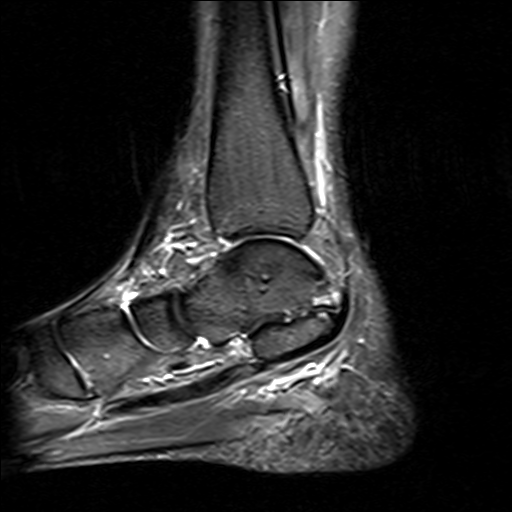
[im 12/18]
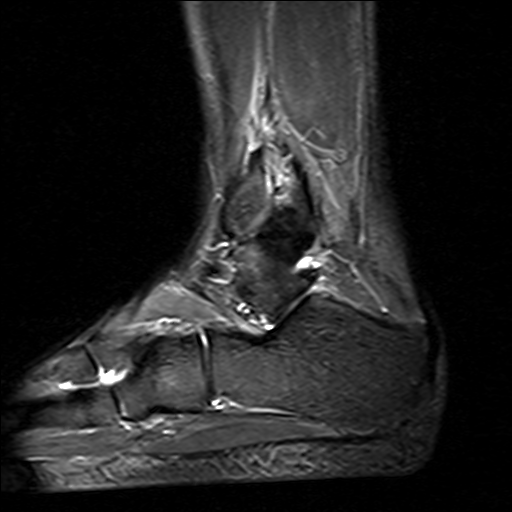
[im 18/18]
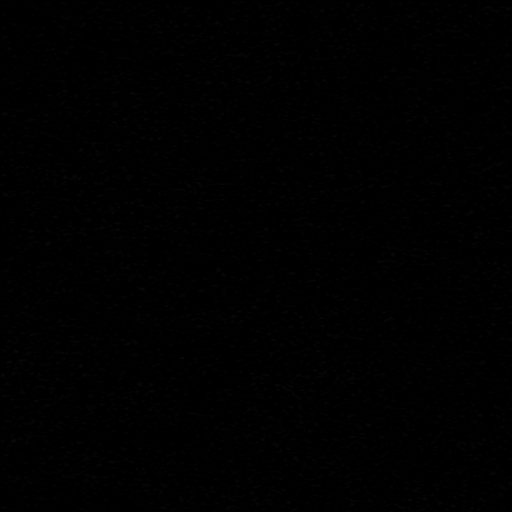

[Series 9: T2 fat-sat · coronal · 3.0mm · 0.50mm/px · 8 of 35 slices shown]
[im 1/35]
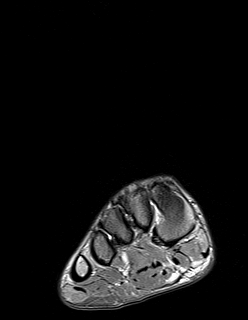
[im 5/35]
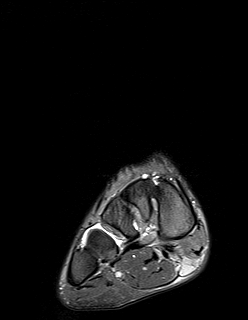
[im 10/35]
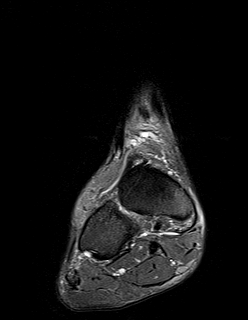
[im 15/35]
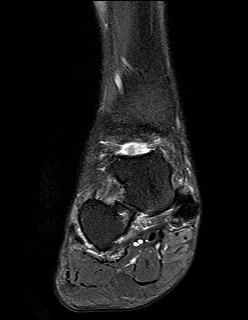
[im 20/35]
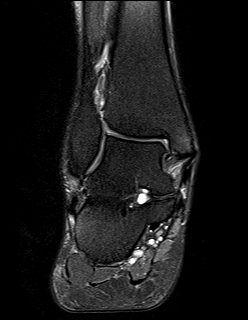
[im 25/35]
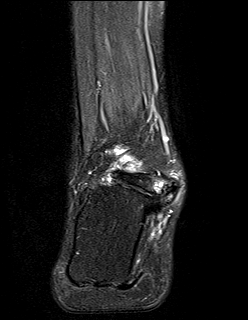
[im 30/35]
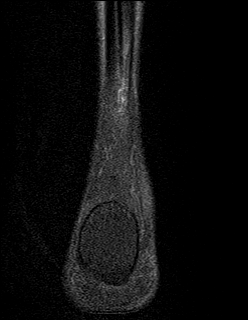
[im 35/35]
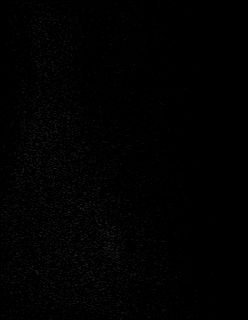

[Series 10: T1 fat-sat · axial · 3.0mm · 0.62mm/px · z∈[-103,+17]mm · 8 of 32 slices shown]
[im 1/32]
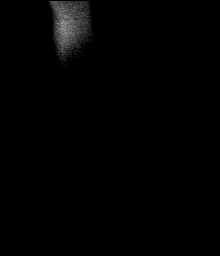
[im 5/32]
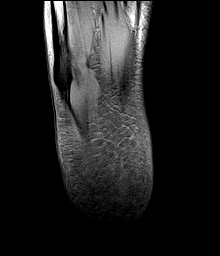
[im 9/32]
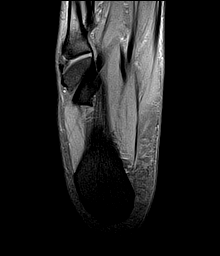
[im 14/32]
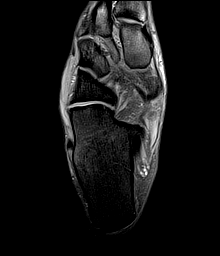
[im 18/32]
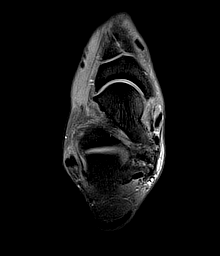
[im 23/32]
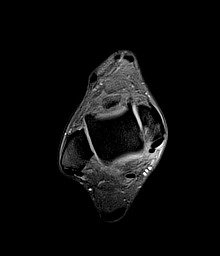
[im 27/32]
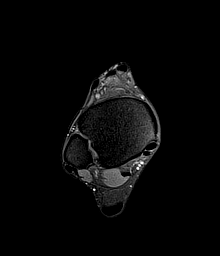
[im 32/32]
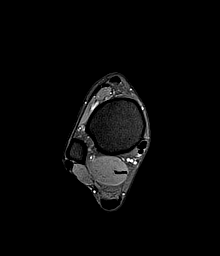

[40 of 40 positions shown; findings below may reference images not displayed]

FINDINGS: TENDONS

Peroneal: Intact peroneus longus and peroneus brevis tendons.

Posteromedial: Intact tibialis posterior, flexor hallucis longus and
flexor digitorum longus tendons.

Anterior: Intact tibialis anterior, extensor hallucis longus and
extensor digitorum longus tendons.

Achilles: Intact.

Plantar Fascia: Intact.

LIGAMENTS

Lateral: Heterogeneous appearance of the anterior talofibular
ligament suggesting sequela of prior trauma. No acute ligamentous
injury. The posterior talofibular and calcaneofibular ligaments are
intact. Anterior and posterior tibiofibular ligaments intact.

Medial: Deltoid ligament and spring ligament complex intact.

CARTILAGE

Ankle Joint: No cartilage defect. 5 x 3 mm ossified loose body at
the anterior aspect of the tibiotalar joint. Trace joint fluid
without significant effusion.

Subtalar Joints/Sinus Tarsi: Severe subtalar arthropathy at the far
medial aspect of the subtalar joint at the level of the
sustentaculum tali with complete joint space loss and prominent
subchondral cystic changes on both sides of the joint (series 9,
image 12). No subtalar joint effusion. Small os trigonum.
Preservation of the anatomic fat within the sinus tarsi.

Bones: No acute fracture. No dislocation. No bone marrow edema. No
suspicious bone lesion.

Other: No soft tissue edema or fluid collection.
IMPRESSION: 1. Severe subtalar arthropathy at the far medial aspect of the
subtalar joint at the level of the sustentaculum tali.
2. 5 x 3 mm ossified loose body at the anterior aspect of the
tibiotalar joint.
3. Findings suggestive of remote ATFL injury.

## 2021-10-08 ENCOUNTER — Other Ambulatory Visit: Payer: Self-pay | Admitting: Family Medicine

## 2021-10-08 DIAGNOSIS — F419 Anxiety disorder, unspecified: Secondary | ICD-10-CM

## 2022-09-24 ENCOUNTER — Encounter: Payer: Self-pay | Admitting: Family Medicine

## 2022-09-24 ENCOUNTER — Ambulatory Visit (INDEPENDENT_AMBULATORY_CARE_PROVIDER_SITE_OTHER): Payer: Medicaid Other | Admitting: Family Medicine

## 2022-09-24 VITALS — BP 114/76 | HR 73 | Temp 98.8°F | Ht 75.5 in | Wt 163.0 lb

## 2022-09-24 DIAGNOSIS — Z0000 Encounter for general adult medical examination without abnormal findings: Secondary | ICD-10-CM | POA: Diagnosis not present

## 2022-09-24 DIAGNOSIS — Z13220 Encounter for screening for lipoid disorders: Secondary | ICD-10-CM | POA: Diagnosis not present

## 2022-09-24 LAB — CBC WITH DIFFERENTIAL/PLATELET
Basophils Absolute: 0.1 10*3/uL (ref 0.0–0.1)
Basophils Relative: 1.2 % (ref 0.0–3.0)
Eosinophils Absolute: 0.3 10*3/uL (ref 0.0–0.7)
Eosinophils Relative: 7 % — ABNORMAL HIGH (ref 0.0–5.0)
HCT: 32 % — ABNORMAL LOW (ref 36.0–46.0)
Hemoglobin: 10.6 g/dL — ABNORMAL LOW (ref 12.0–15.0)
Lymphocytes Relative: 42.5 % (ref 12.0–46.0)
Lymphs Abs: 1.9 10*3/uL (ref 0.7–4.0)
MCHC: 33.2 g/dL (ref 30.0–36.0)
MCV: 85.6 fl (ref 78.0–100.0)
Monocytes Absolute: 0.4 10*3/uL (ref 0.1–1.0)
Monocytes Relative: 9.8 % (ref 3.0–12.0)
Neutro Abs: 1.7 10*3/uL (ref 1.4–7.7)
Neutrophils Relative %: 39.5 % — ABNORMAL LOW (ref 43.0–77.0)
Platelets: 362 10*3/uL (ref 150.0–400.0)
RBC: 3.74 Mil/uL — ABNORMAL LOW (ref 3.87–5.11)
RDW: 14.5 % (ref 11.5–15.5)
WBC: 4.4 10*3/uL (ref 4.0–10.5)

## 2022-09-24 LAB — LIPID PANEL
Cholesterol: 136 mg/dL (ref 0–200)
HDL: 60.3 mg/dL (ref >39.00)
LDL Cholesterol: 67 mg/dL (ref 0–99)
NonHDL: 75.27
Total CHOL/HDL Ratio: 2
Triglycerides: 40 mg/dL (ref 0.0–149.0)
VLDL: 8 mg/dL (ref 0.0–40.0)

## 2022-09-24 LAB — COMPREHENSIVE METABOLIC PANEL
ALT: 19 U/L (ref 0–35)
AST: 25 U/L (ref 0–37)
Albumin: 4.2 g/dL (ref 3.5–5.2)
Alkaline Phosphatase: 62 U/L (ref 39–117)
BUN: 15 mg/dL (ref 6–23)
CO2: 29 mEq/L (ref 19–32)
Calcium: 9.4 mg/dL (ref 8.4–10.5)
Chloride: 107 mEq/L (ref 96–112)
Creatinine, Ser: 0.8 mg/dL (ref 0.40–1.20)
GFR: 100.58 mL/min (ref >60.00)
Glucose, Bld: 83 mg/dL (ref 70–99)
Potassium: 3.7 mEq/L (ref 3.5–5.1)
Sodium: 142 mEq/L (ref 135–145)
Total Bilirubin: 0.9 mg/dL (ref 0.2–1.2)
Total Protein: 7 g/dL (ref 6.0–8.3)

## 2022-09-24 LAB — HEMOGLOBIN A1C: Hgb A1c MFr Bld: 5.2 % (ref 4.6–6.5)

## 2022-09-24 NOTE — Progress Notes (Signed)
Subjective:     Donna Montgomery is a 27 y.o. female and is here for a comprehensive physical exam. The patient reports doing well.  Not having any anxiety.  Pt will be playing basketball in [place] next month.    Social History   Socioeconomic History   Marital status: Single    Spouse name: Not on file   Number of children: Not on file   Years of education: Not on file   Highest education level: Not on file  Occupational History   Not on file  Tobacco Use   Smoking status: Never   Smokeless tobacco: Never  Vaping Use   Vaping Use: Never used  Substance and Sexual Activity   Alcohol use: Yes    Comment: 1-2 glasses of wine per month   Drug use: No   Sexual activity: Never  Other Topics Concern   Not on file  Social History Narrative   Not on file   Social Determinants of Health   Financial Resource Strain: Not on file  Food Insecurity: Not on file  Transportation Needs: Not on file  Physical Activity: Not on file  Stress: Not on file  Social Connections: Not on file  Intimate Partner Violence: Not on file   Health Maintenance  Topic Date Due   HIV Screening  Never done   Hepatitis C Screening  Never done   PAP-Cervical Cytology Screening  Never done   PAP SMEAR-Modifier  05/16/2019   COVID-19 Vaccine (4 - 2023-24 season) 05/15/2022   DTaP/Tdap/Td (2 - Td or Tdap) 06/30/2022   INFLUENZA VACCINE  12/13/2022 (Originally 04/14/2022)   HPV VACCINES  Aged Out    The following portions of the patient's history were reviewed and updated as appropriate: allergies, current medications, past family history, past medical history, past social history, past surgical history, and problem list.  Review of Systems Pertinent items noted in HPI and remainder of comprehensive ROS otherwise negative.   Objective:    BP 114/76 (BP Location: Right Arm, Patient Position: Sitting, Cuff Size: Normal)   Pulse 73   Temp 98.8 F (37.1 C) (Oral)   Ht 6' 3.5" (1.918 m)   Wt 163 lb (73.9  kg)   LMP 09/20/2022 (Exact Date)   SpO2 99%   BMI 20.10 kg/m  General appearance: alert, cooperative, and no distress Head: Normocephalic, without obvious abnormality, atraumatic Eyes: conjunctivae/corneas clear. PERRL, EOM's intact. Fundi benign. Ears: normal TM's and external ear canals both ears Nose: Nares normal. Septum midline. Mucosa normal. No drainage or sinus tenderness. Throat: lips, mucosa, and tongue normal; teeth and gums normal Neck: no adenopathy, no carotid bruit, no JVD, supple, symmetrical, trachea midline, and thyroid not enlarged, symmetric, no tenderness/mass/nodules Lungs: clear to auscultation bilaterally Heart: regular rate and rhythm, S1, S2 normal, no murmur, click, rub or gallop Abdomen: soft, non-tender; bowel sounds normal; no masses,  no organomegaly Extremities: extremities normal, atraumatic, no cyanosis or edema Pulses: 2+ and symmetric Skin: Skin color, texture, turgor normal. No rashes or lesions Lymph nodes: Cervical, supraclavicular, and axillary nodes normal. Neurologic: Alert and oriented X 3, normal strength and tone. Normal symmetric reflexes. Normal coordination and gait    Assessment:    Healthy female exam.      Plan:    Anticipatory guidance given including wearing seatbelts, smoke detectors in the home, increasing physical activity, increasing p.o. intake of water and vegetables. -labs -pap with OB/Gyn in Feb. 2024 -immunizations reviewed.  Declines influenza vaccine at this time. -  next CPE in 1 yr See After Visit Summary for Counseling Recommendations   - Plan: CMP, CBC with Differential/Platelet, Lipid panel, Hemoglobin A1c  Screening for cholesterol level - Plan: Lipid panel  F/u prn  Grier Mitts, MD

## 2022-10-19 ENCOUNTER — Other Ambulatory Visit (HOSPITAL_COMMUNITY)
Admission: RE | Admit: 2022-10-19 | Discharge: 2022-10-19 | Disposition: A | Discharge status: Home / Self Care | Payer: Medicaid Other | Source: Ambulatory Visit | Attending: Obstetrics & Gynecology | Admitting: Obstetrics & Gynecology

## 2022-10-19 ENCOUNTER — Ambulatory Visit: Payer: Medicaid Other | Admitting: Obstetrics & Gynecology

## 2022-10-19 ENCOUNTER — Encounter: Payer: Self-pay | Admitting: Obstetrics & Gynecology

## 2022-10-19 VITALS — BP 121/76 | HR 72 | Ht 77.0 in | Wt 163.0 lb

## 2022-10-19 DIAGNOSIS — N920 Excessive and frequent menstruation with regular cycle: Secondary | ICD-10-CM

## 2022-10-19 DIAGNOSIS — Z01419 Encounter for gynecological examination (general) (routine) without abnormal findings: Secondary | ICD-10-CM | POA: Insufficient documentation

## 2022-10-19 DIAGNOSIS — D500 Iron deficiency anemia secondary to blood loss (chronic): Secondary | ICD-10-CM | POA: Diagnosis not present

## 2022-10-19 DIAGNOSIS — Z30015 Encounter for initial prescription of vaginal ring hormonal contraceptive: Secondary | ICD-10-CM | POA: Diagnosis not present

## 2022-10-19 MED ORDER — ETONOGESTREL-ETHINYL ESTRADIOL 0.12-0.015 MG/24HR VA RING: VAGINAL_RING | VAGINAL | 12 refills | Status: DC

## 2022-10-19 NOTE — Progress Notes (Signed)
Subjective:     Donna Montgomery is a 27 y.o. female here for a routine exam.  Current complaints: heavy menstrual cycles causing iron deficiency anemia.  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     Gynecologic History Patient's last menstrual period was 09/20/2022 (exact date). Contraception: abstinence Last Pap: several years ago. Results were: normal Last mammogram: n/a  Obstetric History OB History  Gravida Para Term Preterm AB Living  0 0 0 0 0 0  SAB IAB Ectopic Multiple Live Births  0 0 0 0 0     The following portions of the patient's history were reviewed and updated as appropriate: allergies, current medications, past family history, past medical history, past social history, past surgical history, and problem list.  Review of Systems Pertinent items noted in HPI and remainder of comprehensive ROS otherwise negative.    Objective:     Vitals:   10/19/22 1358  BP: 121/76  Pulse: 72  Weight: 163 lb (73.9 kg)  Height: 6' 5"$  (1.956 m)   Vitals:  WNL General appearance: alert, cooperative and no distress  HEENT: Normocephalic, without obvious abnormality, atraumatic Eyes: negative Throat: lips, mucosa, and tongue normal; teeth and gums normal  Respiratory: Clear to auscultation bilaterally  CV: Regular rate and rhythm  Breasts:  Normal appearance, no masses or tenderness, no nipple retraction or dimpling  GI: Soft, non-tender; bowel sounds normal; no masses,  no organomegaly  GU: External Genitalia:  Tanner V, no lesion Urethra:  No prolapse   Vagina: Pink, normal rugae, no blood or discharge  Cervix: No CMT, no lesion  Uterus:  Normal size and contour, non tender, retroverted  Adnexa: Normal, no masses, non tender  Musculoskeletal: No edema, redness or tenderness in the calves or thighs  Skin: No lesions or rash  Lymphatic: Axillary adenopathy: none     Psychiatric: Normal mood and behavior        Assessment:     Healthy female exam.    Plan:    Pap with reflex testing Menorrhagia with regular cycle--discussed forms of birt control and decided on the Nuva ring for cycle control--discussed continuous ring (change every 3 weeks and not have cycle).  She is considering.  Will also get a pelvic US complete with TVUS to look for structural abnormalities causing heavy menstrual cycles.

## 2022-10-20 ENCOUNTER — Ambulatory Visit (INDEPENDENT_AMBULATORY_CARE_PROVIDER_SITE_OTHER): Payer: Medicaid Other

## 2022-10-20 DIAGNOSIS — N920 Excessive and frequent menstruation with regular cycle: Secondary | ICD-10-CM

## 2022-10-21 LAB — CYTOLOGY - PAP: Diagnosis: NEGATIVE

## 2022-10-24 ENCOUNTER — Encounter: Payer: Self-pay | Admitting: Obstetrics & Gynecology

## 2022-10-24 DIAGNOSIS — D509 Iron deficiency anemia, unspecified: Secondary | ICD-10-CM | POA: Insufficient documentation

## 2022-10-24 DIAGNOSIS — N920 Excessive and frequent menstruation with regular cycle: Secondary | ICD-10-CM | POA: Insufficient documentation

## 2023-01-02 DIAGNOSIS — Y9367 Activity, basketball: Secondary | ICD-10-CM | POA: Diagnosis not present

## 2023-01-02 DIAGNOSIS — S199XXA Unspecified injury of neck, initial encounter: Secondary | ICD-10-CM | POA: Diagnosis not present

## 2023-01-02 DIAGNOSIS — S29012A Strain of muscle and tendon of back wall of thorax, initial encounter: Secondary | ICD-10-CM | POA: Diagnosis not present

## 2023-02-03 ENCOUNTER — Ambulatory Visit (INDEPENDENT_AMBULATORY_CARE_PROVIDER_SITE_OTHER): Payer: Medicaid Other

## 2023-02-03 ENCOUNTER — Ambulatory Visit
Admission: EM | Admit: 2023-02-03 | Discharge: 2023-02-03 | Disposition: A | Discharge status: Home / Self Care | Payer: Medicaid Other | Attending: Family Medicine | Admitting: Family Medicine

## 2023-02-03 DIAGNOSIS — S63501A Unspecified sprain of right wrist, initial encounter: Secondary | ICD-10-CM | POA: Diagnosis not present

## 2023-02-03 DIAGNOSIS — S6991XA Unspecified injury of right wrist, hand and finger(s), initial encounter: Secondary | ICD-10-CM | POA: Diagnosis not present

## 2023-02-03 MED ORDER — CELECOXIB 100 MG PO CAPS: 100.0000 mg | ORAL_CAPSULE | Freq: Two times a day (BID) | ORAL | 0 refills | Status: AC

## 2023-02-03 NOTE — Discharge Instructions (Addendum)
Instructed patient to take medication as directed with food to completion.  Encouraged increase daily water intake to 64 ounces per day while taking this medication.  Advised patient to RICE affected area of right wrist for 30 minutes 3 times daily for the next 3 days.  Advised patient if symptoms worsen and/or unresolved please follow-up with PCP or [place] orthopedic provider for further evaluation.  Contact information for family practice and orthopedics is on this AVS today.

## 2023-02-03 NOTE — ED Triage Notes (Signed)
Pt c/o RT wrist injury since 7pm last night. Happened at work, however pt has medicaid and will not be filing W/C. Pain 3/10 Ibuprofen and ice prn.

## 2023-02-03 NOTE — ED Provider Notes (Signed)
Donna Montgomery C[facility]    CSN: [id number] Arrival date & time: 02/03/23  1039      History   Chief Complaint Chief Complaint  Patient presents with   Wrist Pain    RT    HPI Donna Montgomery is a 27 y.o. female.   HPI 27 year old female presents with right wrist pain since 7 PM last night.  Patient reports was at work when right wrist injury occurred.  Past Medical History:  Diagnosis Date   Allergy    Asthma     Patient Active Problem List   Diagnosis Date Noted   Menorrhagia with regular cycle 10/24/2022   Iron deficiency anemia 10/24/2022   Depression, recurrent (HCC) 03/25/2020   Anxiety 03/25/2020   Chronic toe pain, right foot 07/27/2017   Asthma 08/29/2010   EXERCISE INDUCED ASTHMA 07/11/2010    Past Surgical History:  Procedure Laterality Date   OPEN REDUCTION INTERNAL FIXATION (ORIF) HAND Left 2017    OB History     Gravida  0   Para  0   Term  0   Preterm  0   AB  0   Living  0      SAB  0   IAB  0   Ectopic  0   Multiple  0   Live Births  0            Home Medications    Prior to Admission medications   Medication Sig Start Date End Date Taking? Authorizing Provider  celecoxib (CELEBREX) 100 MG capsule Take 1 capsule (100 mg total) by mouth 2 (two) times daily for 15 days. 02/03/23 02/18/23 Yes Trevor Iha, FNP  etonogestrel-ethinyl estradiol (NUVARING) 0.12-0.015 MG/24HR vaginal ring Insert vaginally and leave in place for 3 consecutive weeks, then remove for 1 week. 10/19/22   Lesly Dukes, MD  Multiple Vitamin (MULTIVITAMIN WITH MINERALS) TABS tablet Take 1 tablet by mouth daily.    [provider]    Family History Family History  Problem Relation Age of Onset   Asthma Other     Social History Social History   Tobacco Use   Smoking status: Never   Smokeless tobacco: Never  Vaping Use   Vaping Use: Never used  Substance Use Topics   Alcohol use: Yes    Comment: 1-2 glasses of wine per month    Drug use: No     Allergies   Pollen extract and Other   Review of Systems Review of Systems   Physical Exam Triage Vital Signs ED Triage Vitals  Enc Vitals Group     BP 02/03/23 1053 129/75     Pulse Rate 02/03/23 1053 65     Resp 02/03/23 1053 18     Temp 02/03/23 1053 98.2 F (36.8 C)     Temp Source 02/03/23 1053 Oral     SpO2 02/03/23 1053 99 %     Weight --      Height --      Head Circumference --      Peak Flow --      Pain Score 02/03/23 1054 3     Pain Loc --      Pain Edu? --      Excl. in GC? --    No data found.  Updated Vital Signs BP 129/75 (BP Location: Left Arm)   Pulse 65   Temp 98.2 F (36.8 C) (Oral)   Resp 18   SpO2 99%  Physical Exam Vitals and nursing note reviewed.  Constitutional:      Appearance: Normal appearance. She is normal weight.  HENT:     Head: Normocephalic and atraumatic.     Mouth/Throat:     Mouth: Mucous membranes are moist.     Pharynx: Oropharynx is clear.  Eyes:     Extraocular Movements: Extraocular movements intact.     Conjunctiva/sclera: Conjunctivae normal.     Pupils: Pupils are equal, round, and reactive to light.  Cardiovascular:     Rate and Rhythm: Normal rate and regular rhythm.     Pulses: Normal pulses.     Heart sounds: Normal heart sounds.  Pulmonary:     Effort: Pulmonary effort is normal.     Breath sounds: Normal breath sounds. No wheezing or rhonchi.  Musculoskeletal:        General: Normal range of motion.     Cervical back: Normal range of motion and neck supple.     Comments: Right wrist (dorsum): Full range of motion intact, mild pain elicited with flexion and ulnar deviation, mild soft tissue swelling noted; grip is 4/5, neurovascular/neurosensory intact, brisk cap refill  Skin:    General: Skin is warm and dry.  Neurological:     General: No focal deficit present.     Mental Status: She is alert and oriented to person, place, and time.  Psychiatric:        Mood and Affect:  Mood normal.        Behavior: Behavior normal.      UC Treatments / Results  Labs (all labs ordered are listed, but only abnormal results are displayed) Labs Reviewed - No data to display  EKG   Radiology DG Wrist Complete Right  Result Date: 02/03/2023 CLINICAL DATA:  Injury EXAM: RIGHT WRIST - COMPLETE 3+ VIEW COMPARISON:  None Available. FINDINGS: There is no evidence of fracture or dislocation. There is no evidence of arthropathy or other focal bone abnormality. Soft tissues are unremarkable. IMPRESSION: Negative. Electronically Signed   By: Corlis Leak M.D.   On: 02/03/2023 11:23    Procedures Procedures (including critical care time)  Medications Ordered in UC Medications - No data to display  Initial Impression / Assessment and Plan / UC Course  I have reviewed the triage vital signs and the nursing notes.  Pertinent labs & imaging results that were available during my care of the patient were reviewed by me and considered in my medical decision making (see chart for details).     MDM: 1.  Sprain of right wrist, initial encounter-right wrist x-ray revealed above, Rx'd Celebrex 100 mg capsule twice daily x 15 days, advised patient to RICE affected area of right wrist for 30 minutes 3 times daily for the next 3 days. Instructed patient to take medication as directed with food to completion.  Encouraged increase daily water intake to 64 ounces per day while taking this medication.  Advised patient to RICE affected area of right wrist for 30 minutes 3 times daily for the next 3 days.  Advised patient if symptoms worsen and/or unresolved please follow-up with PCP or [place] orthopedic provider for further evaluation.  Contact information for family practice and orthopedics is on this AVS today.  Patient discharged home, hemodynamically stable. Final Clinical Impressions(s) / UC Diagnoses   Final diagnoses:  Sprain of right wrist, initial encounter     Discharge  Instructions      Instructed patient to take medication as directed  with food to completion.  Encouraged increase daily water intake to 64 ounces per day while taking this medication.  Advised patient to RICE affected area of right wrist for 30 minutes 3 times daily for the next 3 days.  Advised patient if symptoms worsen and/or unresolved please follow-up with PCP or [place] orthopedic provider for further evaluation.  Contact information for family practice and orthopedics is on this AVS today.     ED Prescriptions     Medication Sig Dispense Auth. Provider   celecoxib (CELEBREX) 100 MG capsule Take 1 capsule (100 mg total) by mouth 2 (two) times daily for 15 days. 30 capsule Trevor Iha, FNP      PDMP not reviewed this encounter.   Trevor Iha, FNP 02/03/23 1147

## 2023-05-24 ENCOUNTER — Other Ambulatory Visit: Payer: Self-pay | Admitting: Obstetrics & Gynecology

## 2023-07-15 ENCOUNTER — Other Ambulatory Visit: Payer: Self-pay | Admitting: *Deleted

## 2023-07-15 MED ORDER — ETONOGESTREL-ETHINYL ESTRADIOL 0.12-0.015 MG/24HR VA RING: VAGINAL_RING | VAGINAL | 3 refills | Status: DC

## 2023-07-29 ENCOUNTER — Ambulatory Visit (INDEPENDENT_AMBULATORY_CARE_PROVIDER_SITE_OTHER): Payer: Medicaid Other | Admitting: Podiatry

## 2023-07-29 ENCOUNTER — Encounter: Payer: Self-pay | Admitting: Podiatry

## 2023-07-29 ENCOUNTER — Ambulatory Visit: Payer: Medicaid Other

## 2023-07-29 ENCOUNTER — Other Ambulatory Visit: Payer: Self-pay

## 2023-07-29 DIAGNOSIS — M2012 Hallux valgus (acquired), left foot: Secondary | ICD-10-CM

## 2023-07-29 DIAGNOSIS — M7752 Other enthesopathy of left foot: Secondary | ICD-10-CM | POA: Diagnosis not present

## 2023-07-29 DIAGNOSIS — D492 Neoplasm of unspecified behavior of bone, soft tissue, and skin: Secondary | ICD-10-CM | POA: Diagnosis not present

## 2023-07-29 DIAGNOSIS — D2372 Other benign neoplasm of skin of left lower limb, including hip: Secondary | ICD-10-CM

## 2023-07-29 DIAGNOSIS — M79671 Pain in right foot: Secondary | ICD-10-CM

## 2023-07-29 DIAGNOSIS — M79672 Pain in left foot: Secondary | ICD-10-CM

## 2023-07-29 DIAGNOSIS — M2011 Hallux valgus (acquired), right foot: Secondary | ICD-10-CM

## 2023-07-29 DIAGNOSIS — M778 Other enthesopathies, not elsewhere classified: Secondary | ICD-10-CM | POA: Diagnosis not present

## 2023-07-29 NOTE — Progress Notes (Signed)
  Subjective:  Patient ID: Donna Montgomery, female    DOB: 11/30/1995,   MRN: [id number]  Chief Complaint  Patient presents with   Foot Pain    Pt presents today for a bump on the bottom of her foot and swelling on the her left pt stated that the swelling started about 2 months ago while the bump has been there for a while now.    27 y.o. female presents for concern as above. 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 Has tried many CMO. Relates they have been painful.  . Denies any other pedal complaints. Denies n/v/f/c.   Past Medical History:  Diagnosis Date   Allergy    Asthma     Objective:  Physical Exam: Vascular: DP/PT pulses 2/4 bilateral. CFT <3 seconds. Normal hair growth on digits. No edema.  Skin. No lacerations or abrasions bilateral feet. Hyperkeratotic cored lesion noted to plantar left heel with disruption of skin lines.  Musculoskeletal: MMT 5/5 bilateral lower extremities in DF, PF, Inversion and Eversion. Deceased ROM in DF of ankle joint. Tender to plantar first MPJ area on the right around the sesamoids complex. No pain with ROM of the first MPJ.  Neurological: Sensation intact to light touch.   Assessment:   1. Benign neoplasm of skin of left foot   2. Capsulitis of right foot      Plan:  Patient was evaluated and treated and all questions answered. -Discussed  benign skin lesions.  with patient and treatment options.  Discussed capsulitis and inflammation of joint and treatment options with patient.  Radiographs reviewed and discussed with patient. No acute fractures or dislocatiosn.  Discussed padding and dancers pads provided.  Anti-inflammatories as needed -Hyperkeratotic tissue was debrided with chisel without incident.  -Applied salycylic acid treatment to area with dressing. Advised to remove bandaging tomorrow.  -Encouraged daily moisturizing -Discussed use of pumice stone -Advised good supportive shoes and  inserts -Patient to return to office as needed or sooner if condition worsens.   Louann Sjogren, DPM

## 2023-08-05 ENCOUNTER — Encounter: Payer: Self-pay | Admitting: Family Medicine

## 2023-08-05 ENCOUNTER — Telehealth: Payer: Self-pay | Admitting: *Deleted

## 2023-08-05 ENCOUNTER — Ambulatory Visit: Payer: Medicaid Other | Admitting: Family Medicine

## 2023-08-05 ENCOUNTER — Ambulatory Visit: Payer: Medicaid Other

## 2023-08-05 VITALS — BP 130/85 | HR 71 | Ht 77.0 in | Wt 164.8 lb

## 2023-08-05 DIAGNOSIS — Z0000 Encounter for general adult medical examination without abnormal findings: Secondary | ICD-10-CM

## 2023-08-05 DIAGNOSIS — F321 Major depressive disorder, single episode, moderate: Secondary | ICD-10-CM | POA: Insufficient documentation

## 2023-08-05 DIAGNOSIS — H9312 Tinnitus, left ear: Secondary | ICD-10-CM | POA: Diagnosis not present

## 2023-08-05 DIAGNOSIS — F411 Generalized anxiety disorder: Secondary | ICD-10-CM | POA: Diagnosis not present

## 2023-08-05 DIAGNOSIS — H60502 Unspecified acute noninfective otitis externa, left ear: Secondary | ICD-10-CM | POA: Insufficient documentation

## 2023-08-05 DIAGNOSIS — M25511 Pain in right shoulder: Secondary | ICD-10-CM | POA: Diagnosis not present

## 2023-08-05 DIAGNOSIS — G8929 Other chronic pain: Secondary | ICD-10-CM | POA: Diagnosis not present

## 2023-08-05 MED ORDER — NEOMYCIN-POLYMYXIN-HC 1 % OT SOLN: 3.0000 [drp] | Freq: Three times a day (TID) | OTIC | 0 refills | Status: AC

## 2023-08-05 MED ORDER — FLUOXETINE HCL 10 MG PO TABS
10.0000 mg | ORAL_TABLET | Freq: Every day | ORAL | 3 refills | Status: DC
Start: 2023-08-05 — End: 2023-08-11

## 2023-08-05 NOTE — Assessment & Plan Note (Signed)
-   started prozac and patient is also open to therapy. Will go ahead and place orders

## 2023-08-05 NOTE — Assessment & Plan Note (Signed)
Erythema of external ear, in the setting of tinnitus will go ahead and treat with abx otic drops for a few days

## 2023-08-05 NOTE — Assessment & Plan Note (Signed)
Started pt on prozac 10mg   - follow up in 4 weeks for efficacy

## 2023-08-05 NOTE — Progress Notes (Deleted)
Last Mammogram: n/a Last Pap Smear:  10/19/22- neg Last Colon Screening;  n/a Seat Belts:   *** Sun Screen:   *** Dental Check Up:  *** Brush & Floss:  ***;

## 2023-08-05 NOTE — Progress Notes (Signed)
Established patient visit   Patient: Donna Montgomery   DOB: August 26, 1996   27 y.o. Female  MRN: [id number] Visit Date: 08/05/2023  Today's healthcare provider: Charlton Amor, DO   Chief Complaint  Patient presents with   New Patient (Initial Visit)    Establish Care    SUBJECTIVE    Chief Complaint  Patient presents with   New Patient (Initial Visit)    Establish Care   HPI HPI     New Patient (Initial Visit)    Additional comments: Establish Care      Last edited by Roselyn Reef, CMA on 08/05/2023  2:05 PM.      Pt presents to establish care. She is a Social research officer, government.  MDD/GAD - is open to therapy   R shoulder pain   Tinnitus  - in left ear, occurs with headaches  Review of Systems  Constitutional:  Negative for activity change, fatigue and fever.  Respiratory:  Negative for cough and shortness of breath.   Cardiovascular:  Negative for chest pain.  Gastrointestinal:  Negative for abdominal pain.  Genitourinary:  Negative for difficulty urinating.       Current Meds  Medication Sig   etonogestrel-ethinyl estradiol (NUVARING) 0.12-0.015 MG/24HR vaginal ring Insert vaginally and leave in place for 3 consecutive weeks, then remove for 1 week.   FLUoxetine (PROZAC) 10 MG tablet Take 1 tablet (10 mg total) by mouth daily.   Multiple Vitamin (MULTIVITAMIN WITH MINERALS) TABS tablet Take 1 tablet by mouth daily.   NEOMYCIN-POLYMYXIN-HYDROCORTISONE (CORTISPORIN) 1 % SOLN OTIC solution Place 3 drops into the left ear every 8 (eight) hours for 3 days. Discard remainder    OBJECTIVE    BP 130/85 (BP Location: Left Arm, Patient Position: Sitting, Cuff Size: Large)   Pulse 71   Ht 6\' 5"  (1.956 m)   Wt 164 lb 12 oz (74.7 kg)   SpO2 100%   BMI 19.54 kg/m   Physical Exam Vitals and nursing note reviewed.  Constitutional:      General: She is not in acute distress.    Appearance: Normal appearance.  HENT:     Head: Normocephalic and  atraumatic.     Right Ear: Tympanic membrane, ear canal and external ear normal.     Ears:     Comments: Erythema of external ear canal    Nose: Nose normal.  Eyes:     Conjunctiva/sclera: Conjunctivae normal.  Cardiovascular:     Rate and Rhythm: Normal rate and regular rhythm.  Pulmonary:     Effort: Pulmonary effort is normal.     Breath sounds: Normal breath sounds.  Neurological:     General: No focal deficit present.     Mental Status: She is alert and oriented to person, place, and time.  Psychiatric:        Mood and Affect: Mood normal.        Behavior: Behavior normal.        Thought Content: Thought content normal.        Judgment: Judgment normal.        ASSESSMENT & PLAN    Problem List Items Addressed This Visit       Nervous and Auditory   Acute otitis externa of left ear    Erythema of external ear, in the setting of tinnitus will go ahead and treat with abx otic drops for a few days        Other   GAD (  generalized anxiety disorder)    - started prozac and patient is also open to therapy. Will go ahead and place orders      Relevant Medications   FLUoxetine (PROZAC) 10 MG tablet   Other Relevant Orders   Ambulatory referral to Behavioral Health   Current moderate episode of major depressive disorder without prior episode (HCC)    Started pt on prozac 10mg   - follow up in 4 weeks for efficacy      Relevant Medications   FLUoxetine (PROZAC) 10 MG tablet   Other Relevant Orders   Ambulatory referral to Behavioral Health   Acute pain of right shoulder    R Shoulder exam significant for pain with abduction  Negative apley scratch, negative Yergusons, non-tender to manipulation with internal rotation - will order xrays and have pt follow up with Dr. Karie Schwalbe for further workup for possible ligamentous injury as patient feels a clicking and pulling in her shoulder.      Relevant Orders   DG Shoulder Right   Tinnitus of left ear    Have asked pt to keep a  more detailed history of when this happens. 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 Discussed that it could maybe be a symptoms of her headaches. Asked her to see if when headaches resolve if her tinnitus resolves.      Other Visit Diagnoses     Routine adult health maintenance    -  Primary   Relevant Orders   CBC with Differential   CMP14+EGFR   Lipid panel       No follow-ups on file.      Meds ordered this encounter  Medications   FLUoxetine (PROZAC) 10 MG tablet    Sig: Take 1 tablet (10 mg total) by mouth daily.    Dispense:  30 tablet    Refill:  3   NEOMYCIN-POLYMYXIN-HYDROCORTISONE (CORTISPORIN) 1 % SOLN OTIC solution    Sig: Place 3 drops into the left ear every 8 (eight) hours for 3 days. Discard remainder    Dispense:  10 mL    Refill:  0    Orders Placed This Encounter  Procedures   DG Shoulder Right    Standing Status:   Future    Number of Occurrences:   1    Standing Expiration Date:   08/04/2024    Order Specific Question:   Reason for Exam (SYMPTOM  OR DIAGNOSIS REQUIRED)    Answer:   right shoulder pain    Order Specific Question:   Is patient pregnant?    Answer:   No    Order Specific Question:   Preferred imaging location?    Answer:   [facility]   CBC with Differential   CMP14+EGFR    Order Specific Question:   Has the patient fasted?    Answer:   No   Lipid panel    Order Specific Question:   Has the patient fasted?    Answer:   No    Order Specific Question:   Release to patient    Answer:   Immediate   Ambulatory referral to Behavioral Health    Referral Priority:   Routine    Referral Type:   Psychiatric    Referral Reason:   Specialty Services Required    Requested Specialty:   Behavioral Health  Number of Visits Requested:   1     Charlton Amor, DO  [facility]  at [facility] [phone] (phone) [phone] (fax)  [facility]

## 2023-08-05 NOTE — Assessment & Plan Note (Signed)
R Shoulder exam significant for pain with abduction  Negative apley scratch, negative Yergusons, non-tender to manipulation with internal rotation - will order xrays and have pt follow up with Dr. Karie Schwalbe for further workup for possible ligamentous injury as patient feels a clicking and pulling in her shoulder.

## 2023-08-05 NOTE — Telephone Encounter (Signed)
Left patient a message. Does not need an Annual, done 10/19/2022.

## 2023-08-05 NOTE — Assessment & Plan Note (Signed)
Have asked pt to keep a more detailed history of when this happens. 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 Discussed that it could maybe be a symptoms of her headaches. Asked her to see if when headaches resolve if her tinnitus resolves.

## 2023-08-06 ENCOUNTER — Ambulatory Visit: Payer: Medicaid Other | Admitting: Obstetrics & Gynecology

## 2023-08-06 LAB — CMP14+EGFR
ALT: 11 [IU]/L (ref 0–32)
AST: 20 [IU]/L (ref 0–40)
Albumin: 4.5 g/dL (ref 4.0–5.0)
Alkaline Phosphatase: 62 [IU]/L (ref 44–121)
BUN/Creatinine Ratio: 16 (ref 9–23)
BUN: 14 mg/dL (ref 6–20)
Bilirubin Total: 1.3 mg/dL — ABNORMAL HIGH (ref 0.0–1.2)
CO2: 23 mmol/L (ref 20–29)
Calcium: 9.8 mg/dL (ref 8.7–10.2)
Chloride: 103 mmol/L (ref 96–106)
Creatinine, Ser: 0.89 mg/dL (ref 0.57–1.00)
Globulin, Total: 3.2 g/dL (ref 1.5–4.5)
Glucose: 79 mg/dL (ref 70–99)
Potassium: 4.2 mmol/L (ref 3.5–5.2)
Sodium: 142 mmol/L (ref 134–144)
Total Protein: 7.7 g/dL (ref 6.0–8.5)
eGFR: 91 mL/min/{1.73_m2} (ref >59)

## 2023-08-06 LAB — CBC WITH DIFFERENTIAL/PLATELET
Basophils Absolute: 0.1 10*3/uL (ref 0.0–0.2)
Basos: 1 %
EOS (ABSOLUTE): 0.1 10*3/uL (ref 0.0–0.4)
Eos: 2 %
Hematocrit: 40.9 % (ref 34.0–46.6)
Hemoglobin: 13.4 g/dL (ref 11.1–15.9)
Immature Grans (Abs): 0 10*3/uL (ref 0.0–0.1)
Immature Granulocytes: 0 %
Lymphocytes Absolute: 2.1 10*3/uL (ref 0.7–3.1)
Lymphs: 38 %
MCH: 29.6 pg (ref 26.6–33.0)
MCHC: 32.8 g/dL (ref 31.5–35.7)
MCV: 91 fL (ref 79–97)
Monocytes Absolute: 0.4 10*3/uL (ref 0.1–0.9)
Monocytes: 7 %
Neutrophils Absolute: 2.9 10*3/uL (ref 1.4–7.0)
Neutrophils: 52 %
Platelets: 376 10*3/uL (ref 150–450)
RBC: 4.52 x10E6/uL (ref 3.77–5.28)
RDW: 12.6 % (ref 11.7–15.4)
WBC: 5.5 10*3/uL (ref 3.4–10.8)

## 2023-08-06 LAB — LIPID PANEL
Chol/HDL Ratio: 2.6 ratio (ref 0.0–4.4)
Cholesterol, Total: 203 mg/dL — ABNORMAL HIGH (ref 100–199)
HDL: 77 mg/dL (ref >39)
LDL Chol Calc (NIH): 110 mg/dL — ABNORMAL HIGH (ref 0–99)
Triglycerides: 92 mg/dL (ref 0–149)
VLDL Cholesterol Cal: 16 mg/dL (ref 5–40)

## 2023-08-11 ENCOUNTER — Encounter: Payer: Self-pay | Admitting: Family Medicine

## 2023-08-11 MED ORDER — FLUOXETINE HCL 20 MG PO TABS
20.0000 mg | ORAL_TABLET | Freq: Every day | ORAL | 0 refills | Status: DC
Start: 2023-08-11 — End: 2023-10-18

## 2023-08-16 ENCOUNTER — Ambulatory Visit: Payer: Medicaid Other | Admitting: Family Medicine

## 2023-08-16 ENCOUNTER — Other Ambulatory Visit: Payer: Self-pay

## 2023-08-16 ENCOUNTER — Encounter: Payer: Self-pay | Admitting: Family Medicine

## 2023-08-16 VITALS — BP 102/62 | Ht 77.0 in | Wt 164.0 lb

## 2023-08-16 DIAGNOSIS — G8929 Other chronic pain: Secondary | ICD-10-CM | POA: Diagnosis not present

## 2023-08-16 DIAGNOSIS — M778 Other enthesopathies, not elsewhere classified: Secondary | ICD-10-CM | POA: Diagnosis not present

## 2023-08-16 DIAGNOSIS — M25511 Pain in right shoulder: Secondary | ICD-10-CM | POA: Diagnosis not present

## 2023-08-16 DIAGNOSIS — M7551 Bursitis of right shoulder: Secondary | ICD-10-CM | POA: Diagnosis not present

## 2023-08-16 DIAGNOSIS — M67921 Unspecified disorder of synovium and tendon, right upper arm: Secondary | ICD-10-CM | POA: Diagnosis not present

## 2023-08-16 NOTE — Addendum Note (Signed)
Addended by: Annita Brod on: 08/16/2023 11:58 AM   Modules accepted: Orders

## 2023-08-16 NOTE — Progress Notes (Signed)
CHIEF COMPLAINT: No chief complaint on file.  _____________________________________________________________ SUBJECTIVE  HPI  Pt is a 27 y.o. female here for evaluation of R shoulder pain/weakness  Athlete/Sport:Professional Basketball  08/05/2023 established care with PCP, at that time had described acute right shoulder pain with abduction, x-ray had been obtained.  R shoulder bothersome with day to day activities or lifting, even sleeping.  States is an old injury; remembers having difficulty lifting it in the past. Has treated conservatively with athletic trainer on the team. Pain ongoing for years. Has been worsening since coming back from [place] worsening since August, refractory to conservative management over the last 4 months No numbness/tingling Lateral shoulder pain, aching in nature, daily and at rest. Exacerbated with reaching behind her back, back flys, chest flys, will produce lateral/internal clicking/grinding  Leaves to play in [place] tomorrow, will be overseas for several months, requested evaluation prior to leaving  In the past has been seen in clinic for left knee pain and hip pain (03/2019; note reviewed; MRI from [place] had been reviewed at that time, with intact ligaments, effusion noted, no meniscus tear) ------------------------------------------------------------------------------------------------------ Past Medical History:  Diagnosis Date   Allergy    Anemia 2014   My blood tests in college said i had low iron and I was given medication. It has been low since.   Anxiety 2014   Had anxiety my whole life, but recognized in college by health professional.   Asthma    Depression 2014   Noticed symptoms in college, was put onto two different medicines. Stopped taking medication and seeked therapy. Symptoms come and go.   Ulcer 2013   Bleeding ulcer from H. Bacteria.    Past Surgical History:  Procedure Laterality Date   FRACTURE SURGERY  2017   Hand  fracture   OPEN REDUCTION INTERNAL FIXATION (ORIF) HAND Left 2017      No outpatient medications have been marked as taking for the 08/16/23 encounter (Appointment) with Burna Forts, MD.    ------------------------------------------------------------------------------------------------------  _____________________________________________________________ OBJECTIVE  PHYSICAL EXAM  Today's Vitals   08/16/23 1032  BP: 102/62  Weight: 164 lb (74.4 kg)  Height: 6\' 5"  (1.956 m)   Body mass index is 19.45 kg/m.   reviewed  General: A+Ox3, no acute distress, well-nourished, appropriate affect CV: pulses 2+ regular, nondiaphoretic, no peripheral edema, cap refill <2sec Lungs: no audible wheezing, non-labored breathing, bilateral chest rise/fall, nontachypneic Skin: warm, well-perfused, non-icteric, no susp lesions or rashes Neuro: no focal deficits. Sensation intact, muscle tone wnl, no atrophy Psych: no signs of depression or anxiety MSK:   R Shoulder:  No deformity, swelling or muscle wasting No scapular winging, some scapular dyskinesis noted FF 180, abd 180, int 0, ext 90; pain elicited at 110 degrees of flexion, 95 degrees of abduction, negative painful arc Some discomfort with palpation cervicothoracic paraspinal musculature NTTP over the [place], clavicle, ac, coracoid, biceps groove, humerus, deltoid, subacromial space, scap spine, trap, cervical spine Neg neer, hawkins, +empty can/weakness, scarf test, hornblower, resisted anterior flexion, +subscap liftoff/pain, +speeds/weakness, -obriens Neg sulcus sign Negative Spurling's test bilat FROM of neck  XR R shoulder independently reviewed, preserved joint spacing, neutral humeral head alignment, no notable fractures, no appreciable dislocation, no notable degenerative changes _____________________________________________________________ ASSESSMENT/PLAN Diagnoses and all orders for this visit:  Chronic right shoulder pain -      MR SHOULDER RIGHT WO CONTRAST; Future  Reviewed XR films with patient, there is daily at rest with R shoulder/unilateral weakness on activation of supraspinatus,  subscap. Pain at rest with +speed's in setting of unremarkable XR does raise concern for possible intraarticular/labral involvement. Given Cathlyn's profession in sports, unilateral weakness can be limiting in ability to play/compete. Given her age and profession, diagnosis of labral injury warrants expedited MRI diagnosis to rule in/out need for surgical intervention. All questions answered. Return precautions discussed. Patient verbalized understanding and is in agreement with plan. Ideally follow-up after MRI results vs PRN pending her travel schedule/[place] the states  Electronically signed by: Burna Forts, MD 08/16/2023 7:52 AM

## 2023-08-17 ENCOUNTER — Ambulatory Visit: Payer: Medicaid Other | Admitting: Sports Medicine

## 2023-08-19 ENCOUNTER — Telehealth: Payer: Medicaid Other | Admitting: Family Medicine

## 2023-08-19 ENCOUNTER — Encounter: Payer: Self-pay | Admitting: Family Medicine

## 2023-08-19 DIAGNOSIS — F411 Generalized anxiety disorder: Secondary | ICD-10-CM

## 2023-08-19 DIAGNOSIS — F321 Major depressive disorder, single episode, moderate: Secondary | ICD-10-CM | POA: Diagnosis not present

## 2023-08-19 NOTE — Assessment & Plan Note (Signed)
Continue prozac 82m

## 2023-08-19 NOTE — Progress Notes (Signed)
Established patient visit   Patient: Donna Montgomery   DOB: 1996-06-24   27 y.o. Female  MRN: [id number] Visit Date: 08/19/2023  Today's healthcare provider: Charlton Amor, DO   Chief Complaint  Patient presents with   Medical Management of Chronic Issues    Prozac f/u    SUBJECTIVE    Chief Complaint  Patient presents with   Medical Management of Chronic Issues    Prozac f/u   HPI HPI     Medical Management of Chronic Issues    Additional comments: Prozac f/u      Last edited by Roselyn Reef, CMA on 08/19/2023 12:04 PM.      I connected with  Donna Montgomery on 08/19/23 by a video and audio enabled telemedicine application and verified that I am speaking with the correct person using two identifiers.  Patient Location: Home  Provider Location: Office/Clinic  I discussed the limitations of evaluation and management by telemedicine. The patient expressed understanding and agreed to proceed.   Review of Systems  Constitutional:  Negative for activity change, fatigue and fever.  Respiratory:  Negative for cough and shortness of breath.   Cardiovascular:  Negative for chest pain.  Gastrointestinal:  Negative for abdominal pain.  Genitourinary:  Negative for difficulty urinating.       Current Meds  Medication Sig   etonogestrel-ethinyl estradiol (NUVARING) 0.12-0.015 MG/24HR vaginal ring Insert vaginally and leave in place for 3 consecutive weeks, then remove for 1 week.   FLUoxetine (PROZAC) 20 MG tablet Take 1 tablet (20 mg total) by mouth daily.   Multiple Vitamin (MULTIVITAMIN WITH MINERALS) TABS tablet Take 1 tablet by mouth daily.    OBJECTIVE    There were no vitals taken for this visit.  Physical Exam Vitals and nursing note reviewed.  Constitutional:      General: She is not in acute distress.    Appearance: Normal appearance.  HENT:     Head: Normocephalic and atraumatic.     Right Ear: External ear normal.     Left Ear: External ear  normal.     Nose: Nose normal.  Eyes:     Conjunctiva/sclera: Conjunctivae normal.  Cardiovascular:     Rate and Rhythm: Normal rate.  Pulmonary:     Effort: Pulmonary effort is normal.  Neurological:     General: No focal deficit present.     Mental Status: She is alert and oriented to person, place, and time.  Psychiatric:        Mood and Affect: Mood normal.        Behavior: Behavior normal.        Thought Content: Thought content normal.        Judgment: Judgment normal.        ASSESSMENT & PLAN    Problem List Items Addressed This Visit       Other   GAD (generalized anxiety disorder)    Continue prozac 20mg , pt doing well and is not feeling like she has been on edge      Current moderate episode of major depressive disorder without prior episode (HCC) - Primary    Continue prozac 20mg        No follow-ups on file.      No orders of the defined types were placed in this encounter.   No orders of the defined types were placed in this encounter.    Charlton Amor, DO  [facility] at [employer] [phone] (phone) [phone] (fax)  [facility]

## 2023-08-19 NOTE — Assessment & Plan Note (Signed)
Continue prozac 20mg , pt doing well and is not feeling like she has been on edge

## 2023-09-29 ENCOUNTER — Telehealth: Payer: Self-pay | Admitting: *Deleted

## 2023-09-29 NOTE — Telephone Encounter (Signed)
No answer or voicemail to leave a message. 

## 2023-10-11 ENCOUNTER — Other Ambulatory Visit: Payer: Self-pay | Admitting: Family Medicine

## 2023-10-12 ENCOUNTER — Other Ambulatory Visit: Payer: Self-pay | Admitting: Family Medicine

## 2023-12-13 ENCOUNTER — Encounter: Payer: Self-pay | Admitting: Family Medicine

## 2023-12-13 NOTE — Telephone Encounter (Signed)
Please schedule with Dr. T. 

## 2023-12-14 ENCOUNTER — Ambulatory Visit
Admission: RE | Admit: 2023-12-14 | Discharge: 2023-12-14 | Disposition: A | Discharge status: Home / Self Care | Source: Ambulatory Visit | Attending: Family Medicine | Admitting: Family Medicine

## 2023-12-14 ENCOUNTER — Ambulatory Visit: Admitting: Family Medicine

## 2023-12-14 VITALS — BP 112/82 | Ht 77.0 in | Wt 165.0 lb

## 2023-12-14 DIAGNOSIS — M2392 Unspecified internal derangement of left knee: Secondary | ICD-10-CM

## 2023-12-14 DIAGNOSIS — M25562 Pain in left knee: Secondary | ICD-10-CM | POA: Diagnosis not present

## 2023-12-14 NOTE — Progress Notes (Unsigned)
 DATE OF VISIT: 12/14/2023        Donna Montgomery DOB: Nov 19, 1995 MRN: [id number]  CC:  knee pain  History- Donna Montgomery is a 28 y.o. female for evaluation and treatment of knee pain Was playing basketball in [place] Pain in the back of the knee ***  Past Medical History Past Medical History:  Diagnosis Date   Allergy    Anemia 2014   My blood tests in college said i had low iron and I was given medication. It has been low since.   Anxiety 2014   Had anxiety my whole life, but recognized in college by health professional.   Asthma    Depression 2014   Noticed symptoms in college, was put onto two different medicines. Stopped taking medication and seeked therapy. Symptoms come and go.   Ulcer 2013   Bleeding ulcer from H. Bacteria.    Past Surgical History Past Surgical History:  Procedure Laterality Date   FRACTURE SURGERY  2017   Hand fracture   OPEN REDUCTION INTERNAL FIXATION (ORIF) HAND Left 2017    Medications Current Outpatient Medications  Medication Sig Dispense Refill   etonogestrel-ethinyl estradiol (NUVARING) 0.12-0.015 MG/24HR vaginal ring Insert vaginally and leave in place for 3 consecutive weeks, then remove for 1 week. 1 each 3   FLUoxetine (PROZAC) 20 MG tablet TAKE 1 TABLET BY MOUTH DAILY 90 tablet 0   Multiple Vitamin (MULTIVITAMIN WITH MINERALS) TABS tablet Take 1 tablet by mouth daily.     No current facility-administered medications for this visit.    Allergies is allergic to pollen extract and other.  Family History - reviewed per EMR and intake form  Social History   reports that she does not currently use alcohol.  reports that she has never smoked. She has never used smokeless tobacco.  reports no history of drug use. OCCUPATION: ***   EXAM: Vitals: BP 112/82   Ht 6\' 5"  (1.956 m)   Wt 165 lb (74.8 kg)   BMI 19.57 kg/m  General: AOx3, NAD, pleasant SKIN: no rashes or lesions, skin clean, dry, intact MSK: ***  NEURO: sensation  intact to light touch, DTR +***/4 *** bilaterally VASC: pulses 2+ and symmetric *** bilaterally, no edema  IMAGING: XRAYS: ***   Assessment & Plan Internal derangement of left knee   Patient expressed understanding & agreement with above.  No diagnosis found.  No orders of the defined types were placed in this encounter.   No orders of the defined types were placed in this encounter.

## 2023-12-15 ENCOUNTER — Encounter: Payer: Self-pay | Admitting: Family Medicine

## 2023-12-16 ENCOUNTER — Ambulatory Visit
Admission: RE | Admit: 2023-12-16 | Discharge: 2023-12-16 | Discharge status: Home / Self Care | Source: Ambulatory Visit | Attending: Family Medicine | Admitting: Family Medicine

## 2023-12-16 DIAGNOSIS — M25462 Effusion, left knee: Secondary | ICD-10-CM | POA: Diagnosis not present

## 2023-12-16 DIAGNOSIS — M2392 Unspecified internal derangement of left knee: Secondary | ICD-10-CM

## 2023-12-16 DIAGNOSIS — M23352 Other meniscus derangements, posterior horn of lateral meniscus, left knee: Secondary | ICD-10-CM | POA: Diagnosis not present

## 2023-12-16 DIAGNOSIS — M25562 Pain in left knee: Secondary | ICD-10-CM | POA: Diagnosis not present

## 2023-12-25 ENCOUNTER — Other Ambulatory Visit: Payer: Self-pay | Admitting: Medical Genetics

## 2023-12-27 ENCOUNTER — Other Ambulatory Visit (HOSPITAL_COMMUNITY)
Admission: RE | Admit: 2023-12-27 | Discharge: 2023-12-27 | Disposition: A | Discharge status: Home / Self Care | Payer: Self-pay | Source: Ambulatory Visit | Attending: Medical Genetics | Admitting: Medical Genetics

## 2023-12-27 ENCOUNTER — Encounter: Payer: Self-pay | Admitting: Family Medicine

## 2023-12-27 ENCOUNTER — Other Ambulatory Visit: Payer: Self-pay

## 2023-12-27 DIAGNOSIS — G8929 Other chronic pain: Secondary | ICD-10-CM

## 2023-12-27 DIAGNOSIS — M2392 Unspecified internal derangement of left knee: Secondary | ICD-10-CM

## 2023-12-27 NOTE — Progress Notes (Signed)
 MRI results reviewed.  Has fraying of lateral meniscus and underlying OA.  MyChart message sent.

## 2023-12-28 ENCOUNTER — Ambulatory Visit: Attending: Family Medicine

## 2023-12-28 ENCOUNTER — Other Ambulatory Visit: Payer: Self-pay

## 2023-12-28 DIAGNOSIS — M6281 Muscle weakness (generalized): Secondary | ICD-10-CM | POA: Insufficient documentation

## 2023-12-28 DIAGNOSIS — M25562 Pain in left knee: Secondary | ICD-10-CM | POA: Insufficient documentation

## 2023-12-28 DIAGNOSIS — M2392 Unspecified internal derangement of left knee: Secondary | ICD-10-CM | POA: Diagnosis not present

## 2023-12-28 DIAGNOSIS — M25511 Pain in right shoulder: Secondary | ICD-10-CM | POA: Diagnosis not present

## 2023-12-28 DIAGNOSIS — G8929 Other chronic pain: Secondary | ICD-10-CM | POA: Diagnosis not present

## 2023-12-28 NOTE — Therapy (Signed)
 OUTPATIENT PHYSICAL THERAPY EVALUATION   Patient Name: Donna Montgomery MRN: [id number] DOB:12/20/1995, 28 y.o., female Today's Date: 12/28/2023  END OF SESSION:  PT End of Session - 12/28/23 1446     Visit Number 1    Number of Visits 17    Date for PT Re-Evaluation 02/26/24    Authorization Type [employer]    PT Start Time 1445    PT Stop Time 1530    PT Time Calculation (min) 45 min    Activity Tolerance Patient tolerated treatment well    Behavior During Therapy WFL for tasks assessed/performed             Past Medical History:  Diagnosis Date   Allergy    Anemia 2014   My blood tests in college said i had low iron and I was given medication. It has been low since.   Anxiety 2014   Had anxiety my whole life, but recognized in college by health professional.   Asthma    Depression 2014   Noticed symptoms in college, was put onto two different medicines. Stopped taking medication and seeked therapy. Symptoms come and go.   Ulcer 2013   Bleeding ulcer from H. Bacteria.   Past Surgical History:  Procedure Laterality Date   FRACTURE SURGERY  2017   Hand fracture   OPEN REDUCTION INTERNAL FIXATION (ORIF) HAND Left 2017   Patient Active Problem List   Diagnosis Date Noted   GAD (generalized anxiety disorder) 08/05/2023   Current moderate episode of major depressive disorder without prior episode (HCC) 08/05/2023   Acute pain of right shoulder 08/05/2023   Acute otitis externa of left ear 08/05/2023   Tinnitus of left ear 08/05/2023   Menorrhagia with regular cycle 10/24/2022   Iron deficiency anemia 10/24/2022   Depression, recurrent (HCC) 03/25/2020   Anxiety 03/25/2020   Chronic toe pain, right foot 07/27/2017   Asthma 08/29/2010   EXERCISE INDUCED ASTHMA 07/11/2010    PCP: Josepha Nickels, DO   REFERRING PROVIDER: Rodgers Clack, DO   REFERRING DIAG:  [phone] (ICD-10-CM) - Internal derangement of left knee  M25.511,G89.29 (ICD-10-CM) - Chronic right  shoulder pain    THERAPY DIAG:  Acute pain of left knee  Chronic right shoulder pain  Muscle weakness (generalized)  Rationale for Evaluation and Treatment: Rehabilitation  ONSET DATE: shoulder exacerbation August 2023; knee March 2024   SUBJECTIVE:   SUBJECTIVE STATEMENT: Patient was playing basketball in early March and the first incident of knee pain was running to block a ball and had an ultrasound and was told by the doctor that she inflamed her hamstring tendon. She kept playing and then in the same game she got a fast break and was fouled and she landed on the players foot and her knee went out to the side. She denies hearing any popping/clicking. She kept playing, but was limping. She continued to play in games, but did not practice until end of March. She worked with the PT while recovering, but didn't do a ton of strengthening. [place] and was referred to PT. The pain is located along the lateral hamstring. She reports there is a "new sound" in the knee since her injury, but does not think it is popping/clicking. Initially she had instances of the knee giving away, but not recently. Since returning home she has been resting and relaxing with pilates being her only exercise currently.   Patient reports she  is right handed and blocks a lot of shots. Her Rt shoulder has been bothering her a lot more this past year compared to previous years. It hurts to sleep on the right shoulder and feels weak when she tries to move it. She denies any popping/clicking or feelings of instability. She reports shoulder pain has been ongoing for past 5 years, but does recall blocking a shot in August and started to have more shoulder pain after this.     PERTINENT HISTORY: ORIF Lt hand  Exercise induced asthma  PAIN:  Are you having pain? Yes: NPRS scale: 2 currently, at worst 7 (Rt shoulder); at worst 7 (knee) Pain location: Lt posterolateral knee; Rt anterior  shoulder Pain description: dull, ache (knee); weak, sharp (shoulder) Aggravating factors: bending, prolonged walking, sleep (knee); overhead lifting,raising (shoulder) Relieving factors: ice,strengthening  PRECAUTIONS: None  RED FLAGS: None   WEIGHT BEARING RESTRICTIONS: No  FALLS:  Has patient fallen in last 6 months? Yes. Number of falls plays basketball (multiple falls)   LIVING ENVIRONMENT: Lives with: lives with their family Lives in: House/apartment Stairs: Yes: Internal: 15 steps; on right going up Has following equipment at home: None  OCCUPATION: professional basketball player; out of season for now  PLOF: Independent  PATIENT GOALS: "I want to be able to bend it." (Knee); "sleep on it." (Shoulder); main goal is return to basketball   NEXT MD VISIT: nothing scheduled   OBJECTIVE:  Note: Objective measures were completed at Evaluation unless otherwise noted.  DIAGNOSTIC FINDINGS:  Lt knee MRI: IMPRESSION: 1. Free edge fraying of the posterior horn and body of the lateral meniscus without discrete radial tear or displaced meniscal fragment. 2. The medial meniscus, cruciate and collateral ligaments are intact. 3. Prominent tricompartmental degenerative changes for age, most advanced in the patellofemoral and lateral compartments. No acute osseous findings. 4. Small knee joint effusion.  Rt Shoulder X-ray: IMPRESSION: Negative radiographs of the right shoulder.  PATIENT SURVEYS:  Patient-specific activity scoring scheme (Point to one number):  "0" represents "unable to perform." "10" represents "able to perform at prior level. 0 1 2 3 4 5 6 7 8 9  10 (Date and Score) Activity Initial  Activity Eval     Squatting   6    Sleeping on Rt side  0    Running 0   Jumping  0    Additional Additional Total score = sum of the activity scores/number of activities Minimum detectable change (90%CI) for average score = 2 points Minimum detectable change (90%CI)  for single activity score = 3 points PSFS developed by: Jake Seats., & Binkley, J. (1995). Assessing disability and change on individual  patients: a report of a patient specific measure. Physiotherapy [place], [phone], [phone]. Reproduced with the permission of the authors  Score: 1.5 total    COGNITION: Overall cognitive status: Within functional limits for tasks assessed     SENSATION: Not tested  EDEMA:  No obvious swelling about the knee or shoulder   MUSCLE LENGTH: Hamstrings: Right lacking 2 deg; Left lacking 35 deg pain   POSTURE: No Significant postural limitations  PALPATION: TTP left lateral hamstring Did not palpate Rt shoulder   LOWER EXTREMITY ROM:  Active ROM Right eval Left eval  Hip flexion    Hip extension    Hip abduction    Hip adduction    Hip internal rotation    Hip external rotation    Knee flexion 135 119 pain  Knee  extension Full  Full   Ankle dorsiflexion    Ankle plantarflexion    Ankle inversion    Ankle eversion     (Blank rows = not tested)  LOWER EXTREMITY MMT:  MMT Right eval Left eval  Hip flexion 4+ 4  Hip extension 4+ 4+  Hip abduction 4+ 4- pain   Hip adduction    Hip internal rotation    Hip external rotation    Knee flexion 5 4+ pain   Knee extension 4+ 4+ pain  Ankle dorsiflexion    Ankle plantarflexion    Ankle inversion    Ankle eversion     (Blank rows = not tested)  UPPER EXTREMITY ROM:  Active ROM Right eval Left eval  Shoulder flexion 150 180  Shoulder extension    Shoulder abduction 180 pain 180  Shoulder adduction    Shoulder extension    Shoulder internal rotation 65 85  Shoulder external rotation 90 90  Elbow flexion    Elbow extension    Wrist flexion    Wrist extension    Wrist ulnar deviation    Wrist radial deviation    Wrist pronation    Wrist supination     (Blank rows = not tested)   UPPER EXTREMITY MMT:  MMT Right eval Left eval  Shoulder flexion 4  5  Shoulder extension    Shoulder abduction 5 5  Shoulder adduction    Shoulder extension    Shoulder internal rotation 5 5  Shoulder external rotation 4- pain  4  Middle trapezius 4 pain 4+  Lower trapezius    Elbow flexion    Elbow extension    Wrist flexion    Wrist extension    Wrist ulnar deviation    Wrist radial deviation    Wrist pronation    Wrist supination    Grip strength     (Blank rows = not tested)  SPECIAL TESTS:  (+) Yergason's, Speeds, Neer's, Hawkin's Kennedy (-) Empty Can  (+) McMurray's for pain  (-) Anterior drawer, posterior drawer, valgus, varus   FUNCTIONAL TESTS:  Squat: WNL pain left lateral knee  GAIT: Distance walked: 10 ft  Assistive device utilized: None Level of assistance: Complete Independence Comments: no obvious gait abnormalities                                                                                                                                 [facility] Adult PT Treatment:                                                DATE: 12/28/23 Therapeutic Exercise: Demonstrated,performed, and issued initial HEP.       PATIENT EDUCATION:  Education details: see treatment; POC Person educated: Patient Education method: Explanation, Demonstration, Tactile cues, Verbal  cues, and Handouts Education comprehension: verbalized understanding, returned demonstration, verbal cues required, tactile cues required, and needs further education  HOME EXERCISE PROGRAM: Access Code: [email or web address] URL: [email or web address] Date: 12/28/2023 Prepared by: Forrestine Ike  Exercises - Shoulder External Rotation and Scapular Retraction with Resistance  - 2 x daily - 7 x weekly - 2 sets - 10 reps - Seated Hamstring Stretch  - 2 x daily - 7 x weekly - 3 sets - 30 sec  hold - Supine Isometric Hamstring Set  - 2 x daily - 7 x weekly - 2 sets - 10 reps - 5 sec  hold  ASSESSMENT:  CLINICAL IMPRESSION: Patient is a 28 y.o. female who was seen  today for physical therapy evaluation and treatment for Lt knee pain and Rt shoulder pain.  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 Her Rt shoulder pain has been present for the past 5 years, but recently worsened in the fall with a blocking maneuver while playing basketball. In regards to the Lt knee she is noted to have palpable tenderness about Lt lateral hamstring and lateral joint line, pain and weakness with Lt knee MMT, and bilateral hip weakness. In regards to the Rt shoulder she has positive impingement and bicep special testing, limited shoulder flexion and IR AROM, Rt shoulder weakness, and periscapular weakness. Patient will benefit from skilled PT to address the above stated deficits in order to optimize their function and assist in overall pain reduction.    OBJECTIVE IMPAIRMENTS: decreased activity tolerance, decreased balance, decreased endurance, decreased knowledge of condition, difficulty walking, decreased ROM, decreased strength, increased fascial restrictions, impaired flexibility, impaired UE functional use, improper body mechanics, and pain.   ACTIVITY LIMITATIONS: carrying, lifting, bending, standing, squatting, sleeping, stairs, reach over head, and locomotion level  PARTICIPATION LIMITATIONS: cleaning, laundry, shopping, community activity, and occupation  PERSONAL FACTORS: Age, Past/current experiences, Profession, and Time since onset of injury/illness/exacerbation are also affecting patient's functional outcome.   REHAB POTENTIAL: Good  CLINICAL DECISION MAKING: Evolving/moderate complexity  EVALUATION COMPLEXITY: Moderate   GOALS: Goals reviewed with patient?  Yes  SHORT TERM GOALS: Target date: 01/25/2024   Patient will be independent and compliant with initial HEP.   Baseline: issued at eval Goal status: INITIAL  2.  Patient will demonstrate at least 130 degrees of pain free Lt knee flexion AROM to improve ability to complete squatting activity.  Baseline: see above Goal status: INITIAL  3.  Patient will demonstrate 180 degrees of Rt shoulder flexion AROM to improve ability to block in basketball Baseline: see above Goal status: INITIAL  4.  Patient will demonstrate at least 80 degrees of Rt shoulder IR AROM to improve ability to complete self-care activities.  Baseline: see above  Goal status: INITIAL    LONG TERM GOALS: Target date: 02/26/24  Patient will score >/= 5 on PSFS to signify clinically meaningful improvement in functional abilities.  Baseline:  Goal status: INITIAL  2.  Patient will squat without pain to improve ability to maintain defensive stance in basketball.  Baseline: Lt knee pain Goal status: INITIAL  3.  Patient will be able to run at least 10 minutes without onset of knee pain.  Baseline: unable Goal status: INITIAL  4.  Patient will demonstrate 5/5 LLE strength to improve stability with jumping.  Baseline: see above Goal status: INITIAL  5.  Patient will demonstrate 5/5 Rt shoulder strength to improve tolerance to basketball shooting/throwing.  Baseline: see above  Goal status: INITIAL   PLAN:  PT FREQUENCY: 2x/week  PT DURATION: 8 weeks  PLANNED INTERVENTIONS: 97164- PT Re-evaluation, 97110-Therapeutic exercises, 97530- Therapeutic activity, [id number]- Neuromuscular re-education, 97535- Self Care, 11914- Manual therapy, [id number]- Gait training, [phone]- Aquatic Therapy, Taping, Dry Needling, Cryotherapy, and Moist heat  PLAN FOR NEXT SESSION: review and progress HEP prn; progress hamstring strength/stretching as tolerated. Hip strengthening, shoulder strengthening. Shoulder IR and flexion ROM. Assess  GHJ mobility.   Lliam Hoh, PT, DPT, ATC 12/28/23 5:02 PM  For all possible CPT codes, reference the Planned Interventions line above.     Check all conditions that are expected to impact treatment: {Conditions expected to impact treatment:Psychological or psychiatric disorders   If treatment provided at initial evaluation, no treatment charged due to lack of authorization.

## 2023-12-30 ENCOUNTER — Ambulatory Visit

## 2023-12-30 DIAGNOSIS — M2392 Unspecified internal derangement of left knee: Secondary | ICD-10-CM | POA: Diagnosis not present

## 2023-12-30 DIAGNOSIS — G8929 Other chronic pain: Secondary | ICD-10-CM | POA: Diagnosis not present

## 2023-12-30 DIAGNOSIS — M25562 Pain in left knee: Secondary | ICD-10-CM | POA: Diagnosis not present

## 2023-12-30 DIAGNOSIS — M25511 Pain in right shoulder: Secondary | ICD-10-CM | POA: Diagnosis not present

## 2023-12-30 DIAGNOSIS — M6281 Muscle weakness (generalized): Secondary | ICD-10-CM | POA: Diagnosis not present

## 2023-12-30 NOTE — Therapy (Signed)
 OUTPATIENT PHYSICAL THERAPY TREATMENT   Patient Name: Donna Montgomery MRN: [id number] DOB:11-07-1995, 28 y.o., female Today's Date: 12/30/2023  END OF SESSION:  PT End of Session - 12/30/23 1148     Visit Number 2    Number of Visits 17    Date for PT Re-Evaluation 02/26/24    Authorization Type [employer]    Authorization Time Period 4/15-6/13/25    Authorization - Visit Number 1    Authorization - Number of Visits 5    PT Start Time 1148    PT Stop Time 1231    PT Time Calculation (min) 43 min    Activity Tolerance Patient tolerated treatment well    Behavior During Therapy WFL for tasks assessed/performed             Past Medical History:  Diagnosis Date   Allergy    Anemia 2014   My blood tests in college said i had low iron and I was given medication. It has been low since.   Anxiety 2014   Had anxiety my whole life, but recognized in college by health professional.   Asthma    Depression 2014   Noticed symptoms in college, was put onto two different medicines. Stopped taking medication and seeked therapy. Symptoms come and go.   Ulcer 2013   Bleeding ulcer from H. Bacteria.   Past Surgical History:  Procedure Laterality Date   FRACTURE SURGERY  2017   Hand fracture   OPEN REDUCTION INTERNAL FIXATION (ORIF) HAND Left 2017   Patient Active Problem List   Diagnosis Date Noted   GAD (generalized anxiety disorder) 08/05/2023   Current moderate episode of major depressive disorder without prior episode (HCC) 08/05/2023   Acute pain of right shoulder 08/05/2023   Acute otitis externa of left ear 08/05/2023   Tinnitus of left ear 08/05/2023   Menorrhagia with regular cycle 10/24/2022   Iron deficiency anemia 10/24/2022   Depression, recurrent (HCC) 03/25/2020   Anxiety 03/25/2020   Chronic toe pain, right foot 07/27/2017   Asthma 08/29/2010   EXERCISE INDUCED ASTHMA 07/11/2010    PCP: Josepha Nickels, DO   REFERRING PROVIDER: Rodgers Clack, DO    REFERRING DIAG:  [phone] (ICD-10-CM) - Internal derangement of left knee  M25.511,G89.29 (ICD-10-CM) - Chronic right shoulder pain    THERAPY DIAG:  Acute pain of left knee  Chronic right shoulder pain  Muscle weakness (generalized)  Rationale for Evaluation and Treatment: Rehabilitation  ONSET DATE: shoulder exacerbation August 2023; knee March 2024   SUBJECTIVE:   SUBJECTIVE STATEMENT: Patient reports exercises are going well. Has some pain with resisted ER, but not terrible. Able to increase range with hamstring isometrics.   EVAL: Patient was playing basketball in early March and the first incident of knee pain was running to block a ball and had an ultrasound and was told by the doctor that she inflamed her hamstring tendon. She kept playing and then in the same game she got a fast break and was fouled and she landed on the players foot and her knee went out to the side. She denies hearing any popping/clicking. She kept playing, but was limping. She continued to play in games, but did not practice until end of March. She worked with the PT while recovering, but didn't do a ton of strengthening. [place] and was referred to PT. The pain is located along the lateral hamstring. She reports there is  a "new sound" in the knee since her injury, but does not think it is popping/clicking. Initially she had instances of the knee giving away, but not recently. Since returning home she has been resting and relaxing with pilates being her only exercise currently.   Patient reports she is right handed and blocks a lot of shots. Her Rt shoulder has been bothering her a lot more this past year compared to previous years. It hurts to sleep on the right shoulder and feels weak when she tries to move it. She denies any popping/clicking or feelings of instability. She reports shoulder pain has been ongoing for past 5 years, but does recall blocking a shot in August  and started to have more shoulder pain after this.     PERTINENT HISTORY: ORIF Lt hand  Exercise induced asthma  PAIN:  Are you having pain? Yes: NPRS scale: 2  Pain location: Lt posterolateral knee; Rt anterior shoulder Pain description: dull, ache (knee); weak, sharp (shoulder) Aggravating factors: bending, prolonged walking, sleep (knee); overhead lifting,raising (shoulder) Relieving factors: ice,strengthening  PRECAUTIONS: None  RED FLAGS: None   WEIGHT BEARING RESTRICTIONS: No  FALLS:  Has patient fallen in last 6 months? Yes. Number of falls plays basketball (multiple falls)   LIVING ENVIRONMENT: Lives with: lives with their family Lives in: House/apartment Stairs: Yes: Internal: 15 steps; on right going up Has following equipment at home: None  OCCUPATION: professional basketball player; out of season for now  PLOF: Independent  PATIENT GOALS: "I want to be able to bend it." (Knee); "sleep on it." (Shoulder); main goal is return to basketball   NEXT MD VISIT: nothing scheduled   OBJECTIVE:  Note: Objective measures were completed at Evaluation unless otherwise noted.  DIAGNOSTIC FINDINGS:  Lt knee MRI: IMPRESSION: 1. Free edge fraying of the posterior horn and body of the lateral meniscus without discrete radial tear or displaced meniscal fragment. 2. The medial meniscus, cruciate and collateral ligaments are intact. 3. Prominent tricompartmental degenerative changes for age, most advanced in the patellofemoral and lateral compartments. No acute osseous findings. 4. Small knee joint effusion.  Rt Shoulder X-ray: IMPRESSION: Negative radiographs of the right shoulder.  PATIENT SURVEYS:  Patient-specific activity scoring scheme (Point to one number):  "0" represents "unable to perform." "10" represents "able to perform at prior level. 0 1 2 3 4 5 6 7 8 9  10 (Date and Score) Activity Initial  Activity Eval     Squatting   6    Sleeping on Rt side   0    Running 0   Jumping  0    Additional Additional Total score = sum of the activity scores/number of activities Minimum detectable change (90%CI) for average score = 2 points Minimum detectable change (90%CI) for single activity score = 3 points PSFS developed by: Jake Seats., & Binkley, J. (1995). Assessing disability and change on individual  patients: a report of a patient specific measure. Physiotherapy [place], [phone], [phone]. Reproduced with the permission of the authors  Score: 1.5 total    COGNITION: Overall cognitive status: Within functional limits for tasks assessed     SENSATION: Not tested  EDEMA:  No obvious swelling about the knee or shoulder   MUSCLE LENGTH: Hamstrings: Right lacking 2 deg; Left lacking 35 deg pain   POSTURE: No Significant postural limitations  PALPATION: TTP left lateral hamstring Did not palpate Rt shoulder   LOWER EXTREMITY ROM:  Active ROM Right eval Left eval  Hip flexion    Hip extension    Hip abduction    Hip adduction    Hip internal rotation    Hip external rotation    Knee flexion 135 119 pain  Knee extension Full  Full   Ankle dorsiflexion    Ankle plantarflexion    Ankle inversion    Ankle eversion     (Blank rows = not tested)  LOWER EXTREMITY MMT:  MMT Right eval Left eval  Hip flexion 4+ 4  Hip extension 4+ 4+  Hip abduction 4+ 4- pain   Hip adduction    Hip internal rotation    Hip external rotation    Knee flexion 5 4+ pain   Knee extension 4+ 4+ pain  Ankle dorsiflexion    Ankle plantarflexion    Ankle inversion    Ankle eversion     (Blank rows = not tested)  UPPER EXTREMITY ROM:  Active ROM Right eval Left eval  Shoulder flexion 150 180  Shoulder extension    Shoulder abduction 180 pain 180  Shoulder adduction    Shoulder extension    Shoulder internal rotation 65 85  Shoulder external rotation 90 90  Elbow flexion    Elbow extension    Wrist flexion     Wrist extension    Wrist ulnar deviation    Wrist radial deviation    Wrist pronation    Wrist supination     (Blank rows = not tested)   UPPER EXTREMITY MMT:  MMT Right eval Left eval  Shoulder flexion 4 5  Shoulder extension    Shoulder abduction 5 5  Shoulder adduction    Shoulder extension    Shoulder internal rotation 5 5  Shoulder external rotation 4- pain  4  Middle trapezius 4 pain 4+  Lower trapezius    Elbow flexion    Elbow extension    Wrist flexion    Wrist extension    Wrist ulnar deviation    Wrist radial deviation    Wrist pronation    Wrist supination    Grip strength     (Blank rows = not tested)  SPECIAL TESTS:  (+) Yergason's, Speeds, Neer's, Hawkin's Kennedy (-) Empty Can  (+) McMurray's for pain  (-) Anterior drawer, posterior drawer, valgus, varus   FUNCTIONAL TESTS:  Squat: WNL pain left lateral knee  GAIT: Distance walked: 10 ft  Assistive device utilized: None Level of assistance: Complete Independence Comments: no obvious gait abnormalities     [facility] Adult PT Treatment:                                                DATE: 12/30/23 Therapeutic Exercise: Sleeper stretch 2 x 30 sec  Pec stretch on roller x 1 minute HS isometric x 10; 5 sec hold; slight knee bend  HS isometric at 90 degrees x 10; 5 sec hold (PT assist through concentic/eccentric phase) Sidelying HS curl x 10 Manual Therapy: Rt GHJ A/P mobilization grade II-III Neuromuscular re-ed: Serratus punch on roller 2 x 15 @ 5 lbs  Rows at matrix 2 x 10 @ 20 lbs Prone hip extension 2 x 10  Sidelying hip abduction x 10 each  [facility] Adult PT Treatment:                                                DATE: 12/28/23 Therapeutic Exercise: Demonstrated,performed, and issued initial HEP.       PATIENT EDUCATION:  Education details: HEP update  Person  educated: Patient Education method: Explanation, Demonstration, Tactile cues, Verbal cues, and Handouts Education comprehension: verbalized understanding, returned demonstration, verbal cues required, tactile cues required, and needs further education  HOME EXERCISE PROGRAM: Access Code: [email or web address] URL: [email or web address] Date: 12/30/2023 Prepared by: Forrestine Ike  Exercises - Seated Hamstring Stretch  - 2 x daily - 7 x weekly - 3 sets - 30 sec  hold - Supine Isometric Hamstring Set  - 2 x daily - 7 x weekly - 2 sets - 10 reps - 5 sec  hold - Supine Static Chest Stretch on Foam Roll  - 2 x daily - 7 x weekly - 1 sets - 1 min hold - Sleeper Stretch  - 2 x daily - 7 x weekly - 3 sets - 30 sec  hold - Shoulder External Rotation and Scapular Retraction with Resistance  - 1 x daily - 7 x weekly - 2 sets - 10 reps - Supine Scapular Protraction in Flexion with Dumbbells  - 1 x daily - 7 x weekly - 2 sets - 15 reps - Seated Row Cable Machine  - 1 x daily - 7 x weekly - 3 sets - 10 reps - Prone Hip Extension  - 1 x daily - 7 x weekly - 3 sets - 10 reps - Sidelying Hip Abduction  - 1 x daily - 7 x weekly - 3 sets - 10 reps  ASSESSMENT:  CLINICAL IMPRESSION: Patient tolerated session well today focusing on periscapular strengthening and hamstring progression. She fatigued quickly with periscapular strengthening, but this does not exacerbate shoulder pain. Able to complete isometric hamstring strengthening with slight knee bend and at 90 degrees without hamstring pain, but requires PT assistance to achieve 90 degrees as pain is elicted with concentric and eccentric prone knee bend. With sidelying knee bend she had no pain initially in the hamstring, but noted pain at rep 8.   EVAL: Patient is a 28 y.o. female who was seen today for physical therapy evaluation and treatment for Lt knee pain and Rt shoulder pain.  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 Her Rt shoulder pain has been present for the past 5 years, but recently worsened in the fall with a blocking maneuver while playing basketball. In regards to the Lt knee she is noted to have palpable tenderness about Lt lateral hamstring and lateral joint line, pain and weakness with Lt knee MMT, and bilateral hip weakness. In regards to the Rt shoulder she has positive impingement and bicep special testing, limited shoulder flexion and IR AROM, Rt shoulder weakness, and periscapular weakness. Patient will benefit from skilled PT to address the above stated deficits in order to optimize their function and assist in overall pain reduction.    OBJECTIVE IMPAIRMENTS: decreased activity tolerance,  decreased balance, decreased endurance, decreased knowledge of condition, difficulty walking, decreased ROM, decreased strength, increased fascial restrictions, impaired flexibility, impaired UE functional use, improper body mechanics, and pain.   ACTIVITY LIMITATIONS: carrying, lifting, bending, standing, squatting, sleeping, stairs, reach over head, and locomotion level  PARTICIPATION LIMITATIONS: cleaning, laundry, shopping, community activity, and occupation  PERSONAL FACTORS: Age, Past/current experiences, Profession, and Time since onset of injury/illness/exacerbation are also affecting patient's functional outcome.   REHAB POTENTIAL: Good  CLINICAL DECISION MAKING: Evolving/moderate complexity  EVALUATION COMPLEXITY: Moderate   GOALS: Goals reviewed with patient? Yes  SHORT TERM GOALS: Target date: 01/25/2024   Patient will be independent and compliant with initial HEP.   Baseline: issued at eval Goal status: INITIAL  2.   Patient will demonstrate at least 130 degrees of pain free Lt knee flexion AROM to improve ability to complete squatting activity.  Baseline: see above Goal status: INITIAL  3.  Patient will demonstrate 180 degrees of Rt shoulder flexion AROM to improve ability to block in basketball Baseline: see above Goal status: INITIAL  4.  Patient will demonstrate at least 80 degrees of Rt shoulder IR AROM to improve ability to complete self-care activities.  Baseline: see above  Goal status: INITIAL    LONG TERM GOALS: Target date: 02/26/24  Patient will score >/= 5 on PSFS to signify clinically meaningful improvement in functional abilities.  Baseline:  Goal status: INITIAL  2.  Patient will squat without pain to improve ability to maintain defensive stance in basketball.  Baseline: Lt knee pain Goal status: INITIAL  3.  Patient will be able to run at least 10 minutes without onset of knee pain.  Baseline: unable Goal status: INITIAL  4.  Patient will demonstrate 5/5 LLE strength to improve stability with jumping.  Baseline: see above Goal status: INITIAL  5.  Patient will demonstrate 5/5 Rt shoulder strength to improve tolerance to basketball shooting/throwing.  Baseline: see above  Goal status: INITIAL   PLAN:  PT FREQUENCY: 2x/week  PT DURATION: 8 weeks  PLANNED INTERVENTIONS: [id number]- PT Re-evaluation, 97110-Therapeutic exercises, 97530- Therapeutic activity, [id number]- Neuromuscular re-education, 97535- Self Care, [id number]- Manual therapy, [id number]- Gait training, [phone]- Aquatic Therapy, Taping, Dry Needling, Cryotherapy, and Moist heat  PLAN FOR NEXT SESSION: review and progress HEP prn; progress hamstring strength (start with isometric,gravity minimized positioning) stretching as tolerated. Hip strengthening, shoulder/periscapular strengthening. Shoulder IR and flexion ROM.   Masyn Fullam, PT, DPT, ATC 12/30/23 12:41 PM

## 2024-01-03 ENCOUNTER — Ambulatory Visit

## 2024-01-03 DIAGNOSIS — M2392 Unspecified internal derangement of left knee: Secondary | ICD-10-CM | POA: Diagnosis not present

## 2024-01-03 DIAGNOSIS — M25511 Pain in right shoulder: Secondary | ICD-10-CM | POA: Diagnosis not present

## 2024-01-03 DIAGNOSIS — M25562 Pain in left knee: Secondary | ICD-10-CM | POA: Diagnosis not present

## 2024-01-03 DIAGNOSIS — M6281 Muscle weakness (generalized): Secondary | ICD-10-CM | POA: Diagnosis not present

## 2024-01-03 DIAGNOSIS — G8929 Other chronic pain: Secondary | ICD-10-CM

## 2024-01-03 NOTE — Therapy (Signed)
 OUTPATIENT PHYSICAL THERAPY TREATMENT   Patient Name: Donna Montgomery MRN: [id number] DOB:March 20, 1996, 28 y.o., female Today's Date: 01/03/2024  END OF SESSION:  PT End of Session - 01/03/24 1314     Visit Number 3    Number of Visits 17    Date for PT Re-Evaluation 02/26/24    Authorization Type [employer]    Authorization Time Period 4/15-6/13/25    Authorization - Visit Number 3    Authorization - Number of Visits 5    PT Start Time 1315    Activity Tolerance Patient tolerated treatment well    Behavior During Therapy [facility] for tasks assessed/performed             Past Medical History:  Diagnosis Date   Allergy    Anemia 2014   My blood tests in college said i had low iron and I was given medication. It has been low since.   Anxiety 2014   Had anxiety my whole life, but recognized in college by health professional.   Asthma    Depression 2014   Noticed symptoms in college, was put onto two different medicines. Stopped taking medication and seeked therapy. Symptoms come and go.   Ulcer 2013   Bleeding ulcer from H. Bacteria.   Past Surgical History:  Procedure Laterality Date   FRACTURE SURGERY  2017   Hand fracture   OPEN REDUCTION INTERNAL FIXATION (ORIF) HAND Left 2017   Patient Active Problem List   Diagnosis Date Noted   GAD (generalized anxiety disorder) 08/05/2023   Current moderate episode of major depressive disorder without prior episode (HCC) 08/05/2023   Acute pain of right shoulder 08/05/2023   Acute otitis externa of left ear 08/05/2023   Tinnitus of left ear 08/05/2023   Menorrhagia with regular cycle 10/24/2022   Iron deficiency anemia 10/24/2022   Depression, recurrent (HCC) 03/25/2020   Anxiety 03/25/2020   Chronic toe pain, right foot 07/27/2017   Asthma 08/29/2010   EXERCISE INDUCED ASTHMA 07/11/2010    PCP: Josepha Nickels, DO   REFERRING PROVIDER: Rodgers Clack, DO   REFERRING DIAG:  [phone] (ICD-10-CM) - Internal derangement  of left knee  M25.511,G89.29 (ICD-10-CM) - Chronic right shoulder pain    THERAPY DIAG:  Acute pain of left knee  Chronic right shoulder pain  Muscle weakness (generalized)  Rationale for Evaluation and Treatment: Rehabilitation  ONSET DATE: shoulder exacerbation August 2023; knee March 2024   SUBJECTIVE:   SUBJECTIVE STATEMENT:  Patient reports shoulder is feeling better; states she had a massage and everything felt fine after. Patient states she was walking a lot yesterday and her hamstring is feeling sore today.   EVAL: Patient was playing basketball in early March and the first incident of knee pain was running to block a ball and had an ultrasound and was told by the doctor that she inflamed her hamstring tendon. She kept playing and then in the same game she got a fast break and was fouled and she landed on the players foot and her knee went out to the side. She denies hearing any popping/clicking. She kept playing, but was limping. She continued to play in games, but did not practice until end of March. She worked with the PT while recovering, but didn't do a ton of strengthening. [place] and was referred to PT. The pain is located along the lateral hamstring. She reports there is a "new sound" in the knee  since her injury, but does not think it is popping/clicking. Initially she had instances of the knee giving away, but not recently. Since returning home she has been resting and relaxing with pilates being her only exercise currently.   Patient reports she is right handed and blocks a lot of shots. Her Rt shoulder has been bothering her a lot more this past year compared to previous years. It hurts to sleep on the right shoulder and feels weak when she tries to move it. She denies any popping/clicking or feelings of instability. She reports shoulder pain has been ongoing for past 5 years, but does recall blocking a shot in August and started to  have more shoulder pain after this.     PERTINENT HISTORY: ORIF Lt hand  Exercise induced asthma  PAIN:  Are you having pain? Yes: NPRS scale: 2  Pain location: Lt posterolateral knee; Rt anterior shoulder Pain description: dull, ache (knee); weak, sharp (shoulder) Aggravating factors: bending, prolonged walking, sleep (knee); overhead lifting,raising (shoulder) Relieving factors: ice,strengthening  PRECAUTIONS: None  RED FLAGS: None   WEIGHT BEARING RESTRICTIONS: No  FALLS:  Has patient fallen in last 6 months? Yes. Number of falls plays basketball (multiple falls)   LIVING ENVIRONMENT: Lives with: lives with their family Lives in: House/apartment Stairs: Yes: Internal: 15 steps; on right going up Has following equipment at home: None  OCCUPATION: professional basketball player; out of season for now  PLOF: Independent  PATIENT GOALS: "I want to be able to bend it." (Knee); "sleep on it." (Shoulder); main goal is return to basketball   NEXT MD VISIT: nothing scheduled   OBJECTIVE:  Note: Objective measures were completed at Evaluation unless otherwise noted.  DIAGNOSTIC FINDINGS:  Lt knee MRI: IMPRESSION: 1. Free edge fraying of the posterior horn and body of the lateral meniscus without discrete radial tear or displaced meniscal fragment. 2. The medial meniscus, cruciate and collateral ligaments are intact. 3. Prominent tricompartmental degenerative changes for age, most advanced in the patellofemoral and lateral compartments. No acute osseous findings. 4. Small knee joint effusion.  Rt Shoulder X-ray: IMPRESSION: Negative radiographs of the right shoulder.  PATIENT SURVEYS:  Patient-specific activity scoring scheme (Point to one number):  "0" represents "unable to perform." "10" represents "able to perform at prior level. 0 1 2 3 4 5 6 7 8 9  10 (Date and Score) Activity Initial  Activity Eval     Squatting   6    Sleeping on Rt side  0    Running  0   Jumping  0    Additional Additional Total score = sum of the activity scores/number of activities Minimum detectable change (90%CI) for average score = 2 points Minimum detectable change (90%CI) for single activity score = 3 points PSFS developed by: [place] Spitz., & Binkley, J. (1995). Assessing disability and change on individual  patients: a report of a patient specific measure. Physiotherapy [place], 47, 119-147. Reproduced with the permission of the authors  Score: 1.5 total    COGNITION: Overall cognitive status: Within functional limits for tasks assessed     SENSATION: Not tested  EDEMA:  No obvious swelling about the knee or shoulder   MUSCLE LENGTH: Hamstrings: Right lacking 2 deg; Left lacking 35 deg pain   POSTURE: No Significant postural limitations  PALPATION: TTP left lateral hamstring Did not palpate Rt shoulder   LOWER EXTREMITY ROM:  Active ROM Right eval Left eval  Hip flexion  Hip extension    Hip abduction    Hip adduction    Hip internal rotation    Hip external rotation    Knee flexion 135 119 pain  Knee extension Full  Full   Ankle dorsiflexion    Ankle plantarflexion    Ankle inversion    Ankle eversion     (Blank rows = not tested)  LOWER EXTREMITY MMT:  MMT Right eval Left eval  Hip flexion 4+ 4  Hip extension 4+ 4+  Hip abduction 4+ 4- pain   Hip adduction    Hip internal rotation    Hip external rotation    Knee flexion 5 4+ pain   Knee extension 4+ 4+ pain  Ankle dorsiflexion    Ankle plantarflexion    Ankle inversion    Ankle eversion     (Blank rows = not tested)  UPPER EXTREMITY ROM:  Active ROM Right eval Left eval  Shoulder flexion 150 180  Shoulder extension    Shoulder abduction 180 pain 180  Shoulder adduction    Shoulder extension    Shoulder internal rotation 65 85  Shoulder external rotation 90 90  Elbow flexion    Elbow extension    Wrist flexion    Wrist  extension    Wrist ulnar deviation    Wrist radial deviation    Wrist pronation    Wrist supination     (Blank rows = not tested)   UPPER EXTREMITY MMT:  MMT Right eval Left eval  Shoulder flexion 4 5  Shoulder extension    Shoulder abduction 5 5  Shoulder adduction    Shoulder extension    Shoulder internal rotation 5 5  Shoulder external rotation 4- pain  4  Middle trapezius 4 pain 4+  Lower trapezius    Elbow flexion    Elbow extension    Wrist flexion    Wrist extension    Wrist ulnar deviation    Wrist radial deviation    Wrist pronation    Wrist supination    Grip strength     (Blank rows = not tested)  SPECIAL TESTS:  (+) Yergason's, Speeds, Neer's, Hawkin's Kennedy (-) Empty Can  (+) McMurray's for pain  (-) Anterior drawer, posterior drawer, valgus, varus   FUNCTIONAL TESTS:  Squat: WNL pain left lateral knee  GAIT: Distance walked: 10 ft  Assistive device utilized: None Level of assistance: Complete Independence Comments: no obvious gait abnormalities    [facility] Adult PT Treatment:                                                DATE: 01/03/2024 Therapeutic Exercise: Side Lying:  HS curls x10, x5, x8, x6 Straight leg hip abd 2x15 Kicks forward/backward Standing hip extension x10 S/L sleeper stretch  Neuromuscular re-ed: Supine HS isometric with knee slightly bent 10x5" HS isometric at 90 degrees x 10; 5 sec hold (PT assist through concentic/eccentric phase) Supine on foam roller: Scap punches Angel arms (low)  Isometric row + Blue TB    [facility] Adult PT Treatment:                                                DATE: 12/30/23  Therapeutic Exercise: Sleeper stretch 2 x 30 sec  Pec stretch on roller x 1 minute HS isometric x 10; 5 sec hold; slight knee bend  HS isometric at 90 degrees x 10; 5 sec hold (PT assist through concentic/eccentric phase) Sidelying HS curl x 10 Manual Therapy: Rt GHJ A/P mobilization grade II-III Neuromuscular  re-ed: Serratus punch on roller 2 x 15 @ 5 lbs  Rows at matrix 2 x 10 @ 20 lbs Prone hip extension 2 x 10  Sidelying hip abduction x 10 each                                                                                                                              [facility] Adult PT Treatment:                                                DATE: 12/28/23 Therapeutic Exercise: Demonstrated, performed, and issued initial HEP.    PATIENT EDUCATION:  Education details: HEP update  Person educated: Patient Education method: Explanation, Demonstration, Tactile cues, Verbal cues, and Handouts Education comprehension: verbalized understanding, returned demonstration, verbal cues required, tactile cues required, and needs further education  HOME EXERCISE PROGRAM: Access Code: [email or web address] URL: [email or web address] Date: 12/30/2023 Prepared by: Forrestine Ike  Exercises - Seated Hamstring Stretch  - 2 x daily - 7 x weekly - 3 sets - 30 sec  hold - Supine Isometric Hamstring Set  - 2 x daily - 7 x weekly - 2 sets - 10 reps - 5 sec  hold - Supine Static Chest Stretch on Foam Roll  - 2 x daily - 7 x weekly - 1 sets - 1 min hold - Sleeper Stretch  - 2 x daily - 7 x weekly - 3 sets - 30 sec  hold - Shoulder External Rotation and Scapular Retraction with Resistance  - 1 x daily - 7 x weekly - 2 sets - 10 reps - Supine Scapular Protraction in Flexion with Dumbbells  - 1 x daily - 7 x weekly - 2 sets - 15 reps - Seated Row Cable Machine  - 1 x daily - 7 x weekly - 3 sets - 10 reps - Prone Hip Extension  - 1 x daily - 7 x weekly - 3 sets - 10 reps - Sidelying Hip Abduction  - 1 x daily - 7 x weekly - 3 sets - 10 reps  ASSESSMENT:  CLINICAL IMPRESSION: Hamstring isometrics and assisted ROM exercises continued to tolerance. Patient able to tolerate concentric phase of prone knee flexion, however continues to require assistance on eccentric phase due to discomfort. Thoracic extension in supine  added to address rounded forward posture; discussion with patient on performing seated version for modification with R shoulder. Noted rib flare with resisted rows.    EVAL: Patient is a  28 y.o. female who was seen today for physical therapy evaluation and treatment for Lt knee pain and Rt shoulder pain.  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 Her Rt shoulder pain has been present for the past 5 years, but recently worsened in the fall with a blocking maneuver while playing basketball. In regards to the Lt knee she is noted to have palpable tenderness about Lt lateral hamstring and lateral joint line, pain and weakness with Lt knee MMT, and bilateral hip weakness. In regards to the Rt shoulder she has positive impingement and bicep special testing, limited shoulder flexion and IR AROM, Rt shoulder weakness, and periscapular weakness. Patient will benefit from skilled PT to address the above stated deficits in order to optimize their function and assist in overall pain reduction.    OBJECTIVE IMPAIRMENTS: decreased activity tolerance, decreased balance, decreased endurance, decreased knowledge of condition, difficulty walking, decreased ROM, decreased strength, increased fascial restrictions, impaired flexibility, impaired UE functional use, improper body mechanics, and pain.   ACTIVITY LIMITATIONS: carrying, lifting, bending, standing, squatting, sleeping, stairs, reach over head, and locomotion level  PARTICIPATION LIMITATIONS: cleaning, laundry, shopping, community activity, and occupation  PERSONAL FACTORS: Age, Past/current experiences, Profession, and Time since onset of injury/illness/exacerbation  are also affecting patient's functional outcome.   REHAB POTENTIAL: Good  CLINICAL DECISION MAKING: Evolving/moderate complexity  EVALUATION COMPLEXITY: Moderate   GOALS: Goals reviewed with patient? Yes  SHORT TERM GOALS: Target date: 01/25/2024  Patient will be independent and compliant with initial HEP.  Baseline: issued at eval Goal status: INITIAL  2.  Patient will demonstrate at least 130 degrees of pain free Lt knee flexion AROM to improve ability to complete squatting activity.  Baseline: see above Goal status: INITIAL  3.  Patient will demonstrate 180 degrees of Rt shoulder flexion AROM to improve ability to block in basketball Baseline: see above Goal status: INITIAL  4.  Patient will demonstrate at least 80 degrees of Rt shoulder IR AROM to improve ability to complete self-care activities.  Baseline: see above  Goal status: INITIAL    LONG TERM GOALS: Target date: 02/26/24  Patient will score >/= 5 on PSFS to signify clinically meaningful improvement in functional abilities.  Baseline:  Goal status: INITIAL  2.  Patient will squat without pain to improve ability to maintain defensive stance in basketball.  Baseline: Lt knee pain Goal status: INITIAL  3.  Patient will be able to run at least 10 minutes without onset of knee pain.  Baseline: unable Goal status: INITIAL  4.  Patient will demonstrate 5/5 LLE strength to improve stability with jumping.  Baseline: see above Goal status: INITIAL  5.  Patient will demonstrate 5/5 Rt shoulder strength to improve tolerance to basketball shooting/throwing.  Baseline: see above  Goal status: INITIAL   PLAN:  PT FREQUENCY: 2x/week  PT DURATION: 8 weeks  PLANNED INTERVENTIONS: [id number]- PT Re-evaluation, 97110-Therapeutic exercises, 97530- Therapeutic activity, [id number]- Neuromuscular re-education, 97535- Self Care, [id number]- Manual therapy, [id number]- Gait training, [phone]- Aquatic Therapy, Taping, Dry Needling,  Cryotherapy, and Moist heat  PLAN FOR NEXT SESSION: review and progress HEP prn; progress hamstring strength (start with isometric,gravity minimized positioning) stretching as tolerated. Hip strengthening, shoulder/periscapular strengthening. Shoulder  IR and flexion ROM.   Sims Duck, PTA 01/03/24 1:15 PM

## 2024-01-05 ENCOUNTER — Ambulatory Visit

## 2024-01-05 DIAGNOSIS — M25511 Pain in right shoulder: Secondary | ICD-10-CM | POA: Diagnosis not present

## 2024-01-05 DIAGNOSIS — M25562 Pain in left knee: Secondary | ICD-10-CM

## 2024-01-05 DIAGNOSIS — M2392 Unspecified internal derangement of left knee: Secondary | ICD-10-CM | POA: Diagnosis not present

## 2024-01-05 DIAGNOSIS — G8929 Other chronic pain: Secondary | ICD-10-CM | POA: Diagnosis not present

## 2024-01-05 DIAGNOSIS — M6281 Muscle weakness (generalized): Secondary | ICD-10-CM

## 2024-01-05 NOTE — Patient Instructions (Signed)

## 2024-01-05 NOTE — Therapy (Signed)
 OUTPATIENT PHYSICAL THERAPY TREATMENT   Patient Name: SENNA LAPE MRN: [id number] DOB:20-Oct-1995, 28 y.o., female Today's Date: 01/05/2024  END OF SESSION:  PT End of Session - 01/05/24 1101     Visit Number 4    Number of Visits 17    Date for PT Re-Evaluation 02/26/24    Authorization Type [employer]    Authorization Time Period 4/15-6/13/25    Authorization - Visit Number 3    Authorization - Number of Visits 5    PT Start Time 1102    PT Stop Time 1155    PT Time Calculation (min) 53 min    Activity Tolerance Patient tolerated treatment well    Behavior During Therapy WFL for tasks assessed/performed              Past Medical History:  Diagnosis Date   Allergy    Anemia 2014   My blood tests in college said i had low iron and I was given medication. It has been low since.   Anxiety 2014   Had anxiety my whole life, but recognized in college by health professional.   Asthma    Depression 2014   Noticed symptoms in college, was put onto two different medicines. Stopped taking medication and seeked therapy. Symptoms come and go.   Ulcer 2013   Bleeding ulcer from H. Bacteria.   Past Surgical History:  Procedure Laterality Date   FRACTURE SURGERY  2017   Hand fracture   OPEN REDUCTION INTERNAL FIXATION (ORIF) HAND Left 2017   Patient Active Problem List   Diagnosis Date Noted   GAD (generalized anxiety disorder) 08/05/2023   Current moderate episode of major depressive disorder without prior episode (HCC) 08/05/2023   Acute pain of right shoulder 08/05/2023   Acute otitis externa of left ear 08/05/2023   Tinnitus of left ear 08/05/2023   Menorrhagia with regular cycle 10/24/2022   Iron deficiency anemia 10/24/2022   Depression, recurrent (HCC) 03/25/2020   Anxiety 03/25/2020   Chronic toe pain, right foot 07/27/2017   Asthma 08/29/2010   EXERCISE INDUCED ASTHMA 07/11/2010    PCP: Josepha Nickels, DO   REFERRING PROVIDER: Rodgers Clack, DO    REFERRING DIAG:  [phone] (ICD-10-CM) - Internal derangement of left knee  M25.511,G89.29 (ICD-10-CM) - Chronic right shoulder pain    THERAPY DIAG:  Acute pain of left knee  Chronic right shoulder pain  Muscle weakness (generalized)  Rationale for Evaluation and Treatment: Rehabilitation  ONSET DATE: shoulder exacerbation August 2023; knee March 2024   SUBJECTIVE:   SUBJECTIVE STATEMENT:  Hamstring is feeling better. Not feeling it with sleep and walking. Shoulder is hurting more this morning from sleep.   EVAL: Patient was playing basketball in early March and the first incident of knee pain was running to block a ball and had an ultrasound and was told by the doctor that she inflamed her hamstring tendon. She kept playing and then in the same game she got a fast break and was fouled and she landed on the players foot and her knee went out to the side. She denies hearing any popping/clicking. She kept playing, but was limping. She continued to play in games, but did not practice until end of March. She worked with the PT while recovering, but didn't do a ton of strengthening. [place] and was referred to PT. The pain is located along the lateral hamstring. She reports there is a "  new sound" in the knee since her injury, but does not think it is popping/clicking. Initially she had instances of the knee giving away, but not recently. Since returning home she has been resting and relaxing with pilates being her only exercise currently.   Patient reports she is right handed and blocks a lot of shots. Her Rt shoulder has been bothering her a lot more this past year compared to previous years. It hurts to sleep on the right shoulder and feels weak when she tries to move it. She denies any popping/clicking or feelings of instability. She reports shoulder pain has been ongoing for past 5 years, but does recall blocking a shot in August and started to have more  shoulder pain after this.     PERTINENT HISTORY: ORIF Lt hand  Exercise induced asthma  PAIN:  Are you having pain? Yes: NPRS scale: 4 Pain location: Rt lateral shoulder Pain description: sharp Aggravating factors:  overhead lifting,raising (shoulder) Relieving factors: ice,strengthening  PRECAUTIONS: None  RED FLAGS: None   WEIGHT BEARING RESTRICTIONS: No  FALLS:  Has patient fallen in last 6 months? Yes. Number of falls plays basketball (multiple falls)   LIVING ENVIRONMENT: Lives with: lives with their family Lives in: House/apartment Stairs: Yes: Internal: 15 steps; on right going up Has following equipment at home: None  OCCUPATION: professional basketball player; out of season for now  PLOF: Independent  PATIENT GOALS: "I want to be able to bend it." (Knee); "sleep on it." (Shoulder); main goal is return to basketball   NEXT MD VISIT: nothing scheduled   OBJECTIVE:  Note: Objective measures were completed at Evaluation unless otherwise noted.  DIAGNOSTIC FINDINGS:  Lt knee MRI: IMPRESSION: 1. Free edge fraying of the posterior horn and body of the lateral meniscus without discrete radial tear or displaced meniscal fragment. 2. The medial meniscus, cruciate and collateral ligaments are intact. 3. Prominent tricompartmental degenerative changes for age, most advanced in the patellofemoral and lateral compartments. No acute osseous findings. 4. Small knee joint effusion.  Rt Shoulder X-ray: IMPRESSION: Negative radiographs of the right shoulder.  PATIENT SURVEYS:  Patient-specific activity scoring scheme (Point to one number):  "0" represents "unable to perform." "10" represents "able to perform at prior level. 0 1 2 3 4 5 6 7 8 9  10 (Date and Score) Activity Initial  Activity Eval     Squatting   6    Sleeping on Rt side  0    Running 0   Jumping  0    Additional Additional Total score = sum of the activity scores/number of  activities Minimum detectable change (90%CI) for average score = 2 points Minimum detectable change (90%CI) for single activity score = 3 points PSFS developed by: [place] Spitz., & Binkley, J. (1995). Assessing disability and change on individual  patients: a report of a patient specific measure. Physiotherapy [place], 47, 119-147. Reproduced with the permission of the authors  Score: 1.5 total    COGNITION: Overall cognitive status: Within functional limits for tasks assessed     SENSATION: Not tested  EDEMA:  No obvious swelling about the knee or shoulder   MUSCLE LENGTH: Hamstrings: Right lacking 2 deg; Left lacking 35 deg pain   POSTURE: No Significant postural limitations  PALPATION: TTP left lateral hamstring Did not palpate Rt shoulder   LOWER EXTREMITY ROM:  Active ROM Right eval Left eval 01/05/24 Left   Hip flexion     Hip extension  Hip abduction     Hip adduction     Hip internal rotation     Hip external rotation     Knee flexion 135 119 pain 124 pain  Knee extension Full  Full    Ankle dorsiflexion     Ankle plantarflexion     Ankle inversion     Ankle eversion      (Blank rows = not tested)  LOWER EXTREMITY MMT:  MMT Right eval Left eval  Hip flexion 4+ 4  Hip extension 4+ 4+  Hip abduction 4+ 4- pain   Hip adduction    Hip internal rotation    Hip external rotation    Knee flexion 5 4+ pain   Knee extension 4+ 4+ pain  Ankle dorsiflexion    Ankle plantarflexion    Ankle inversion    Ankle eversion     (Blank rows = not tested)  UPPER EXTREMITY ROM:  Active ROM Right eval Left eval  Shoulder flexion 150 180  Shoulder extension    Shoulder abduction 180 pain 180  Shoulder adduction    Shoulder extension    Shoulder internal rotation 65 85  Shoulder external rotation 90 90  Elbow flexion    Elbow extension    Wrist flexion    Wrist extension    Wrist ulnar deviation    Wrist radial deviation     Wrist pronation    Wrist supination     (Blank rows = not tested)   UPPER EXTREMITY MMT:  MMT Right eval Left eval  Shoulder flexion 4 5  Shoulder extension    Shoulder abduction 5 5  Shoulder adduction    Shoulder extension    Shoulder internal rotation 5 5  Shoulder external rotation 4- pain  4  Middle trapezius 4 pain 4+  Lower trapezius    Elbow flexion    Elbow extension    Wrist flexion    Wrist extension    Wrist ulnar deviation    Wrist radial deviation    Wrist pronation    Wrist supination    Grip strength     (Blank rows = not tested)  SPECIAL TESTS:  (+) Yergason's, Speeds, Neer's, Hawkin's Kennedy (-) Empty Can  (+) McMurray's for pain  (-) Anterior drawer, posterior drawer, valgus, varus   FUNCTIONAL TESTS:  Squat: WNL pain left lateral knee  GAIT: Distance walked: 10 ft  Assistive device utilized: None Level of assistance: Complete Independence Comments: no obvious gait abnormalities   [facility] Adult PT Treatment:                                                DATE: 01/05/24 Therapeutic Exercise: Pec doorway stretch x 30 sec Prone shoulder extension 2 x 10  Manual Therapy: Skilled palpation of trigger points  Neuromuscular re-ed: Prone HS curl to 90 degrees 2 x 10  Reverse hyperextension 2 x 10  Hip bridge on physioball 2 x 10  Prone scapular retraction 2 x 10  Sidelying shoulder ER 1 x 10 @ 1 lb; 1 x 10 @ 2 lb Prone T d/c due to pain  Trigger Point Dry Needling  Initial Treatment: Pt instructed on Dry Needling rational, procedures, and possible side effects. Pt instructed to expect mild to moderate muscle soreness later in the day and/or into the next day.  Pt instructed  in methods to reduce muscle soreness. Pt instructed to continue prescribed HEP. Patient was educated on signs and symptoms of infection and other risk factors and advised to seek medical attention should they occur.  Patient verbalized understanding of these instructions  and education.   Patient Verbal Consent Given: Yes Education Handout Provided: Yes Muscles Treated: Rt deltoid  Electrical Stimulation Performed: No Treatment Response/Outcome: twitch response elicited    Modalities: Ice to Rt shoulder and Lt knee x 10 minutes    [facility] Adult PT Treatment:                                                DATE: 01/03/2024 Therapeutic Exercise: Side Lying:  HS curls x10, x5, x8, x6 Straight leg hip abd 2x15 Kicks forward/backward Standing hip extension x10 S/L sleeper stretch  Neuromuscular re-ed: Supine HS isometric with knee slightly bent 10x5" HS isometric at 90 degrees x 10; 5 sec hold (PT assist through concentic/eccentric phase) Supine on foam roller: Scap punches Angel arms (low)  Isometric row + Blue TB    [facility] Adult PT Treatment:                                                DATE: 12/30/23 Therapeutic Exercise: Sleeper stretch 2 x 30 sec  Pec stretch on roller x 1 minute HS isometric x 10; 5 sec hold; slight knee bend  HS isometric at 90 degrees x 10; 5 sec hold (PT assist through concentic/eccentric phase) Sidelying HS curl x 10 Manual Therapy: Rt GHJ A/P mobilization grade II-III Neuromuscular re-ed: Serratus punch on roller 2 x 15 @ 5 lbs  Rows at matrix 2 x 10 @ 20 lbs Prone hip extension 2 x 10  Sidelying hip abduction x 10 each                            PATIENT EDUCATION:  Education details: HEP update  Person educated: Patient Education method: Explanation, Demonstration, Tactile cues, Verbal cues, and Handouts Education comprehension: verbalized understanding, returned demonstration, verbal cues required, tactile cues required, and needs further education  HOME EXERCISE PROGRAM: Access Code: [email or web address] URL: [email or web address] Date: 01/05/2024 Prepared by: Forrestine Ike  Exercises - Seated Hamstring Stretch  - 2 x daily - 7 x weekly - 3 sets - 30 sec  hold - Supine Isometric Hamstring Set  - 2 x  daily - 7 x weekly - 2 sets - 10 reps - 5 sec  hold - Supine Static Chest Stretch on Foam Roll  - 2 x daily - 7 x weekly - 1 sets - 1 min hold - Sleeper Stretch  - 2 x daily - 7 x weekly - 3 sets - 30 sec  hold - Supine Scapular Protraction in Flexion with Dumbbells  - 1 x daily - 7 x weekly - 2 sets - 15 reps - Seated Row Cable Machine  - 1 x daily - 7 x weekly - 3 sets - 10 reps - Sidelying Hip Abduction  - 1 x daily - 7 x weekly - 3 sets - 10 reps - Prone Knee Flexion  - 1 x daily - 7 x weekly - 2  sets - 10 reps - Bridge with Heels on [employer]  - 1 x daily - 7 x weekly - 2 sets - 10 reps - Sidelying Shoulder ER with Towel and Dumbbell  - 1 x daily - 7 x weekly - 2 sets - 10 reps  ASSESSMENT:  CLINICAL IMPRESSION: Able to complete concentric and eccentric phase of prone knee flexion AROM to 90 degrees without hamstring pain. Above 90 degrees does elicit hamstring pain, so avoided these ranges. Her knee flexion AROM has improved compared to evaluation, but remains limited by pain. She has significant tautness and palpable tenderness about deltoid musculature. TPDN performed with excellent twitch response elicited with patient reporting soreness post-intervention as expected. Focused on progression of periscapular strengthening with fairly good tolerance with exception of prone T as this caused anterior shoulder pain and was discontinued.    EVAL: Patient is a 28 y.o. female who was seen today for physical therapy evaluation and treatment for Lt knee pain and Rt shoulder pain.  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 Her Rt shoulder pain has been present for the past 5 years, but recently worsened  in the fall with a blocking maneuver while playing basketball. In regards to the Lt knee she is noted to have palpable tenderness about Lt lateral hamstring and lateral joint line, pain and weakness with Lt knee MMT, and bilateral hip weakness. In regards to the Rt shoulder she has positive impingement and bicep special testing, limited shoulder flexion and IR AROM, Rt shoulder weakness, and periscapular weakness. Patient will benefit from skilled PT to address the above stated deficits in order to optimize their function and assist in overall pain reduction.    OBJECTIVE IMPAIRMENTS: decreased activity tolerance, decreased balance, decreased endurance, decreased knowledge of condition, difficulty walking, decreased ROM, decreased strength, increased fascial restrictions, impaired flexibility, impaired UE functional use, improper body mechanics, and pain.   ACTIVITY LIMITATIONS: carrying, lifting, bending, standing, squatting, sleeping, stairs, reach over head, and locomotion level  PARTICIPATION LIMITATIONS: cleaning, laundry, shopping, community activity, and occupation  PERSONAL FACTORS: Age, Past/current experiences, Profession, and Time since onset of injury/illness/exacerbation are also affecting patient's functional outcome.   REHAB POTENTIAL: Good  CLINICAL DECISION MAKING: Evolving/moderate complexity  EVALUATION COMPLEXITY: Moderate   GOALS: Goals reviewed with patient? Yes  SHORT TERM GOALS: Target date: 01/25/2024  Patient will be independent and compliant with initial HEP.  Baseline: issued at eval Goal status: INITIAL  2.  Patient will demonstrate at least 130 degrees of pain free Lt knee flexion AROM to improve ability to complete squatting activity.  Baseline: see above Goal status: INITIAL  3.  Patient will demonstrate 180 degrees of Rt shoulder flexion AROM to improve ability to block in basketball Baseline: see above Goal status: INITIAL  4.  Patient will  demonstrate at least 80 degrees of Rt shoulder IR AROM to improve ability to complete self-care activities.  Baseline: see above  Goal status: INITIAL    LONG TERM GOALS: Target date: 02/26/24  Patient will score >/= 5 on PSFS to signify clinically meaningful improvement in functional abilities.  Baseline:  Goal status: INITIAL  2.  Patient will squat without pain to improve ability to maintain  defensive stance in basketball.  Baseline: Lt knee pain Goal status: INITIAL  3.  Patient will be able to run at least 10 minutes without onset of knee pain.  Baseline: unable Goal status: INITIAL  4.  Patient will demonstrate 5/5 LLE strength to improve stability with jumping.  Baseline: see above Goal status: INITIAL  5.  Patient will demonstrate 5/5 Rt shoulder strength to improve tolerance to basketball shooting/throwing.  Baseline: see above  Goal status: INITIAL   PLAN:  PT FREQUENCY: 2x/week  PT DURATION: 8 weeks  PLANNED INTERVENTIONS: [id number]- PT Re-evaluation, 97110-Therapeutic exercises, 97530- Therapeutic activity, [id number]- Neuromuscular re-education, 97535- Self Care, [id number]- Manual therapy, [id number]- Gait training, [phone]- Aquatic Therapy, Taping, Dry Needling, Cryotherapy, and Moist heat  PLAN FOR NEXT SESSION: review and progress HEP prn; progress hamstring strength (progress concentric/eccentric ROM in pain free range) stretching as tolerated. Hip strengthening, shoulder/periscapular strengthening. Shoulder IR and flexion ROM. TPDN response    Kenyotta Dorfman, PT, DPT, ATC 01/05/24 12:31 PM

## 2024-01-07 LAB — GENECONNECT MOLECULAR SCREEN: Genetic Analysis Overall Interpretation: NEGATIVE

## 2024-01-10 ENCOUNTER — Ambulatory Visit

## 2024-01-10 DIAGNOSIS — M6281 Muscle weakness (generalized): Secondary | ICD-10-CM

## 2024-01-10 DIAGNOSIS — M25562 Pain in left knee: Secondary | ICD-10-CM | POA: Diagnosis not present

## 2024-01-10 DIAGNOSIS — G8929 Other chronic pain: Secondary | ICD-10-CM | POA: Diagnosis not present

## 2024-01-10 DIAGNOSIS — F411 Generalized anxiety disorder: Secondary | ICD-10-CM | POA: Diagnosis not present

## 2024-01-10 DIAGNOSIS — M25511 Pain in right shoulder: Secondary | ICD-10-CM | POA: Diagnosis not present

## 2024-01-10 DIAGNOSIS — M2392 Unspecified internal derangement of left knee: Secondary | ICD-10-CM | POA: Diagnosis not present

## 2024-01-10 NOTE — Therapy (Signed)
 OUTPATIENT PHYSICAL THERAPY TREATMENT   Patient Name: CARLETA BOURDO MRN: [id number] DOB:[birth year], 28 y.o., female Today's Date: 01/10/2024  END OF SESSION:  PT End of Session - 01/10/24 1531     Visit Number 5    Number of Visits 17    Date for PT Re-Evaluation 02/26/24    Authorization Type [employer]    Authorization Time Period 4/15-6/13/25    Authorization - Visit Number 4    Authorization - Number of Visits 5    PT Start Time 1530    PT Stop Time 1625    PT Time Calculation (min) 55 min    Activity Tolerance Patient tolerated treatment well    Behavior During Therapy WFL for tasks assessed/performed               Past Medical History:  Diagnosis Date   Allergy    Anemia 2014   My blood tests in college said i had low iron and I was given medication. It has been low since.   Anxiety 2014   Had anxiety my whole life, but recognized in college by health professional.   Asthma    Depression 2014   Noticed symptoms in college, was put onto two different medicines. Stopped taking medication and seeked therapy. Symptoms come and go.   Ulcer 2013   Bleeding ulcer from H. Bacteria.   Past Surgical History:  Procedure Laterality Date   FRACTURE SURGERY  2017   Hand fracture   OPEN REDUCTION INTERNAL FIXATION (ORIF) HAND Left 2017   Patient Active Problem List   Diagnosis Date Noted   GAD (generalized anxiety disorder) 08/05/2023   Current moderate episode of major depressive disorder without prior episode (HCC) 08/05/2023   Acute pain of right shoulder 08/05/2023   Acute otitis externa of left ear 08/05/2023   Tinnitus of left ear 08/05/2023   Menorrhagia with regular cycle 10/24/2022   Iron deficiency anemia 10/24/2022   Depression, recurrent (HCC) 03/25/2020   Anxiety 03/25/2020   Chronic toe pain, right foot 07/27/2017   Asthma 08/29/2010   EXERCISE INDUCED ASTHMA 07/11/2010    PCP: Josepha Nickels, DO   REFERRING PROVIDER: Rodgers Clack, DO    REFERRING DIAG:  [phone] (ICD-10-CM) - Internal derangement of left knee  M25.511,G89.29 (ICD-10-CM) - Chronic right shoulder pain    THERAPY DIAG:  Acute pain of left knee  Chronic right shoulder pain  Muscle weakness (generalized)  Rationale for Evaluation and Treatment: Rehabilitation  ONSET DATE: shoulder exacerbation August 2023; knee March 2024   SUBJECTIVE:   SUBJECTIVE STATEMENT:  Patient had a setback yesterday where she tripped and caused more pain in the hamstring. The shoulder was better after last visit, but then the pain returned yesterday.   EVAL: Patient was playing basketball in early March and the first incident of knee pain was running to block a ball and had an ultrasound and was told by the doctor that she inflamed her hamstring tendon. She kept playing and then in the same game she got a fast break and was fouled and she landed on the players foot and her knee went out to the side. She denies hearing any popping/clicking. She kept playing, but was limping. She continued to play in games, but did not practice until end of March. She worked with the PT while recovering, but didn't do a ton of strengthening. [place] and was referred to PT. The pain is  located along the lateral hamstring. She reports there is a "new sound" in the knee since her injury, but does not think it is popping/clicking. Initially she had instances of the knee giving away, but not recently. Since returning home she has been resting and relaxing with pilates being her only exercise currently.   Patient reports she is right handed and blocks a lot of shots. Her Rt shoulder has been bothering her a lot more this past year compared to previous years. It hurts to sleep on the right shoulder and feels weak when she tries to move it. She denies any popping/clicking or feelings of instability. She reports shoulder pain has been ongoing for past 5 years, but does recall  blocking a shot in August and started to have more shoulder pain after this.     PERTINENT HISTORY: ORIF Lt hand  Exercise induced asthma  PAIN:  Are you having pain? Yes: NPRS scale: 2 (hamstring); R (shoulder)  Pain location: Rt lateral shoulder; Lt hamstring Pain description: dull ache  Aggravating factors:  overhead lifting,raising (shoulder), excessive stretch/movement (hamstring)  Relieving factors: ice,strengthening  PRECAUTIONS: None  RED FLAGS: None   WEIGHT BEARING RESTRICTIONS: No  FALLS:  Has patient fallen in last 6 months? Yes. Number of falls plays basketball (multiple falls)   LIVING ENVIRONMENT: Lives with: lives with their family Lives in: House/apartment Stairs: Yes: Internal: 15 steps; on right going up Has following equipment at home: None  OCCUPATION: professional basketball player; out of season for now  PLOF: Independent  PATIENT GOALS: "I want to be able to bend it." (Knee); "sleep on it." (Shoulder); main goal is return to basketball   NEXT MD VISIT: nothing scheduled   OBJECTIVE:  Note: Objective measures were completed at Evaluation unless otherwise noted.  DIAGNOSTIC FINDINGS:  Lt knee MRI: IMPRESSION: 1. Free edge fraying of the posterior horn and body of the lateral meniscus without discrete radial tear or displaced meniscal fragment. 2. The medial meniscus, cruciate and collateral ligaments are intact. 3. Prominent tricompartmental degenerative changes for age, most advanced in the patellofemoral and lateral compartments. No acute osseous findings. 4. Small knee joint effusion.  Rt Shoulder X-ray: IMPRESSION: Negative radiographs of the right shoulder.  PATIENT SURVEYS:  Patient-specific activity scoring scheme (Point to one number):  "0" represents "unable to perform." "10" represents "able to perform at prior level. 0 1 2 3 4 5 6 7 8 9  10 (Date and Score) Activity Initial  Activity Eval     Squatting   6    Sleeping  on Rt side  0    Running 0   Jumping  0    Additional Additional Total score = sum of the activity scores/number of activities Minimum detectable change (90%CI) for average score = 2 points Minimum detectable change (90%CI) for single activity score = 3 points PSFS developed by: [place] Spitz., & Binkley, J. (1995). Assessing disability and change on individual  patients: a report of a patient specific measure. Physiotherapy [place], 47, [phone]. Reproduced with the permission of the authors  Score: 1.5 total    COGNITION: Overall cognitive status: Within functional limits for tasks assessed     SENSATION: Not tested  EDEMA:  No obvious swelling about the knee or shoulder   MUSCLE LENGTH: Hamstrings: Right lacking 2 deg; Left lacking 35 deg pain   POSTURE: No Significant postural limitations  PALPATION: TTP left lateral hamstring Did not palpate Rt shoulder   LOWER EXTREMITY  ROM:  Active ROM Right eval Left eval 01/05/24 Left   Hip flexion     Hip extension     Hip abduction     Hip adduction     Hip internal rotation     Hip external rotation     Knee flexion 135 119 pain 124 pain  Knee extension Full  Full    Ankle dorsiflexion     Ankle plantarflexion     Ankle inversion     Ankle eversion      (Blank rows = not tested)  LOWER EXTREMITY MMT:  MMT Right eval Left eval  Hip flexion 4+ 4  Hip extension 4+ 4+  Hip abduction 4+ 4- pain   Hip adduction    Hip internal rotation    Hip external rotation    Knee flexion 5 4+ pain   Knee extension 4+ 4+ pain  Ankle dorsiflexion    Ankle plantarflexion    Ankle inversion    Ankle eversion     (Blank rows = not tested)  UPPER EXTREMITY ROM:  Active ROM Right eval Left eval  Shoulder flexion 150 180  Shoulder extension    Shoulder abduction 180 pain 180  Shoulder adduction    Shoulder extension    Shoulder internal rotation 65 85  Shoulder external rotation 90 90  Elbow  flexion    Elbow extension    Wrist flexion    Wrist extension    Wrist ulnar deviation    Wrist radial deviation    Wrist pronation    Wrist supination     (Blank rows = not tested)   UPPER EXTREMITY MMT:  MMT Right eval Left eval  Shoulder flexion 4 5  Shoulder extension    Shoulder abduction 5 5  Shoulder adduction    Shoulder extension    Shoulder internal rotation 5 5  Shoulder external rotation 4- pain  4  Middle trapezius 4 pain 4+  Lower trapezius    Elbow flexion    Elbow extension    Wrist flexion    Wrist extension    Wrist ulnar deviation    Wrist radial deviation    Wrist pronation    Wrist supination    Grip strength     (Blank rows = not tested)  SPECIAL TESTS:  (+) Yergason's, Speeds, Neer's, Hawkin's Kennedy (-) Empty Can  (+) McMurray's for pain  (-) Anterior drawer, posterior drawer, valgus, varus   FUNCTIONAL TESTS:  Squat: WNL pain left lateral knee  GAIT: Distance walked: 10 ft  Assistive device utilized: None Level of assistance: Complete Independence Comments: no obvious gait abnormalities  [facility] Adult PT Treatment:                                                DATE: 01/10/24 Therapeutic Exercise: Pec doorway stretch x 1 minute  Manual Therapy: Skilled palpation of trigger points  K-tape postural corrective: I strip O to I upper trap x 50% tension, I strip coracoid process to medial scapular border 50% tension, I strip coracoid process to inferior scapular angle x 50% tension  Demo and returned demo of use of tennis ball for self-soft tissue mobilization.  Neuromuscular re-ed: Prone HS curl 2 x 10  Seated resisted HS curl yellow band 2 x 10  Resisted shoulder extension 2 x 15, green band  Reactive ER/IR isometrics x 10 each  Therapeutic Activity: Wall squat 2 x 10  Modalities: Ice to Rt shoulder and Lt knee x 10 minutes  Trigger Point Dry Needling  Subsequent Treatment: Instructions provided previously at initial dry needling  treatment.   Patient Verbal Consent Given: Yes Education Handout Provided: Previously Provided Muscles Treated: Rt middle deltoid  Electrical Stimulation Performed: No Treatment Response/Outcome: twitch response elicited     [facility] Adult PT Treatment:                                                DATE: 01/05/24 Therapeutic Exercise: Pec doorway stretch x 30 sec Prone shoulder extension 2 x 10  Manual Therapy: Skilled palpation of trigger points  Neuromuscular re-ed: Prone HS curl to 90 degrees 2 x 10  Reverse hyperextension 2 x 10  Hip bridge on physioball 2 x 10  Prone scapular retraction 2 x 10  Sidelying shoulder ER 1 x 10 @ 1 lb; 1 x 10 @ 2 lb Prone T d/c due to pain  Trigger Point Dry Needling  Initial Treatment: Pt instructed on Dry Needling rational, procedures, and possible side effects. Pt instructed to expect mild to moderate muscle soreness later in the day and/or into the next day.  Pt instructed in methods to reduce muscle soreness. Pt instructed to continue prescribed HEP. Patient was educated on signs and symptoms of infection and other risk factors and advised to seek medical attention should they occur.  Patient verbalized understanding of these instructions and education.   Patient Verbal Consent Given: Yes Education Handout Provided: Yes Muscles Treated: Rt deltoid  Electrical Stimulation Performed: No Treatment Response/Outcome: twitch response elicited    Modalities: Ice to Rt shoulder and Lt knee x 10 minutes    [facility] Adult PT Treatment:                                                DATE: 01/03/2024 Therapeutic Exercise: Side Lying:  HS curls x10, x5, x8, x6 Straight leg hip abd 2x15 Kicks forward/backward Standing hip extension x10 S/L sleeper stretch  Neuromuscular re-ed: Supine HS isometric with knee slightly bent 10x5" HS isometric at 90 degrees x 10; 5 sec hold (PT assist through concentic/eccentric phase) Supine on foam roller: Scap  punches Angel arms (low)  Isometric row + Blue TB   PATIENT EDUCATION:  Education details: HEP update  Person educated: Patient Education method: Programmer, multimedia, Demonstration, Actor cues, Verbal cues, and Handouts Education comprehension: verbalized understanding, returned demonstration, verbal cues required, tactile cues required, and needs further education  HOME EXERCISE PROGRAM: Access Code: [email or web address] URL: [email or web address] Date: 01/10/2024 Prepared by: Forrestine Ike  Exercises - Seated Hamstring Stretch  - 2 x daily - 7 x weekly - 3 sets - 30 sec  hold - Supine Static Chest Stretch on Foam Roll  - 2 x daily - 7 x weekly - 1 sets - 1 min hold - Sleeper Stretch  - 2 x daily - 7 x weekly - 3 sets - 30 sec  hold - Supine Scapular Protraction in Flexion with Dumbbells  - 1 x daily - 7 x weekly - 2 sets - 15 reps - Seated Row [employer]  -  1 x daily - 7 x weekly - 3 sets - 10 reps - Sidelying Hip Abduction  - 1 x daily - 7 x weekly - 3 sets - 10 reps - Prone Knee Flexion  - 1 x daily - 7 x weekly - 2 sets - 10 reps - Bridge with Heels on [employer]  - 1 x daily - 7 x weekly - 2 sets - 10 reps - Sidelying Shoulder ER with Towel and Dumbbell  - 1 x daily - 7 x weekly - 2 sets - 10 reps - Seated Hamstring Curl with Anchored Resistance  - 1 x daily - 7 x weekly - 2 sets - 10 reps - Wall Squat  - 1 x daily - 7 x weekly - 2 sets - 10 reps - Shoulder External Rotation Reactive Isometrics  - 1 x daily - 7 x weekly - 2 sets - 10 reps - Shoulder Internal Rotation Reactive Isometrics  - 1 x daily - 7 x weekly - 2 sets - 10 reps  ASSESSMENT:  CLINICAL IMPRESSION: Able to initiate light resisted hamstring strengthening today without onset of pain. She demonstrates good form with wall squat without onset of hamstring pain. Continues to have tautness about deltoid with excellent twitch response elicited with TPDN today. With resisted shoulder extension she had pain about the  lateral shoulder at end range of flexion and extension of the shoulder, but when told to reduce range she was able to perform without pain. With reactive IR isometric she felt sharp pain localized to supraspinatus during last rep that subsided with rest. She was noted to have tautness and palpable tenderness about posterior rotator cuff, so educated on use of tennis ball for self-soft tissue mobilization. She does exhibit forward shoulder posturing and scapular winging, so complete K-tape postural corrective today to determine any effects on shoulder pain.    EVAL: Patient is a 28 y.o. female who was seen today for physical therapy evaluation and treatment for Lt knee pain and Rt shoulder pain.  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 Her Rt shoulder pain has been present for the past 5 years, but recently worsened in the fall with a blocking maneuver while playing basketball. In regards to the Lt knee she is noted to have palpable tenderness about Lt lateral hamstring and lateral joint line, pain and weakness with Lt knee MMT, and bilateral hip weakness. In regards to the Rt shoulder she has positive impingement and bicep special testing, limited shoulder flexion and IR AROM, Rt shoulder weakness, and periscapular weakness. Patient will benefit from skilled PT to address the above stated deficits in order to optimize their function and assist in overall pain reduction.    OBJECTIVE IMPAIRMENTS: decreased activity tolerance, decreased balance, decreased endurance, decreased knowledge of condition, difficulty walking, decreased ROM, decreased strength, increased fascial restrictions, impaired flexibility,  impaired UE functional use, improper body mechanics, and pain.   ACTIVITY LIMITATIONS: carrying, lifting, bending, standing, squatting, sleeping, stairs, reach over head, and locomotion level  PARTICIPATION LIMITATIONS: cleaning, laundry, shopping, community activity, and occupation  PERSONAL FACTORS: Age, Past/current experiences, Profession, and Time since onset of injury/illness/exacerbation are also affecting patient's functional outcome.   REHAB POTENTIAL: Good  CLINICAL DECISION MAKING: Evolving/moderate complexity  EVALUATION COMPLEXITY: Moderate   GOALS: Goals reviewed with patient? Yes  SHORT TERM GOALS: Target date: 01/25/2024  Patient will be independent and compliant with initial HEP.  Baseline: issued at eval Goal status: INITIAL  2.  Patient will demonstrate at least 130 degrees of pain free Lt knee flexion AROM to improve ability to complete squatting activity.  Baseline: see above Goal status: INITIAL  3.  Patient will demonstrate 180 degrees of Rt shoulder flexion AROM to improve ability to block in basketball Baseline: see above Goal status: INITIAL  4.  Patient will demonstrate at least 80 degrees of Rt shoulder IR AROM to improve ability to complete self-care activities.  Baseline: see above  Goal status: INITIAL    LONG TERM GOALS: Target date: 02/26/24  Patient will score >/= 5 on PSFS to signify clinically meaningful improvement in functional abilities.  Baseline:  Goal status: INITIAL  2.  Patient will squat without pain to improve ability to maintain defensive stance in basketball.  Baseline: Lt knee pain Goal status: INITIAL  3.  Patient will be able to run at least 10 minutes without onset of knee pain.  Baseline: unable Goal status: INITIAL  4.  Patient will demonstrate 5/5 LLE strength to improve stability with jumping.  Baseline: see above Goal status: INITIAL  5.  Patient will demonstrate 5/5 Rt shoulder strength to improve  tolerance to basketball shooting/throwing.  Baseline: see above  Goal status: INITIAL   PLAN:  PT FREQUENCY: 2x/week  PT DURATION: 8 weeks  PLANNED INTERVENTIONS: [id number]- PT Re-evaluation, 97110-Therapeutic exercises, 97530- Therapeutic activity, [id number]- Neuromuscular re-education, 97535- Self Care, 16109- Manual therapy, [id number]- Gait training, [phone]- Aquatic Therapy, Taping, Dry Needling, Cryotherapy, and Moist heat  PLAN FOR NEXT SESSION: review and progress HEP prn; progress hamstring strength and stretching as tolerated. Hip strengthening, shoulder/periscapular strengthening. Shoulder IR and flexion ROM. TPDN response; will need to request more auth    Latamara Melder, PT, DPT, ATC 01/10/24 4:26 PM

## 2024-01-11 DIAGNOSIS — Z0120 Encounter for dental examination and cleaning without abnormal findings: Secondary | ICD-10-CM | POA: Diagnosis not present

## 2024-01-11 DIAGNOSIS — K029 Dental caries, unspecified: Secondary | ICD-10-CM | POA: Diagnosis not present

## 2024-01-13 ENCOUNTER — Ambulatory Visit: Attending: Family Medicine

## 2024-01-13 DIAGNOSIS — M6281 Muscle weakness (generalized): Secondary | ICD-10-CM | POA: Diagnosis present

## 2024-01-13 DIAGNOSIS — M25511 Pain in right shoulder: Secondary | ICD-10-CM | POA: Insufficient documentation

## 2024-01-13 DIAGNOSIS — M25562 Pain in left knee: Secondary | ICD-10-CM

## 2024-01-13 DIAGNOSIS — G8929 Other chronic pain: Secondary | ICD-10-CM

## 2024-01-13 NOTE — Therapy (Signed)
 OUTPATIENT PHYSICAL THERAPY TREATMENT   Patient Name: Donna Montgomery MRN: [id number] DOB:1995/11/30, 28 y.o., female Today's Date: 01/13/2024  END OF SESSION:  PT End of Session - 01/13/24 1403     Visit Number 6    Number of Visits 17    Date for PT Re-Evaluation 02/26/24    Authorization Type [employer]    Authorization Time Period 4/15-6/13/25    Authorization - Visit Number 5    Authorization - Number of Visits 5    PT Start Time 1403    PT Stop Time 1459    PT Time Calculation (min) 56 min    Activity Tolerance Patient tolerated treatment well    Behavior During Therapy [facility] for tasks assessed/performed                Past Medical History:  Diagnosis Date   Allergy    Anemia 2014   My blood tests in college said i had low iron and I was given medication. It has been low since.   Anxiety 2014   Had anxiety my whole life, but recognized in college by health professional.   Asthma    Depression 2014   Noticed symptoms in college, was put onto two different medicines. Stopped taking medication and seeked therapy. Symptoms come and go.   Ulcer 2013   Bleeding ulcer from H. Bacteria.   Past Surgical History:  Procedure Laterality Date   FRACTURE SURGERY  2017   Hand fracture   OPEN REDUCTION INTERNAL FIXATION (ORIF) HAND Left 2017   Patient Active Problem List   Diagnosis Date Noted   GAD (generalized anxiety disorder) 08/05/2023   Current moderate episode of major depressive disorder without prior episode (HCC) 08/05/2023   Acute pain of right shoulder 08/05/2023   Acute otitis externa of left ear 08/05/2023   Tinnitus of left ear 08/05/2023   Menorrhagia with regular cycle 10/24/2022   Iron deficiency anemia 10/24/2022   Depression, recurrent (HCC) 03/25/2020   Anxiety 03/25/2020   Chronic toe pain, right foot 07/27/2017   Asthma 08/29/2010   EXERCISE INDUCED ASTHMA 07/11/2010    PCP: Josepha Nickels, DO   REFERRING PROVIDER: Rodgers Clack, DO    REFERRING DIAG:  [phone] (ICD-10-CM) - Internal derangement of left knee  M25.511,G89.29 (ICD-10-CM) - Chronic right shoulder pain    THERAPY DIAG:  Acute pain of left knee  Chronic right shoulder pain  Muscle weakness (generalized)  Rationale for Evaluation and Treatment: Rehabilitation  ONSET DATE: shoulder exacerbation August 2023; knee March 2024   SUBJECTIVE:   SUBJECTIVE STATEMENT:  Patient reports the hamstring feels ok today, but took her dog on a walk on Tuesday and this caused knee pain that caused her to stop walking. Feels that the tape has been helpful for her shoulder pain, but still feels it.   EVAL: Patient was playing basketball in early March and the first incident of knee pain was running to block a ball and had an ultrasound and was told by the doctor that she inflamed her hamstring tendon. She kept playing and then in the same game she got a fast break and was fouled and she landed on the players foot and her knee went out to the side. She denies hearing any popping/clicking. She kept playing, but was limping. She continued to play in games, but did not practice until end of March. She worked with the PT while recovering, but didn't do a ton of strengthening. [place] and was referred to PT. The pain is located along the lateral hamstring. She reports there is a "new sound" in the knee since her injury, but does not think it is popping/clicking. Initially she had instances of the knee giving away, but not recently. Since returning home she has been resting and relaxing with pilates being her only exercise currently.   Patient reports she is right handed and blocks a lot of shots. Her Rt shoulder has been bothering her a lot more this past year compared to previous years. It hurts to sleep on the right shoulder and feels weak when she tries to move it. She denies any popping/clicking or feelings of instability. She reports shoulder  pain has been ongoing for past 5 years, but does recall blocking a shot in August and started to have more shoulder pain after this.     PERTINENT HISTORY: ORIF Lt hand  Exercise induced asthma  PAIN:  Are you having pain? Yes: NPRS scale: none currently; at worst 8 (hamstring); 2 currently (Rt shoulder)  Pain location: Rt lateral shoulder; Lt hamstring Pain description: dull ache (shoulder); sharp (knee) Aggravating factors:  overhead lifting,raising (shoulder), excessive stretch/movement (hamstring)  Relieving factors: ice,strengthening  PRECAUTIONS: None  RED FLAGS: None   WEIGHT BEARING RESTRICTIONS: No  FALLS:  Has patient fallen in last 6 months? Yes. Number of falls plays basketball (multiple falls)   LIVING ENVIRONMENT: Lives with: lives with their family Lives in: House/apartment Stairs: Yes: Internal: 15 steps; on right going up Has following equipment at home: None  OCCUPATION: professional basketball player; out of season for now  PLOF: Independent  PATIENT GOALS: "I want to be able to bend it." (Knee); "sleep on it." (Shoulder); main goal is return to basketball   NEXT MD VISIT: nothing scheduled   OBJECTIVE:  Note: Objective measures were completed at Evaluation unless otherwise noted.  DIAGNOSTIC FINDINGS:  Lt knee MRI: IMPRESSION: 1. Free edge fraying of the posterior horn and body of the lateral meniscus without discrete radial tear or displaced meniscal fragment. 2. The medial meniscus, cruciate and collateral ligaments are intact. 3. Prominent tricompartmental degenerative changes for age, most advanced in the patellofemoral and lateral compartments. No acute osseous findings. 4. Small knee joint effusion.  Rt Shoulder X-ray: IMPRESSION: Negative radiographs of the right shoulder.  PATIENT SURVEYS:  Patient-specific activity scoring scheme (Point to one number):  "0" represents "unable to perform." "10" represents "able to perform at  prior level. 0 1 2 3 4 5 6 7 8 9  10 (Date and Score) Activity Initial  Activity Eval  01/13/24   Squatting   6  7  Sleeping on Rt side  0  4  Running 0 0  Jumping  0 0  Total 1.5 2.75   Additional Additional Total score = sum of the activity scores/number of activities Minimum detectable change (90%CI) for average score = 2 points Minimum detectable change (90%CI) for single activity score = 3 points PSFS developed by: [place] Spitz., & Binkley, J. (1995). Assessing disability and change on individual  patients: a report of a patient specific measure. Physiotherapy [place], 47, [phone]. Reproduced with the permission of the authors  Score: 1.5 total    COGNITION: Overall cognitive status: Within functional limits for tasks assessed     SENSATION: Not tested  EDEMA:  No obvious swelling about the knee or shoulder   MUSCLE LENGTH: Hamstrings: Right lacking 2 deg;  Left lacking 35 deg pain   POSTURE: No Significant postural limitations  PALPATION: TTP left lateral hamstring Did not palpate Rt shoulder   LOWER EXTREMITY ROM:  Active ROM Right eval Left eval 01/05/24 Left  01/13/24 Left   Hip flexion      Hip extension      Hip abduction      Hip adduction      Hip internal rotation      Hip external rotation      Knee flexion 135 119 pain 124 pain 130 pain   Knee extension Full  Full     Ankle dorsiflexion      Ankle plantarflexion      Ankle inversion      Ankle eversion       (Blank rows = not tested)  LOWER EXTREMITY MMT:  MMT Right eval Left eval 01/13/24 Left   Hip flexion 4+ 4 4+   Hip extension 4+ 4+ 4+  Hip abduction 4+ 4- pain  4  Hip adduction     Hip internal rotation     Hip external rotation     Knee flexion 5 4+ pain  4+ pain   Knee extension 4+ 4+ pain 5   Ankle dorsiflexion     Ankle plantarflexion     Ankle inversion     Ankle eversion      (Blank rows = not tested)  UPPER EXTREMITY ROM:  Active ROM  Right eval Left eval 01/13/24 Right   Shoulder flexion 150 180 170  Shoulder extension     Shoulder abduction 180 pain 180 180  Shoulder adduction     Shoulder extension     Shoulder internal rotation 65 85 85  Shoulder external rotation 90 90 82 pain  Elbow flexion     Elbow extension     Wrist flexion     Wrist extension     Wrist ulnar deviation     Wrist radial deviation     Wrist pronation     Wrist supination      (Blank rows = not tested)   UPPER EXTREMITY MMT:  MMT Right eval Left eval 01/13/24 Right   Shoulder flexion 4 5 4+ pain   Shoulder extension     Shoulder abduction 5 5 5   Shoulder adduction     Shoulder extension     Shoulder internal rotation 5 5 5   Shoulder external rotation 4- pain  4 4-  Middle trapezius 4 pain 4+ 4 pain   Lower trapezius     Elbow flexion     Elbow extension     Wrist flexion     Wrist extension     Wrist ulnar deviation     Wrist radial deviation     Wrist pronation     Wrist supination     Grip strength      (Blank rows = not tested)  SPECIAL TESTS:  (+) Yergason's, Speeds, Neer's, Hawkin's Kennedy (-) Empty Can  (+) McMurray's for pain  (-) Anterior drawer, posterior drawer, valgus, varus   FUNCTIONAL TESTS:  Squat: WNL pain left lateral knee  GAIT: Distance walked: 10 ft  Assistive device utilized: None Level of assistance: Complete Independence Comments: no obvious gait abnormalities  [facility] Adult PT Treatment:  DATE: 01/13/24  Manual Therapy: IASTM Lt hamstring  K-tape postural corrective: I strip O to I upper trap x 50% tension, I strip coracoid process to medial scapular border 50% tension, I strip coracoid process to inferior scapular angle x 50% tension  Neuromuscular re-ed: Mini squats 2 x 10  Wall sit mini 2 x 30 sec SL bridge x 10, x 8, x 10 Prone hip extension 2 x 10   Serratus wall slides 2 x 10  Therapeutic Activity: Re-assessment to determine overall  progress, educating patient on progress towards goals.  Modalities: Ice to Rt shoulder and Lt knee x 10 minutes     [facility] Adult PT Treatment:                                                DATE: 01/10/24 Therapeutic Exercise: Pec doorway stretch x 1 minute  Manual Therapy: Skilled palpation of trigger points  K-tape postural corrective: I strip O to I upper trap x 50% tension, I strip coracoid process to medial scapular border 50% tension, I strip coracoid process to inferior scapular angle x 50% tension  Demo and returned demo of use of tennis ball for self-soft tissue mobilization.  Neuromuscular re-ed: Prone HS curl 2 x 10  Seated resisted HS curl yellow band 2 x 10  Resisted shoulder extension 2 x 15, green band  Reactive ER/IR isometrics x 10 each  Therapeutic Activity: Wall squat 2 x 10  Modalities: Ice to Rt shoulder and Lt knee x 10 minutes  Trigger Point Dry Needling  Subsequent Treatment: Instructions provided previously at initial dry needling treatment.   Patient Verbal Consent Given: Yes Education Handout Provided: Previously Provided Muscles Treated: Rt middle deltoid  Electrical Stimulation Performed: No Treatment Response/Outcome: twitch response elicited     [facility] Adult PT Treatment:                                                DATE: 01/05/24 Therapeutic Exercise: Pec doorway stretch x 30 sec Prone shoulder extension 2 x 10  Manual Therapy: Skilled palpation of trigger points  Neuromuscular re-ed: Prone HS curl to 90 degrees 2 x 10  Reverse hyperextension 2 x 10  Hip bridge on physioball 2 x 10  Prone scapular retraction 2 x 10  Sidelying shoulder ER 1 x 10 @ 1 lb; 1 x 10 @ 2 lb Prone T d/c due to pain  Trigger Point Dry Needling  Initial Treatment: Pt instructed on Dry Needling rational, procedures, and possible side effects. Pt instructed to expect mild to moderate muscle soreness later in the day and/or into the next day.  Pt instructed in methods  to reduce muscle soreness. Pt instructed to continue prescribed HEP. Patient was educated on signs and symptoms of infection and other risk factors and advised to seek medical attention should they occur.  Patient verbalized understanding of these instructions and education.   Patient Verbal Consent Given: Yes Education Handout Provided: Yes Muscles Treated: Rt deltoid  Electrical Stimulation Performed: No Treatment Response/Outcome: twitch response elicited    Modalities: Ice to Rt shoulder and Lt knee x 10 minutes    [facility] Adult PT Treatment:  DATE: 01/03/2024 Therapeutic Exercise: Side Lying:  HS curls x10, x5, x8, x6 Straight leg hip abd 2x15 Kicks forward/backward Standing hip extension x10 S/L sleeper stretch  Neuromuscular re-ed: Supine HS isometric with knee slightly bent 10x5" HS isometric at 90 degrees x 10; 5 sec hold (PT assist through concentic/eccentric phase) Supine on foam roller: Scap punches Angel arms (low)  Isometric row + Blue TB   PATIENT EDUCATION:  Education details: HEP update  Person educated: Patient Education method: Programmer, multimedia, Demonstration, Actor cues, Verbal cues, and Handouts Education comprehension: verbalized understanding, returned demonstration, verbal cues required, tactile cues required, and needs further education  HOME EXERCISE PROGRAM: Access Code: [email or web address] URL: [email or web address] Date: 01/13/2024 Prepared by: Forrestine Ike  Exercises - Seated Hamstring Stretch  - 2 x daily - 7 x weekly - 3 sets - 30 sec  hold - Supine Static Chest Stretch on Foam Roll  - 2 x daily - 7 x weekly - 1 sets - 1 min hold - Sleeper Stretch  - 2 x daily - 7 x weekly - 3 sets - 30 sec  hold - Supine Scapular Protraction in Flexion with Dumbbells  - 1 x daily - 7 x weekly - 2 sets - 15 reps - Seated Row Cable Machine  - 1 x daily - 7 x weekly - 3 sets - 10 reps - Sidelying Hip Abduction   - 1 x daily - 7 x weekly - 3 sets - 10 reps - Prone Knee Flexion  - 1 x daily - 7 x weekly - 2 sets - 10 reps - Bridge with Heels on [employer]  - 1 x daily - 7 x weekly - 2 sets - 10 reps - Sidelying Shoulder ER with Towel and Dumbbell  - 1 x daily - 7 x weekly - 2 sets - 10 reps - Seated Hamstring Curl with Anchored Resistance  - 1 x daily - 7 x weekly - 2 sets - 10 reps - Shoulder External Rotation Reactive Isometrics  - 1 x daily - 7 x weekly - 2 sets - 10 reps - Shoulder Internal Rotation Reactive Isometrics  - 1 x daily - 7 x weekly - 2 sets - 10 reps - Wall Sit  - 1 x daily - 7 x weekly - 3 sets - 30 sec  hold - Mini Squat  - 1 x daily - 7 x weekly - 2 sets - 10 reps - Prone Hip Extension  - 1 x daily - 7 x weekly - 2 sets - 10 reps - Single Leg Bridge  - 1 x daily - 7 x weekly - 2 sets - 10 reps - Serratus Activation at Wall  - 1 x daily - 7 x weekly - 2 sets - 10 reps  ASSESSMENT:  CLINICAL IMPRESSION: [place] is making steady progress in PT for her Lt knee pain and Rt shoulder pain. She is tolerating progressive load on her hamstring initially only tolerating gentle isometrics and is now tolerating light resisted OKC and partial range CKC strengthening. She demonstrates improvements in knee flexion AROM, but continues to have hamstring pain at available end range. Hip weakness remains present and MMT of the hamstring continues to elicit pain, indicative of ongoing hamstring pathology. In regards to her shoulder, her ROM and strength are improving, though she continues to exhibit weakness and postural deficits that are likely contributory factors of this chronic pain. She will benefit from continuing with current POC to address ongoing ROM, strength, and  postural deficits in order to assist in overall pain reduction necessary for her to return to professional basketball.    EVAL: Patient is a 28 y.o. female who was seen today for physical therapy evaluation and treatment for Lt knee pain  and Rt shoulder pain.  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 Her Rt shoulder pain has been present for the past 5 years, but recently worsened in the fall with a blocking maneuver while playing basketball. In regards to the Lt knee she is noted to have palpable tenderness about Lt lateral hamstring and lateral joint line, pain and weakness with Lt knee MMT, and bilateral hip weakness. In regards to the Rt shoulder she has positive impingement and bicep special testing, limited shoulder flexion and IR AROM, Rt shoulder weakness, and periscapular weakness. Patient will benefit from skilled PT to address the above stated deficits in order to optimize their function and assist in overall pain reduction.    OBJECTIVE IMPAIRMENTS: decreased activity tolerance, decreased balance, decreased endurance, decreased knowledge of condition, difficulty walking, decreased ROM, decreased strength, increased fascial restrictions, impaired flexibility, impaired UE functional use, improper body mechanics, and pain.   ACTIVITY LIMITATIONS: carrying, lifting, bending, standing, squatting, sleeping, stairs, reach over head, and locomotion level  PARTICIPATION LIMITATIONS: cleaning, laundry, shopping, community activity, and occupation  PERSONAL FACTORS: Age, Past/current experiences, Profession, and Time since onset of injury/illness/exacerbation are also affecting patient's functional outcome.   REHAB POTENTIAL: Good  CLINICAL DECISION MAKING: Evolving/moderate complexity  EVALUATION COMPLEXITY: Moderate   GOALS: Goals reviewed with patient? Yes  SHORT TERM GOALS: Target date: 01/25/2024  Patient will be  independent and compliant with initial HEP.  Baseline: issued at eval Goal status: MET  2.  Patient will demonstrate at least 130 degrees of pain free Lt knee flexion AROM to improve ability to complete squatting activity.  Baseline: see above Goal status: partially met   3.  Patient will demonstrate 180 degrees of Rt shoulder flexion AROM to improve ability to block in basketball Baseline: see above Goal status: progressing   4.  Patient will demonstrate at least 80 degrees of Rt shoulder IR AROM to improve ability to complete self-care activities.  Baseline: see above  Goal status: met    LONG TERM GOALS: Target date: 02/26/24  Patient will score >/= 5 on PSFS to signify clinically meaningful improvement in functional abilities.  Baseline:  Goal status: progressing   2.  Patient will squat without pain to improve ability to maintain defensive stance in basketball.  Baseline: Lt knee pain 01/13/24: mini squat without pain  Goal status: progressing   3.  Patient will be able to run at least 10 minutes without onset of knee pain.  Baseline: unable 01/13/24: not appropriate for running given ongoing hamstring pain  Goal status: not appropriate   4.  Patient will demonstrate 5/5 LLE strength to improve stability with jumping.  Baseline: see above Goal status: progressing   5.  Patient will demonstrate 5/5 Rt shoulder strength to improve tolerance to basketball shooting/throwing.  Baseline: see above  Goal status: progressing    PLAN:  PT FREQUENCY: 2x/week  PT DURATION: 8 weeks  PLANNED INTERVENTIONS: [id number]- PT Re-evaluation, 97110-Therapeutic exercises, 97530-  Therapeutic activity, [id number]- Neuromuscular re-education, [id number]- Self Care, [id number]- Manual therapy, [id number]- Gait training, [id number]- Aquatic Therapy, Taping, Dry Needling, Cryotherapy, and Moist heat  PLAN FOR NEXT SESSION: review and progress HEP prn; progress hamstring strength and stretching as tolerated. Hip  strengthening, shoulder/periscapular strengthening. Progress CKC as tolerated    Jong Rickman, PT, DPT, ATC 01/13/24 3:02 PM

## 2024-01-17 ENCOUNTER — Encounter: Payer: Self-pay | Admitting: Obstetrics & Gynecology

## 2024-01-17 ENCOUNTER — Ambulatory Visit: Admitting: Obstetrics & Gynecology

## 2024-01-17 ENCOUNTER — Ambulatory Visit

## 2024-01-17 VITALS — BP 121/83 | HR 75 | Ht 77.0 in | Wt 159.0 lb

## 2024-01-17 DIAGNOSIS — N920 Excessive and frequent menstruation with regular cycle: Secondary | ICD-10-CM | POA: Diagnosis not present

## 2024-01-17 DIAGNOSIS — G8929 Other chronic pain: Secondary | ICD-10-CM

## 2024-01-17 DIAGNOSIS — R636 Underweight: Secondary | ICD-10-CM | POA: Diagnosis not present

## 2024-01-17 DIAGNOSIS — M6281 Muscle weakness (generalized): Secondary | ICD-10-CM

## 2024-01-17 DIAGNOSIS — M25562 Pain in left knee: Secondary | ICD-10-CM

## 2024-01-17 DIAGNOSIS — Z681 Body mass index (BMI) 19 or less, adult: Secondary | ICD-10-CM | POA: Diagnosis not present

## 2024-01-17 DIAGNOSIS — Z01419 Encounter for gynecological examination (general) (routine) without abnormal findings: Secondary | ICD-10-CM

## 2024-01-17 NOTE — Progress Notes (Signed)
  Subjective:     Donna Montgomery is a 28 y.o. female here for a routine exam.  Current complaints: still bleeding heavy on the week off nuva ring .  She tried continuous nuva ring and still had a heavy menses replacing hte ring at week 3.     Gynecologic History Patient's last menstrual period was 01/10/2024 (exact date). Contraception: NuvaRing vaginal inserts Pt reports small thumb size blood clots during periods, no other concerns noted.. Last pap smear (date and result):10/19/2022 WNL Last mammogram (date and result):n/a Last colon screening (date and result):n/a Brush:yes Floss:yes Seatbelts: yes Sunscreen: yes   Obstetric History OB History  Gravida Para Term Preterm AB Living  0 0 0 0 0 0  SAB IAB Ectopic Multiple Live Births  0 0 0 0 0     The following portions of the patient's history were reviewed and updated as appropriate: allergies, current medications, past family history, past medical history, past social history, past surgical history, and problem list.  Review of Systems Pertinent items noted in HPI and remainder of comprehensive ROS otherwise negative.    Objective:     Vitals:   01/17/24 0905  BP: 121/83  Pulse: 75  Weight: 159 lb (72.1 kg)  Height: 6\' 5"  (1.956 m)   Vitals:  WNL General appearance: alert, cooperative and no distress  HEENT: Normocephalic, without obvious abnormality, atraumatic Eyes: negative Throat: lips, mucosa, and tongue normal; teeth and gums normal  Respiratory: Clear to auscultation bilaterally  CV: Regular rate and rhythm  Breasts:  Normal appearance, no masses or tenderness, no nipple retraction or dimpling  GI: Soft, non-tender; bowel sounds normal; no masses,  no organomegaly  GU: External Genitalia:  Tanner V, no lesion Urethra:  No prolapse   Vagina: Pink, normal rugae, no blood or discharge  Cervix: No CMT, no lesion  Uterus:  Normal size and contour, non tender  Adnexa: Normal, no masses, non tender   Musculoskeletal: No edema, redness or tenderness in the calves or thighs  Skin: No lesions or rash  Lymphatic: Axillary adenopathy: none     Psychiatric: Normal mood and behavior        Assessment:    Healthy female exam.    Plan:     Pap up to date.  Rpt TVUS to look at lining.  She finished menses Saturday and wil lplace Nuva Ring today.  Lining should be thin this week.  Low BMI--wants to meet with nutrition to see about gaining weight nad adding more protein.  Referral placed.

## 2024-01-17 NOTE — Therapy (Addendum)
 OUTPATIENT PHYSICAL THERAPY TREATMENT   Patient Name: Donna Montgomery MRN: [id number] DOB:December 22, 1995, 28 y.o., female Today's Date: 01/17/2024  END OF SESSION:  PT End of Session - 01/17/24 1405     Visit Number 7    Number of Visits 17    Date for PT Re-Evaluation 02/26/24    Authorization Type [employer]    Authorization Time Period 5/2-5/31/25    Authorization - Visit Number 1    Authorization - Number of Visits 4    PT Start Time 1405    PT Stop Time 1455    PT Time Calculation (min) 50 min    Activity Tolerance Patient tolerated treatment well    Behavior During Therapy [facility] for tasks assessed/performed                 Past Medical History:  Diagnosis Date   Allergy    Anemia 2014   My blood tests in college said i had low iron and I was given medication. It has been low since.   Anxiety 2014   Had anxiety my whole life, but recognized in college by health professional.   Asthma    Depression 2014   Noticed symptoms in college, was put onto two different medicines. Stopped taking medication and seeked therapy. Symptoms come and go.   Ulcer 2013   Bleeding ulcer from H. Bacteria.   Past Surgical History:  Procedure Laterality Date   FRACTURE SURGERY  2017   Hand fracture   OPEN REDUCTION INTERNAL FIXATION (ORIF) HAND Left 2017   Patient Active Problem List   Diagnosis Date Noted   GAD (generalized anxiety disorder) 08/05/2023   Current moderate episode of major depressive disorder without prior episode (HCC) 08/05/2023   Acute pain of right shoulder 08/05/2023   Acute otitis externa of left ear 08/05/2023   Tinnitus of left ear 08/05/2023   Menorrhagia with regular cycle 10/24/2022   Iron deficiency anemia 10/24/2022   Depression, recurrent (HCC) 03/25/2020   Anxiety 03/25/2020   Chronic toe pain, right foot 07/27/2017   Asthma 08/29/2010   EXERCISE INDUCED ASTHMA 07/11/2010    PCP: Josepha Nickels, DO   REFERRING PROVIDER: Rodgers Clack, DO    REFERRING DIAG:  [id number] (ICD-10-CM) - Internal derangement of left knee  M25.511,G89.29 (ICD-10-CM) - Chronic right shoulder pain    THERAPY DIAG:  Acute pain of left knee  Chronic right shoulder pain  Muscle weakness (generalized)  Rationale for Evaluation and Treatment: Rehabilitation  ONSET DATE: shoulder exacerbation August 2023; knee March 2024   SUBJECTIVE:   SUBJECTIVE STATEMENT:  Patient reports the hamstring is ok, but can still feel it mostly when she first gets out of the car. Shoulder is feeling good right now.   EVAL: Patient was playing basketball in early March and the first incident of knee pain was running to block a ball and had an ultrasound and was told by the doctor that she inflamed her hamstring tendon. She kept playing and then in the same game she got a fast break and was fouled and she landed on the players foot and her knee went out to the side. She denies hearing any popping/clicking. She kept playing, but was limping. She continued to play in games, but did not practice until end of March. She worked with the PT while recovering, but didn't do a ton of strengthening. [place] and was referred to PT. The pain is  located along the lateral hamstring. She reports there is a "new sound" in the knee since her injury, but does not think it is popping/clicking. Initially she had instances of the knee giving away, but not recently. Since returning home she has been resting and relaxing with pilates being her only exercise currently.   Patient reports she is right handed and blocks a lot of shots. Her Rt shoulder has been bothering her a lot more this past year compared to previous years. It hurts to sleep on the right shoulder and feels weak when she tries to move it. She denies any popping/clicking or feelings of instability. She reports shoulder pain has been ongoing for past 5 years, but does recall blocking a shot in August and  started to have more shoulder pain after this.     PERTINENT HISTORY: ORIF Lt hand  Exercise induced asthma  PAIN:  Are you having pain? Yes: NPRS scale: 1(hamstring); none currently (Rt shoulder)  Pain location: Rt lateral shoulder; Lt hamstring Pain description: dull ache (shoulder); sharp (knee) Aggravating factors:  overhead lifting,raising (shoulder), excessive stretch/movement (hamstring)  Relieving factors: ice,strengthening  PRECAUTIONS: None  RED FLAGS: None   WEIGHT BEARING RESTRICTIONS: No  FALLS:  Has patient fallen in last 6 months? Yes. Number of falls plays basketball (multiple falls)   LIVING ENVIRONMENT: Lives with: lives with their family Lives in: House/apartment Stairs: Yes: Internal: 15 steps; on right going up Has following equipment at home: None  OCCUPATION: professional basketball player; out of season for now  PLOF: Independent  PATIENT GOALS: "I want to be able to bend it." (Knee); "sleep on it." (Shoulder); main goal is return to basketball   NEXT MD VISIT: nothing scheduled   OBJECTIVE:  Note: Objective measures were completed at Evaluation unless otherwise noted.  DIAGNOSTIC FINDINGS:  Lt knee MRI: IMPRESSION: 1. Free edge fraying of the posterior horn and body of the lateral meniscus without discrete radial tear or displaced meniscal fragment. 2. The medial meniscus, cruciate and collateral ligaments are intact. 3. Prominent tricompartmental degenerative changes for age, most advanced in the patellofemoral and lateral compartments. No acute osseous findings. 4. Small knee joint effusion.  Rt Shoulder X-ray: IMPRESSION: Negative radiographs of the right shoulder.  PATIENT SURVEYS:  Patient-specific activity scoring scheme (Point to one number):  "0" represents "unable to perform." "10" represents "able to perform at prior level. 0 1 2 3 4 5 6 7 8 9  10 (Date and Score) Activity Initial  Activity Eval  01/13/24   Squatting    6  7  Sleeping on Rt side  0  4  Running 0 0  Jumping  0 0  Total 1.5 2.75   Additional Additional Total score = sum of the activity scores/number of activities Minimum detectable change (90%CI) for average score = 2 points Minimum detectable change (90%CI) for single activity score = 3 points PSFS developed by: [place] Spitz., & Binkley, J. (1995). Assessing disability and change on individual  patients: a report of a patient specific measure. Physiotherapy [place], [phone], [phone]. Reproduced with the permission of the authors  Score: 1.5 total    COGNITION: Overall cognitive status: Within functional limits for tasks assessed     SENSATION: Not tested  EDEMA:  No obvious swelling about the knee or shoulder   MUSCLE LENGTH: Hamstrings: Right lacking 2 deg; Left lacking 35 deg pain   POSTURE: No Significant postural limitations  PALPATION: TTP left lateral hamstring Did not  palpate Rt shoulder   LOWER EXTREMITY ROM:  Active ROM Right eval Left eval 01/05/24 Left  01/13/24 Left   Hip flexion      Hip extension      Hip abduction      Hip adduction      Hip internal rotation      Hip external rotation      Knee flexion 135 119 pain 124 pain 130 pain   Knee extension Full  Full     Ankle dorsiflexion      Ankle plantarflexion      Ankle inversion      Ankle eversion       (Blank rows = not tested)  LOWER EXTREMITY MMT:  MMT Right eval Left eval 01/13/24 Left   Hip flexion 4+ 4 4+   Hip extension 4+ 4+ 4+  Hip abduction 4+ 4- pain  4  Hip adduction     Hip internal rotation     Hip external rotation     Knee flexion 5 4+ pain  4+ pain   Knee extension 4+ 4+ pain 5   Ankle dorsiflexion     Ankle plantarflexion     Ankle inversion     Ankle eversion      (Blank rows = not tested)  UPPER EXTREMITY ROM:  Active ROM Right eval Left eval 01/13/24 Right   Shoulder flexion 150 180 170  Shoulder extension     Shoulder abduction 180  pain 180 180  Shoulder adduction     Shoulder extension     Shoulder internal rotation 65 85 85  Shoulder external rotation 90 90 82 pain  Elbow flexion     Elbow extension     Wrist flexion     Wrist extension     Wrist ulnar deviation     Wrist radial deviation     Wrist pronation     Wrist supination      (Blank rows = not tested)   UPPER EXTREMITY MMT:  MMT Right eval Left eval 01/13/24 Right   Shoulder flexion 4 5 4+ pain   Shoulder extension     Shoulder abduction 5 5 5   Shoulder adduction     Shoulder extension     Shoulder internal rotation 5 5 5   Shoulder external rotation 4- pain  4 4-  Middle trapezius 4 pain 4+ 4 pain   Lower trapezius     Elbow flexion     Elbow extension     Wrist flexion     Wrist extension     Wrist ulnar deviation     Wrist radial deviation     Wrist pronation     Wrist supination     Grip strength      (Blank rows = not tested)  SPECIAL TESTS:  (+) Yergason's, Speeds, Neer's, Hawkin's Kennedy (-) Empty Can  (+) McMurray's for pain  (-) Anterior drawer, posterior drawer, valgus, varus   FUNCTIONAL TESTS:  Squat: WNL pain left lateral knee  GAIT: Distance walked: 10 ft  Assistive device utilized: None Level of assistance: Complete Independence Comments: no obvious gait abnormalities  [facility] Adult PT Treatment:                                                DATE: 01/17/24 Therapeutic Exercise: Recumbent bike level 3 x  5 minutes  Seated HS stretch x 30 sec   Neuromuscular re-ed: Prone I on physioball 2 x 15  Resisted IR green band 2 x 10  Resisted ER red band 2 x 10  Supine horizontal shoulder abduction green band 2 x 10  Therapeutic Activity: Deadlift  x 10 @ 10 dumbbells, x 10 @ 40 lb kettlebell  Squat x 10 @ 40 lb kettlebell  Split squat x 10 each   Modalities: Ice to Rt shoulder and Lt knee x 10 minutes    [facility] Adult PT Treatment:                                                DATE: 01/13/24  Manual Therapy: IASTM  Lt hamstring  K-tape postural corrective: I strip O to I upper trap x 50% tension, I strip coracoid process to medial scapular border 50% tension, I strip coracoid process to inferior scapular angle x 50% tension  Neuromuscular re-ed: Mini squats 2 x 10  Wall sit mini 2 x 30 sec SL bridge x 10, x 8, x 10 Prone hip extension 2 x 10   Serratus wall slides 2 x 10  Therapeutic Activity: Re-assessment to determine overall progress, educating patient on progress towards goals.  Modalities: Ice to Rt shoulder and Lt knee x 10 minutes     [facility] Adult PT Treatment:                                                DATE: 01/10/24 Therapeutic Exercise: Pec doorway stretch x 1 minute  Manual Therapy: Skilled palpation of trigger points  K-tape postural corrective: I strip O to I upper trap x 50% tension, I strip coracoid process to medial scapular border 50% tension, I strip coracoid process to inferior scapular angle x 50% tension  Demo and returned demo of use of tennis ball for self-soft tissue mobilization.  Neuromuscular re-ed: Prone HS curl 2 x 10  Seated resisted HS curl yellow band 2 x 10  Resisted shoulder extension 2 x 15, green band  Reactive ER/IR isometrics x 10 each  Therapeutic Activity: Wall squat 2 x 10  Modalities: Ice to Rt shoulder and Lt knee x 10 minutes  Trigger Point Dry Needling  Subsequent Treatment: Instructions provided previously at initial dry needling treatment.   Patient Verbal Consent Given: Yes Education Handout Provided: Previously Provided Muscles Treated: Rt middle deltoid  Electrical Stimulation Performed: No Treatment Response/Outcome: twitch response elicited     [facility] Adult PT Treatment:                                                DATE: 01/05/24 Therapeutic Exercise: Pec doorway stretch x 30 sec Prone shoulder extension 2 x 10  Manual Therapy: Skilled palpation of trigger points  Neuromuscular re-ed: Prone HS curl to 90 degrees 2 x 10   Reverse hyperextension 2 x 10  Hip bridge on physioball 2 x 10  Prone scapular retraction 2 x 10  Sidelying shoulder ER 1 x 10 @ 1 lb; 1 x 10 @ 2 lb  Prone T d/c due to pain  Trigger Point Dry Needling  Initial Treatment: Pt instructed on Dry Needling rational, procedures, and possible side effects. Pt instructed to expect mild to moderate muscle soreness later in the day and/or into the next day.  Pt instructed in methods to reduce muscle soreness. Pt instructed to continue prescribed HEP. Patient was educated on signs and symptoms of infection and other risk factors and advised to seek medical attention should they occur.  Patient verbalized understanding of these instructions and education.   Patient Verbal Consent Given: Yes Education Handout Provided: Yes Muscles Treated: Rt deltoid  Electrical Stimulation Performed: No Treatment Response/Outcome: twitch response elicited    Modalities: Ice to Rt shoulder and Lt knee x 10 minutes     PATIENT EDUCATION:  Education details: HEP update  Person educated: Patient Education method: Explanation, Demonstration, Tactile cues, Verbal cues, and Handouts Education comprehension: verbalized understanding, returned demonstration, verbal cues required, tactile cues required, and needs further education  HOME EXERCISE PROGRAM: Access Code: [email or web address] URL: [email or web address] Date: 01/17/2024 Prepared by: Forrestine Ike  Exercises - Seated Hamstring Stretch  - 2 x daily - 7 x weekly - 3 sets - 30 sec  hold - Supine Static Chest Stretch on Foam Roll  - 2 x daily - 7 x weekly - 1 sets - 1 min hold - Sleeper Stretch  - 2 x daily - 7 x weekly - 3 sets - 30 sec  hold - Supine Scapular Protraction in Flexion with Dumbbells  - 1 x daily - 7 x weekly - 2 sets - 15 reps - Seated Row Cable Machine  - 1 x daily - 7 x weekly - 3 sets - 10 reps - Sidelying Hip Abduction  - 1 x daily - 7 x weekly - 3 sets - 10 reps - Sidelying  Shoulder ER with Towel and Dumbbell  - 1 x daily - 7 x weekly - 2 sets - 10 reps - Seated Hamstring Curl with Anchored Resistance  - 1 x daily - 7 x weekly - 2 sets - 10 reps - Wall Sit  - 1 x daily - 7 x weekly - 3 sets - 30 sec  hold - Prone Hip Extension  - 1 x daily - 7 x weekly - 2 sets - 10 reps - Single Leg Bridge  - 1 x daily - 7 x weekly - 2 sets - 10 reps - Serratus Activation at Wall  - 1 x daily - 7 x weekly - 2 sets - 10 reps - [place] Deadlift With Barbell  - 1 x daily - 3 x weekly - 2 sets - 10 reps - Barbell Squat  - 1 x daily - 3 x weekly - 2 sets - 10 reps - Single Leg Lunge with Foot on Bench  - 1 x daily - 3 x weekly - 2 sets - 10 reps - Shoulder Internal Rotation with Resistance  - 1 x daily - 7 x weekly - 2 sets - 10 reps - Shoulder External Rotation with Anchored Resistance  - 1 x daily - 7 x weekly - 2 sets - 10 reps - Supine Shoulder Horizontal Abduction with Resistance  - 1 x daily - 7 x weekly - 2 sets - 10 reps  ASSESSMENT:  CLINICAL IMPRESSION: Able to progress hamstring strengthening with good tolerance. Able to increase resistance with hamstring curl without onset of pain. She demonstrates good form and control with deadlift without hamstring pain. Able to  complete full range squat today without onset of pain with moderate load, having met this functional goal. In regards to the shoulder we focused on postural correctives with fairly good tolerance with exception of prone W on physioball as this created immediate lateral shoulder pain and was discontinued.    EVAL: Patient is a 27 y.o. female who was seen today for physical therapy evaluation and treatment for Lt knee pain and Rt shoulder pain.  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 Her left knee pain occurred while playing professional basketball last month describing a running maneuver that injured her hamstring and then another mechanism  of planting the foot and varus movement of the knee in the same game. Her Rt shoulder pain has been present for the past 5 years, but recently worsened in the fall with a blocking maneuver while playing basketball. In regards to the Lt knee she is noted to have palpable tenderness about Lt lateral hamstring and lateral joint line, pain and weakness with Lt knee MMT, and bilateral hip weakness. In regards to the Rt shoulder she has positive impingement and bicep special testing, limited shoulder flexion and IR AROM, Rt shoulder weakness, and periscapular weakness. Patient will benefit from skilled PT to address the above stated deficits in order to optimize their function and assist in overall pain reduction.    OBJECTIVE IMPAIRMENTS: decreased activity tolerance, decreased balance, decreased endurance, decreased knowledge of condition, difficulty walking, decreased ROM, decreased strength, increased fascial restrictions, impaired flexibility, impaired UE functional use, improper body mechanics, and pain.   ACTIVITY LIMITATIONS: carrying, lifting, bending, standing, squatting, sleeping, stairs, reach over head, and locomotion level  PARTICIPATION LIMITATIONS: cleaning, laundry, shopping, community activity, and occupation  PERSONAL FACTORS: Age, Past/current experiences, Profession, and Time since onset of injury/illness/exacerbation are also affecting patient's functional outcome.   REHAB POTENTIAL: Good  CLINICAL DECISION MAKING: Evolving/moderate complexity  EVALUATION COMPLEXITY: Moderate   GOALS: Goals reviewed with patient? Yes  SHORT TERM GOALS: Target date: 01/25/2024  Patient will be independent and compliant with initial HEP.  Baseline: issued at eval Goal status: MET  2.  Patient will demonstrate at least 130 degrees of pain free Lt knee flexion AROM to improve ability to complete squatting activity.  Baseline: see above Goal status: partially met   3.  Patient will  demonstrate 180 degrees of Rt shoulder flexion AROM to improve ability to block in basketball Baseline: see above Goal status: progressing   4.  Patient will demonstrate at least 80 degrees of Rt shoulder IR AROM to improve ability to complete self-care activities.  Baseline: see above  Goal status: met    LONG TERM GOALS: Target date: 02/26/24  Patient will score >/= 5 on PSFS to signify clinically meaningful improvement in functional abilities.  Baseline:  Goal status: progressing   2.  Patient will squat without pain to improve ability to maintain defensive stance in basketball.  Baseline: Lt knee pain 01/13/24: mini squat without pain  Goal status: MET  3.  Patient will be able to run at least 10 minutes without onset of knee pain.  Baseline: unable 01/13/24: not appropriate for running given ongoing hamstring pain  Goal status: not appropriate   4.  Patient will demonstrate 5/5 LLE strength to improve stability with jumping.  Baseline: see above Goal status: progressing   5.  Patient will demonstrate 5/5 Rt shoulder strength to improve tolerance to basketball shooting/throwing.  Baseline: see above  Goal status: progressing    PLAN:  PT FREQUENCY: 2x/week  PT DURATION: 8 weeks  PLANNED INTERVENTIONS: [id number]- PT Re-evaluation, 97110-Therapeutic exercises, 97530- Therapeutic activity, [id number]- Neuromuscular re-education, 97535- Self Care, [id number]- Manual therapy, [id number]- Gait training, [phone]- Aquatic Therapy, Taping, Dry Needling, Cryotherapy, and Moist heat  PLAN FOR NEXT SESSION: review and progress HEP prn; progress hamstring strength and stretching as tolerated. Hip strengthening, shoulder/periscapular strengthening. Progress CKC as tolerated    Macarius Ruark, PT, DPT, ATC 01/17/24 2:50 PM

## 2024-01-19 ENCOUNTER — Ambulatory Visit (INDEPENDENT_AMBULATORY_CARE_PROVIDER_SITE_OTHER)

## 2024-01-19 DIAGNOSIS — N920 Excessive and frequent menstruation with regular cycle: Secondary | ICD-10-CM | POA: Diagnosis not present

## 2024-01-19 DIAGNOSIS — N854 Malposition of uterus: Secondary | ICD-10-CM | POA: Diagnosis not present

## 2024-01-20 ENCOUNTER — Encounter: Payer: Self-pay | Admitting: Family Medicine

## 2024-01-20 ENCOUNTER — Encounter

## 2024-01-24 ENCOUNTER — Ambulatory Visit

## 2024-01-24 DIAGNOSIS — M25562 Pain in left knee: Secondary | ICD-10-CM | POA: Diagnosis not present

## 2024-01-24 DIAGNOSIS — G8929 Other chronic pain: Secondary | ICD-10-CM

## 2024-01-24 DIAGNOSIS — M6281 Muscle weakness (generalized): Secondary | ICD-10-CM

## 2024-01-24 NOTE — Therapy (Signed)
 OUTPATIENT PHYSICAL THERAPY TREATMENT   Patient Name: Donna Montgomery MRN: [id number] DOB:[birth year], 28 y.o., female Today's Date: 01/24/2024  END OF SESSION:  PT End of Session - 01/24/24 1401     Visit Number 8    Number of Visits 17    Date for PT Re-Evaluation 02/26/24    Authorization Type [employer]    Authorization Time Period 5/2-5/31/25    Authorization - Visit Number 2    Authorization - Number of Visits 4    PT Start Time 1401    PT Stop Time 1501    PT Time Calculation (min) 60 min    Activity Tolerance Patient tolerated treatment well    Behavior During Therapy WFL for tasks assessed/performed                  Past Medical History:  Diagnosis Date   Allergy    Anemia 2014   My blood tests in college said i had low iron and I was given medication. It has been low since.   Anxiety 2014   Had anxiety my whole life, but recognized in college by health professional.   Asthma    Depression 2014   Noticed symptoms in college, was put onto two different medicines. Stopped taking medication and seeked therapy. Symptoms come and go.   Ulcer 2013   Bleeding ulcer from H. Bacteria.   Past Surgical History:  Procedure Laterality Date   FRACTURE SURGERY  2017   Hand fracture   OPEN REDUCTION INTERNAL FIXATION (ORIF) HAND Left 2017   Patient Active Problem List   Diagnosis Date Noted   GAD (generalized anxiety disorder) 08/05/2023   Current moderate episode of major depressive disorder without prior episode (HCC) 08/05/2023   Acute pain of right shoulder 08/05/2023   Acute otitis externa of left ear 08/05/2023   Tinnitus of left ear 08/05/2023   Menorrhagia with regular cycle 10/24/2022   Iron deficiency anemia 10/24/2022   Depression, recurrent (HCC) 03/25/2020   Anxiety 03/25/2020   Chronic toe pain, right foot 07/27/2017   Asthma 08/29/2010   EXERCISE INDUCED ASTHMA 07/11/2010    PCP: Josepha Nickels, DO   REFERRING PROVIDER: Rodgers Clack,  DO   REFERRING DIAG:  [phone] (ICD-10-CM) - Internal derangement of left knee  M25.511,G89.29 (ICD-10-CM) - Chronic right shoulder pain    THERAPY DIAG:  Acute pain of left knee  Chronic right shoulder pain  Muscle weakness (generalized)  Rationale for Evaluation and Treatment: Rehabilitation  ONSET DATE: shoulder exacerbation August 2023; knee March 2024   SUBJECTIVE:   SUBJECTIVE STATEMENT:  Patient reports the knee has been feeling good up until this morning and thinks it could be the weather. On Saturday she walked for 20 minutes (which included small hills) and that didn't bother the knee. Shoulder is getting better.   EVAL: Patient was playing basketball in early March and the first incident of knee pain was running to block a ball and had an ultrasound and was told by the doctor that she inflamed her hamstring tendon. She kept playing and then in the same game she got a fast break and was fouled and she landed on the players foot and her knee went out to the side. She denies hearing any popping/clicking. She kept playing, but was limping. She continued to play in games, but did not practice until end of March. She worked with the PT while recovering, but didn't do a ton of strengthening. [place] and was referred to PT. The pain is located along the lateral hamstring. She reports there is a "new sound" in the knee since her injury, but does not think it is popping/clicking. Initially she had instances of the knee giving away, but not recently. Since returning home she has been resting and relaxing with pilates being her only exercise currently.   Patient reports she is right handed and blocks a lot of shots. Her Rt shoulder has been bothering her a lot more this past year compared to previous years. It hurts to sleep on the right shoulder and feels weak when she tries to move it. She denies any popping/clicking or feelings of instability. She  reports shoulder pain has been ongoing for past 5 years, but does recall blocking a shot in August and started to have more shoulder pain after this.     PERTINENT HISTORY: ORIF Lt hand  Exercise induced asthma  PAIN:  Are you having pain? Yes: NPRS scale: 4(hamstring; none currently (Rt shoulder)  Pain location: Rt lateral shoulder; Lt hamstring Pain description: dull ache (shoulder); dull,ache (knee) Aggravating factors:  overhead lifting,raising (shoulder), excessive stretch/movement (hamstring)  Relieving factors: ice,strengthening  PRECAUTIONS: None  RED FLAGS: None   WEIGHT BEARING RESTRICTIONS: No  FALLS:  Has patient fallen in last 6 months? Yes. Number of falls plays basketball (multiple falls)   LIVING ENVIRONMENT: Lives with: lives with their family Lives in: House/apartment Stairs: Yes: Internal: 15 steps; on right going up Has following equipment at home: None  OCCUPATION: professional basketball player; out of season for now  PLOF: Independent  PATIENT GOALS: "I want to be able to bend it." (Knee); "sleep on it." (Shoulder); main goal is return to basketball   NEXT MD VISIT: nothing scheduled   OBJECTIVE:  Note: Objective measures were completed at Evaluation unless otherwise noted.  DIAGNOSTIC FINDINGS:  Lt knee MRI: IMPRESSION: 1. Free edge fraying of the posterior horn and body of the lateral meniscus without discrete radial tear or displaced meniscal fragment. 2. The medial meniscus, cruciate and collateral ligaments are intact. 3. Prominent tricompartmental degenerative changes for age, most advanced in the patellofemoral and lateral compartments. No acute osseous findings. 4. Small knee joint effusion.  Rt Shoulder X-ray: IMPRESSION: Negative radiographs of the right shoulder.  PATIENT SURVEYS:  Patient-specific activity scoring scheme (Point to one number):  "0" represents "unable to perform." "10" represents "able to perform at prior  level. 0 1 2 3 4 5 6 7 8 9  10 (Date and Score) Activity Initial  Activity Eval  01/13/24   Squatting   6  7  Sleeping on Rt side  0  4  Running 0 0  Jumping  0 0  Total 1.5 2.75   Additional Additional Total score = sum of the activity scores/number of activities Minimum detectable change (90%CI) for average score = 2 points Minimum detectable change (90%CI) for single activity score = 3 points PSFS developed by: [place] Spitz., & Binkley, J. (1995). Assessing disability and change on individual  patients: a report of a patient specific measure. Physiotherapy [place], 47, [phone]. Reproduced with the permission of the authors  Score: 1.5 total    COGNITION: Overall cognitive status: Within functional limits for tasks assessed     SENSATION: Not tested  EDEMA:  No obvious swelling about the knee or shoulder   MUSCLE LENGTH: Hamstrings: Right lacking 2 deg; Left lacking 35 deg pain  POSTURE: No Significant postural limitations  PALPATION: TTP left lateral hamstring Did not palpate Rt shoulder   LOWER EXTREMITY ROM:  Active ROM Right eval Left eval 01/05/24 Left  01/13/24 Left   Hip flexion      Hip extension      Hip abduction      Hip adduction      Hip internal rotation      Hip external rotation      Knee flexion 135 119 pain 124 pain 130 pain   Knee extension Full  Full     Ankle dorsiflexion      Ankle plantarflexion      Ankle inversion      Ankle eversion       (Blank rows = not tested)  LOWER EXTREMITY MMT:  MMT Right eval Left eval 01/13/24 Left   Hip flexion 4+ 4 4+   Hip extension 4+ 4+ 4+  Hip abduction 4+ 4- pain  4  Hip adduction     Hip internal rotation     Hip external rotation     Knee flexion 5 4+ pain  4+ pain   Knee extension 4+ 4+ pain 5   Ankle dorsiflexion     Ankle plantarflexion     Ankle inversion     Ankle eversion      (Blank rows = not tested)  UPPER EXTREMITY ROM:  Active ROM Right eval  Left eval 01/13/24 Right   Shoulder flexion 150 180 170  Shoulder extension     Shoulder abduction 180 pain 180 180  Shoulder adduction     Shoulder extension     Shoulder internal rotation 65 85 85  Shoulder external rotation 90 90 82 pain  Elbow flexion     Elbow extension     Wrist flexion     Wrist extension     Wrist ulnar deviation     Wrist radial deviation     Wrist pronation     Wrist supination      (Blank rows = not tested)   UPPER EXTREMITY MMT:  MMT Right eval Left eval 01/13/24 Right   Shoulder flexion 4 5 4+ pain   Shoulder extension     Shoulder abduction 5 5 5   Shoulder adduction     Shoulder extension     Shoulder internal rotation 5 5 5   Shoulder external rotation 4- pain  4 4-  Middle trapezius 4 pain 4+ 4 pain   Lower trapezius     Elbow flexion     Elbow extension     Wrist flexion     Wrist extension     Wrist ulnar deviation     Wrist radial deviation     Wrist pronation     Wrist supination     Grip strength      (Blank rows = not tested)  SPECIAL TESTS:  (+) Yergason's, Speeds, Neer's, Hawkin's Kennedy (-) Empty Can  (+) McMurray's for pain  (-) Anterior drawer, posterior drawer, valgus, varus   FUNCTIONAL TESTS:  Squat: WNL pain left lateral knee  GAIT: Distance walked: 10 ft  Assistive device utilized: None Level of assistance: Complete Independence Comments: no obvious gait abnormalities   [facility] Adult PT Treatment:  DATE: 01/24/24  Manual Therapy: IASTM Lt hamstring Passive hamstring stretch Lt Skilled palpation of trigger points  Trigger Point Dry Needling  Subsequent Treatment: Instructions provided previously at initial dry needling treatment.   Patient Verbal Consent Given: Yes Education Handout Provided: Previously Provided Muscles Treated: Lt bicep femoris  Electrical Stimulation Performed: No Treatment Response/Outcome: twitch response elicited    Neuromuscular  re-ed: SL squat 2 x 5 each  Nordic hamstring 2 x 4  Squat jump x 2 d/c  Bosu squat 3 x 30 sec  3 way hip on airex x 10 each  Modalities: Ice to Rt shoulder and Lt knee x 15 minutes    [facility] Adult PT Treatment:                                                DATE: 01/17/24 Therapeutic Exercise: Recumbent bike level 3 x 5 minutes  Seated HS stretch x 30 sec   Neuromuscular re-ed: Prone I on physioball 2 x 15  Resisted IR green band 2 x 10  Resisted ER red band 2 x 10  Supine horizontal shoulder abduction green band 2 x 10  Therapeutic Activity: Deadlift  x 10 @ 10 dumbbells, x 10 @ 40 lb kettlebell  Squat x 10 @ 40 lb kettlebell  Split squat x 10 each   Modalities: Ice to Rt shoulder and Lt knee x 10 minutes    [facility] Adult PT Treatment:                                                DATE: 01/13/24  Manual Therapy: IASTM Lt hamstring  K-tape postural corrective: I strip O to I upper trap x 50% tension, I strip coracoid process to medial scapular border 50% tension, I strip coracoid process to inferior scapular angle x 50% tension  Neuromuscular re-ed: Mini squats 2 x 10  Wall sit mini 2 x 30 sec SL bridge x 10, x 8, x 10 Prone hip extension 2 x 10   Serratus wall slides 2 x 10  Therapeutic Activity: Re-assessment to determine overall progress, educating patient on progress towards goals.  Modalities: Ice to Rt shoulder and Lt knee x 10 minutes     [facility] Adult PT Treatment:                                                DATE: 01/10/24 Therapeutic Exercise: Pec doorway stretch x 1 minute  Manual Therapy: Skilled palpation of trigger points  K-tape postural corrective: I strip O to I upper trap x 50% tension, I strip coracoid process to medial scapular border 50% tension, I strip coracoid process to inferior scapular angle x 50% tension  Demo and returned demo of use of tennis ball for self-soft tissue mobilization.  Neuromuscular re-ed: Prone HS curl 2 x 10  Seated resisted  HS curl yellow band 2 x 10  Resisted shoulder extension 2 x 15, green band  Reactive ER/IR isometrics x 10 each  Therapeutic Activity: Wall squat 2 x 10  Modalities: Ice to Rt shoulder and Lt knee  x 10 minutes  Trigger Point Dry Needling  Subsequent Treatment: Instructions provided previously at initial dry needling treatment.   Patient Verbal Consent Given: Yes Education Handout Provided: Previously Provided Muscles Treated: Rt middle deltoid  Electrical Stimulation Performed: No Treatment Response/Outcome: twitch response elicited      PATIENT EDUCATION:  Education details: HEP review  Person educated: Patient Education method: Programmer, multimedia, Demonstration, Actor cues, and Verbal cues Education comprehension: verbalized understanding, returned demonstration, verbal cues required, tactile cues required, and needs further education  HOME EXERCISE PROGRAM: Access Code: [email or web address] URL: [email or web address] Date: 01/17/2024 Prepared by: Forrestine Ike  Exercises - Seated Hamstring Stretch  - 2 x daily - 7 x weekly - 3 sets - 30 sec  hold - Supine Static Chest Stretch on Foam Roll  - 2 x daily - 7 x weekly - 1 sets - 1 min hold - Sleeper Stretch  - 2 x daily - 7 x weekly - 3 sets - 30 sec  hold - Supine Scapular Protraction in Flexion with Dumbbells  - 1 x daily - 7 x weekly - 2 sets - 15 reps - Seated Row Cable Machine  - 1 x daily - 7 x weekly - 3 sets - 10 reps - Sidelying Hip Abduction  - 1 x daily - 7 x weekly - 3 sets - 10 reps - Sidelying Shoulder ER with Towel and Dumbbell  - 1 x daily - 7 x weekly - 2 sets - 10 reps - Seated Hamstring Curl with Anchored Resistance  - 1 x daily - 7 x weekly - 2 sets - 10 reps - Wall Sit  - 1 x daily - 7 x weekly - 3 sets - 30 sec  hold - Prone Hip Extension  - 1 x daily - 7 x weekly - 2 sets - 10 reps - Single Leg Bridge  - 1 x daily - 7 x weekly - 2 sets - 10 reps - Serratus Activation at Wall  - 1 x daily - 7 x weekly - 2  sets - 10 reps - [place] Deadlift With Barbell  - 1 x daily - 3 x weekly - 2 sets - 10 reps - Barbell Squat  - 1 x daily - 3 x weekly - 2 sets - 10 reps - Single Leg Lunge with Foot on Bench  - 1 x daily - 3 x weekly - 2 sets - 10 reps - Shoulder Internal Rotation with Resistance  - 1 x daily - 7 x weekly - 2 sets - 10 reps - Shoulder External Rotation with Anchored Resistance  - 1 x daily - 7 x weekly - 2 sets - 10 reps - Supine Shoulder Horizontal Abduction with Resistance  - 1 x daily - 7 x weekly - 2 sets - 10 reps  ASSESSMENT:  CLINICAL IMPRESSION: Patient reported mild pain about the Lt hamstring upon arrival that has been present since this morning. TPDN performed to bicep femoris with twitch response elicited. She reported intense cramping in the hamstring post TPDN that was relieved following passive stretching and IASTM. Able to progress hamstring strengthening with patient able to complete a few reps of nordic hamstring strengthening before feeling discomfort about muscle belly. This discomfort subsided with rest. Attempted plyometrics today (jump squat), though after 2 reps she felt pulling pain about bicep femoris insertion, so this was discontinued. This pain subsided with rest and she was able to complete the rest of prescribed lower extremity strengthening without onset of hamstring  pain.    EVAL: Patient is a 28 y.o. female who was seen today for physical therapy evaluation and treatment for Lt knee pain and Rt shoulder pain.  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 Her Rt shoulder pain has been present for the past 5 years, but recently worsened in the fall with a blocking maneuver  while playing basketball. In regards to the Lt knee she is noted to have palpable tenderness about Lt lateral hamstring and lateral joint line, pain and weakness with Lt knee MMT, and bilateral hip weakness. In regards to the Rt shoulder she has positive impingement and bicep special testing, limited shoulder flexion and IR AROM, Rt shoulder weakness, and periscapular weakness. Patient will benefit from skilled PT to address the above stated deficits in order to optimize their function and assist in overall pain reduction.    OBJECTIVE IMPAIRMENTS: decreased activity tolerance, decreased balance, decreased endurance, decreased knowledge of condition, difficulty walking, decreased ROM, decreased strength, increased fascial restrictions, impaired flexibility, impaired UE functional use, improper body mechanics, and pain.   ACTIVITY LIMITATIONS: carrying, lifting, bending, standing, squatting, sleeping, stairs, reach over head, and locomotion level  PARTICIPATION LIMITATIONS: cleaning, laundry, shopping, community activity, and occupation  PERSONAL FACTORS: Age, Past/current experiences, Profession, and Time since onset of injury/illness/exacerbation are also affecting patient's functional outcome.   REHAB POTENTIAL: Good  CLINICAL DECISION MAKING: Evolving/moderate complexity  EVALUATION COMPLEXITY: Moderate   GOALS: Goals reviewed with patient? Yes  SHORT TERM GOALS: Target date: 01/25/2024  Patient will be independent and compliant with initial HEP.  Baseline: issued at eval Goal status: MET  2.  Patient will demonstrate at least 130 degrees of pain free Lt knee flexion AROM to improve ability to complete squatting activity.  Baseline: see above Goal status: partially met   3.  Patient will demonstrate 180 degrees of Rt shoulder flexion AROM to improve ability to block in basketball Baseline: see above Goal status: progressing   4.  Patient will demonstrate at least 80 degrees of Rt  shoulder IR AROM to improve ability to complete self-care activities.  Baseline: see above  Goal status: met    LONG TERM GOALS: Target date: 02/26/24  Patient will score >/= 5 on PSFS to signify clinically meaningful improvement in functional abilities.  Baseline:  Goal status: progressing   2.  Patient will squat without pain to improve ability to maintain defensive stance in basketball.  Baseline: Lt knee pain 01/13/24: mini squat without pain  Goal status: MET  3.  Patient will be able to run at least 10 minutes without onset of knee pain.  Baseline: unable 01/13/24: not appropriate for running given ongoing hamstring pain  Goal status: not appropriate   4.  Patient will demonstrate 5/5 LLE strength to improve stability with jumping.  Baseline: see above Goal status: progressing   5.  Patient will demonstrate 5/5 Rt shoulder strength to improve tolerance to basketball shooting/throwing.  Baseline: see above  Goal status: progressing    PLAN:  PT FREQUENCY: 2x/week  PT DURATION: 8 weeks  PLANNED INTERVENTIONS: [id number]- PT Re-evaluation, 97110-Therapeutic exercises, 97530- Therapeutic activity, [id number]- Neuromuscular re-education, 97535- Self Care, 16109- Manual therapy, [id number]- Gait training, [phone]- Aquatic Therapy, Taping,  Dry Needling, Cryotherapy, and Moist heat  PLAN FOR NEXT SESSION: review and progress HEP prn; progress hamstring strength and stretching as tolerated. Hip strengthening, shoulder/periscapular strengthening. Progress CKC as tolerated    Darika Ildefonso, PT, DPT, ATC 01/24/24 4:19 PM

## 2024-01-27 ENCOUNTER — Ambulatory Visit

## 2024-01-27 DIAGNOSIS — M25562 Pain in left knee: Secondary | ICD-10-CM

## 2024-01-27 DIAGNOSIS — M6281 Muscle weakness (generalized): Secondary | ICD-10-CM

## 2024-01-27 DIAGNOSIS — G8929 Other chronic pain: Secondary | ICD-10-CM

## 2024-01-27 NOTE — Therapy (Signed)
 OUTPATIENT PHYSICAL THERAPY TREATMENT   Patient Name: Donna Montgomery MRN: [id number] DOB:12/27/1995, 28 y.o., female Today's Date: 01/27/2024  END OF SESSION:  PT End of Session - 01/27/24 1402     Visit Number 9    Number of Visits 17    Date for PT Re-Evaluation 02/26/24    Authorization Type [employer]    Authorization Time Period 5/2-5/31/25    Authorization - Visit Number 3    Authorization - Number of Visits 4    PT Start Time 1402    PT Stop Time 1500    PT Time Calculation (min) 58 min    Activity Tolerance Patient tolerated treatment well    Behavior During Therapy WFL for tasks assessed/performed                   Past Medical History:  Diagnosis Date   Allergy    Anemia 2014   My blood tests in college said i had low iron and I was given medication. It has been low since.   Anxiety 2014   Had anxiety my whole life, but recognized in college by health professional.   Asthma    Depression 2014   Noticed symptoms in college, was put onto two different medicines. Stopped taking medication and seeked therapy. Symptoms come and go.   Ulcer 2013   Bleeding ulcer from H. Bacteria.   Past Surgical History:  Procedure Laterality Date   FRACTURE SURGERY  2017   Hand fracture   OPEN REDUCTION INTERNAL FIXATION (ORIF) HAND Left 2017   Patient Active Problem List   Diagnosis Date Noted   GAD (generalized anxiety disorder) 08/05/2023   Current moderate episode of major depressive disorder without prior episode (HCC) 08/05/2023   Acute pain of right shoulder 08/05/2023   Acute otitis externa of left ear 08/05/2023   Tinnitus of left ear 08/05/2023   Menorrhagia with regular cycle 10/24/2022   Iron deficiency anemia 10/24/2022   Depression, recurrent (HCC) 03/25/2020   Anxiety 03/25/2020   Chronic toe pain, right foot 07/27/2017   Asthma 08/29/2010   EXERCISE INDUCED ASTHMA 07/11/2010    PCP: Josepha Nickels, DO   REFERRING PROVIDER: Rodgers Clack, DO   REFERRING DIAG:  [phone] (ICD-10-CM) - Internal derangement of left knee  M25.511,G89.29 (ICD-10-CM) - Chronic right shoulder pain    THERAPY DIAG:  Acute pain of left knee  Chronic right shoulder pain  Muscle weakness (generalized)  Rationale for Evaluation and Treatment: Rehabilitation  ONSET DATE: shoulder exacerbation August 2023; knee March 2024   SUBJECTIVE:   SUBJECTIVE STATEMENT:  Patient reports the knee felt really good on Tuesday and Wednesday. Is having pain lower down in the leg today along the side of the calf.   EVAL: Patient was playing basketball in early March and the first incident of knee pain was running to block a ball and had an ultrasound and was told by the doctor that she inflamed her hamstring tendon. She kept playing and then in the same game she got a fast break and was fouled and she landed on the players foot and her knee went out to the side. She denies hearing any popping/clicking. She kept playing, but was limping. She continued to play in games, but did not practice until end of March. She worked with the PT while recovering, but didn't do a ton of strengthening. [place] and was referred to PT. The  pain is located along the lateral hamstring. She reports there is a "new sound" in the knee since her injury, but does not think it is popping/clicking. Initially she had instances of the knee giving away, but not recently. Since returning home she has been resting and relaxing with pilates being her only exercise currently.   Patient reports she is right handed and blocks a lot of shots. Her Rt shoulder has been bothering her a lot more this past year compared to previous years. It hurts to sleep on the right shoulder and feels weak when she tries to move it. She denies any popping/clicking or feelings of instability. She reports shoulder pain has been ongoing for past 5 years, but does recall blocking a shot in  August and started to have more shoulder pain after this.     PERTINENT HISTORY: ORIF Lt hand  Exercise induced asthma  PAIN:  Are you having pain? Yes: NPRS scale: 2 Pain location: Lt lateral gastroc/soleus Pain description: knot Aggravating factors:  overactivity   Relieving factors: ice,strengthening  PRECAUTIONS: None  RED FLAGS: None   WEIGHT BEARING RESTRICTIONS: No  FALLS:  Has patient fallen in last 6 months? Yes. Number of falls plays basketball (multiple falls)   LIVING ENVIRONMENT: Lives with: lives with their family Lives in: House/apartment Stairs: Yes: Internal: 15 steps; on right going up Has following equipment at home: None  OCCUPATION: professional basketball player; out of season for now  PLOF: Independent  PATIENT GOALS: "I want to be able to bend it." (Knee); "sleep on it." (Shoulder); main goal is return to basketball   NEXT MD VISIT: nothing scheduled   OBJECTIVE:  Note: Objective measures were completed at Evaluation unless otherwise noted.  DIAGNOSTIC FINDINGS:  Lt knee MRI: IMPRESSION: 1. Free edge fraying of the posterior horn and body of the lateral meniscus without discrete radial tear or displaced meniscal fragment. 2. The medial meniscus, cruciate and collateral ligaments are intact. 3. Prominent tricompartmental degenerative changes for age, most advanced in the patellofemoral and lateral compartments. No acute osseous findings. 4. Small knee joint effusion.  Rt Shoulder X-ray: IMPRESSION: Negative radiographs of the right shoulder.  PATIENT SURVEYS:  Patient-specific activity scoring scheme (Point to one number):  "0" represents "unable to perform." "10" represents "able to perform at prior level. 0 1 2 3 4 5 6 7 8 9  10 (Date and Score) Activity Initial  Activity Eval  01/13/24   Squatting   6  7  Sleeping on Rt side  0  4  Running 0 0  Jumping  0 0  Total 1.5 2.75   Additional Additional Total score = sum of the  activity scores/number of activities Minimum detectable change (90%CI) for average score = 2 points Minimum detectable change (90%CI) for single activity score = 3 points PSFS developed by: [place] Spitz., & Binkley, J. (1995). Assessing disability and change on individual  patients: a report of a patient specific measure. Physiotherapy [place], 47, [phone]. Reproduced with the permission of the authors  Score: 1.5 total    COGNITION: Overall cognitive status: Within functional limits for tasks assessed     SENSATION: Not tested  EDEMA:  No obvious swelling about the knee or shoulder   MUSCLE LENGTH: Hamstrings: Right lacking 2 deg; Left lacking 35 deg pain   POSTURE: No Significant postural limitations  PALPATION: TTP left lateral hamstring Did not palpate Rt shoulder   LOWER EXTREMITY ROM:  Active ROM Right eval  Left eval 01/05/24 Left  01/13/24 Left   Hip flexion      Hip extension      Hip abduction      Hip adduction      Hip internal rotation      Hip external rotation      Knee flexion 135 119 pain 124 pain 130 pain   Knee extension Full  Full     Ankle dorsiflexion      Ankle plantarflexion      Ankle inversion      Ankle eversion       (Blank rows = not tested)  LOWER EXTREMITY MMT:  MMT Right eval Left eval 01/13/24 Left  01/27/24 Left   Hip flexion 4+ 4 4+    Hip extension 4+ 4+ 4+   Hip abduction 4+ 4- pain  4   Hip adduction      Hip internal rotation      Hip external rotation      Knee flexion 5 4+ pain  4+ pain  5  Knee extension 4+ 4+ pain 5    Ankle dorsiflexion      Ankle plantarflexion      Ankle inversion      Ankle eversion       (Blank rows = not tested)  UPPER EXTREMITY ROM:  Active ROM Right eval Left eval 01/13/24 Right   Shoulder flexion 150 180 170  Shoulder extension     Shoulder abduction 180 pain 180 180  Shoulder adduction     Shoulder extension     Shoulder internal rotation 65 85 85   Shoulder external rotation 90 90 82 pain  Elbow flexion     Elbow extension     Wrist flexion     Wrist extension     Wrist ulnar deviation     Wrist radial deviation     Wrist pronation     Wrist supination      (Blank rows = not tested)   UPPER EXTREMITY MMT:  MMT Right eval Left eval 01/13/24 Right   Shoulder flexion 4 5 4+ pain   Shoulder extension     Shoulder abduction 5 5 5   Shoulder adduction     Shoulder extension     Shoulder internal rotation 5 5 5   Shoulder external rotation 4- pain  4 4-  Middle trapezius 4 pain 4+ 4 pain   Lower trapezius     Elbow flexion     Elbow extension     Wrist flexion     Wrist extension     Wrist ulnar deviation     Wrist radial deviation     Wrist pronation     Wrist supination     Grip strength      (Blank rows = not tested)  SPECIAL TESTS:  (+) Yergason's, Speeds, Neer's, Hawkin's Kennedy (-) Empty Can  (+) McMurray's for pain  (-) Anterior drawer, posterior drawer, valgus, varus   FUNCTIONAL TESTS:  Squat: WNL pain left lateral knee  GAIT: Distance walked: 10 ft  Assistive device utilized: None Level of assistance: Complete Independence Comments: no obvious gait abnormalities  [facility] Adult PT Treatment:                                                DATE: 01/27/24 Therapeutic Exercise: Elliptical level 1.5  x 5 minutes Calf stretch on wedge x 1 minute  Hamstring stretch with strap x 1 minute   SL calf raise x 10   Neuromuscular re-ed: HS curl on physioball 2 x 10  [place] lunge on slider x 10 each  Bridge to HS curl on sliders 2 x 10  DL mini hops fwd/bwd attempted d/u due to pain Lunge on airex 2 x 10  [place]ord[place] HS curl 2 x 5  Resisted backward walking 22.5 lbs x 10  Resisted forward walking x 10; 22.5 lbs  Resisted lateral walking x 10 each; 22.5 lbs   Modalities: Ice to Rt shoulder and Lt knee x 15 minutes    [facility] Adult PT Treatment:                                                DATE:  01/24/24  Manual Therapy: IASTM Lt hamstring Passive hamstring stretch Lt Skilled palpation of trigger points  Trigger Point Dry Needling  Subsequent Treatment: Instructions provided previously at initial dry needling treatment.   Patient Verbal Consent Given: Yes Education Handout Provided: Previously Provided Muscles Treated: Lt bicep femoris  Electrical Stimulation Performed: No Treatment Response/Outcome: twitch response elicited    Neuromuscular re-ed: SL squat 2 x 5 each  [place] hamstring 2 x 4  Squat jump x 2 d/c  Bosu squat 3 x 30 sec  3 way hip on airex x 10 each  Modalities: Ice to Rt shoulder and Lt knee x 15 minutes    [facility] Adult PT Treatment:                                                DATE: 01/17/24 Therapeutic Exercise: Recumbent bike level 3 x 5 minutes  Seated HS stretch x 30 sec   Neuromuscular re-ed: Prone I on physioball 2 x 15  Resisted IR green band 2 x 10  Resisted ER red band 2 x 10  Supine horizontal shoulder abduction green band 2 x 10  Therapeutic Activity: Deadlift  x 10 @ 10 dumbbells, x 10 @ 40 lb kettlebell  Squat x 10 @ 40 lb kettlebell  Split squat x 10 each   Modalities: Ice to Rt shoulder and Lt knee x 10 minutes    [facility] Adult PT Treatment:                                                DATE: 01/13/24  Manual Therapy: IASTM Lt hamstring  K-tape postural corrective: I strip O to I upper trap x 50% tension, I strip coracoid process to medial scapular border 50% tension, I strip coracoid process to inferior scapular angle x 50% tension  Neuromuscular re-ed: Mini squats 2 x 10  Wall sit mini 2 x 30 sec SL bridge x 10, x 8, x 10 Prone hip extension 2 x 10   Serratus wall slides 2 x 10  Therapeutic Activity: Re-assessment to determine overall progress, educating patient on progress towards goals.  Modalities: Ice to Rt shoulder and Lt knee x 10 minutes  PATIENT EDUCATION:  Education details: HEP update  Person  educated: Patient Education method: Explanation, Demonstration, Tactile cues, and Verbal cues, handout Education comprehension: verbalized understanding, returned demonstration, verbal cues required, tactile cues required, and needs further education  HOME EXERCISE PROGRAM: Access Code: [email or web address] URL: [email or web address] Date: 01/27/2024 Prepared by: Forrestine Ike  Exercises - Seated Hamstring Stretch  - 2 x daily - 7 x weekly - 3 sets - 30 sec  hold - Supine Static Chest Stretch on Foam Roll  - 2 x daily - 7 x weekly - 1 sets - 1 min hold - Sleeper Stretch  - 2 x daily - 7 x weekly - 3 sets - 30 sec  hold - Supine Scapular Protraction in Flexion with Dumbbells  - 1 x daily - 7 x weekly - 2 sets - 15 reps - Seated Row Cable Machine  - 1 x daily - 7 x weekly - 3 sets - 10 reps - Sidelying Hip Abduction  - 1 x daily - 7 x weekly - 3 sets - 10 reps - Sidelying Shoulder ER with Towel and Dumbbell  - 1 x daily - 7 x weekly - 2 sets - 10 reps - Seated Hamstring Curl with Anchored Resistance  - 1 x daily - 7 x weekly - 2 sets - 10 reps - Wall Sit  - 1 x daily - 7 x weekly - 3 sets - 30 sec  hold - Prone Hip Extension  - 1 x daily - 7 x weekly - 2 sets - 10 reps - Single Leg Bridge  - 1 x daily - 7 x weekly - 2 sets - 10 reps - Serratus Activation at Wall  - 1 x daily - 7 x weekly - 2 sets - 10 reps - [place] Deadlift With Barbell  - 1 x daily - 3 x weekly - 2 sets - 10 reps - Barbell Squat  - 1 x daily - 3 x weekly - 2 sets - 10 reps - Single Leg Lunge with Foot on Bench  - 1 x daily - 3 x weekly - 2 sets - 10 reps - Shoulder Internal Rotation with Resistance  - 1 x daily - 7 x weekly - 2 sets - 10 reps - Shoulder External Rotation with Anchored Resistance  - 1 x daily - 7 x weekly - 2 sets - 10 reps - Supine Shoulder Horizontal Abduction with Resistance  - 1 x daily - 7 x weekly - 2 sets - 10 reps - Supine Hamstring Curl on Swiss Ball  - 1 x daily - 3 x weekly - 2 sets - 10  reps - Supine Single Leg Eccentric Hamstring Bridge with Slider  - 1 x daily - 3 x weekly - 2 sets - 10 reps - Standing Single Leg Heel Raise  - 1 x daily - 3 x weekly - 3 sets - 10 reps  ASSESSMENT:  CLINICAL IMPRESSION: Patient arrives with pain localized to lateral gastroc/soleus, but no pain about hamstring. She has full and pain free hamstring strength. Able to introduce elliptical without knee pain. We focused on hamstring strength progression without onset of hamstring pain. With DL mini hops this caused immediate discomfort about bicep femoris tendon, so was discontinued. Otherwise no complaints of pain with today's strength progression.    EVAL: Patient is a 28 y.o. female who was seen today for physical therapy evaluation and treatment for Lt knee pain and Rt shoulder pain.  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 Her Rt shoulder pain has been present for the past 5 years, but recently worsened in the fall with a blocking maneuver while playing basketball. In regards to the Lt knee she is noted to have palpable tenderness about Lt lateral hamstring and lateral joint line, pain and weakness with Lt knee MMT, and bilateral hip weakness. In regards to the Rt shoulder she has positive impingement and bicep special testing, limited shoulder flexion and IR AROM, Rt shoulder weakness, and periscapular weakness. Patient will benefit from skilled PT to address the above stated deficits in order to optimize their function and assist in overall pain reduction.    OBJECTIVE IMPAIRMENTS: decreased activity tolerance, decreased balance, decreased endurance, decreased knowledge of condition, difficulty  walking, decreased ROM, decreased strength, increased fascial restrictions, impaired flexibility, impaired UE functional use, improper body mechanics, and pain.   ACTIVITY LIMITATIONS: carrying, lifting, bending, standing, squatting, sleeping, stairs, reach over head, and locomotion level  PARTICIPATION LIMITATIONS: cleaning, laundry, shopping, community activity, and occupation  PERSONAL FACTORS: Age, Past/current experiences, Profession, and Time since onset of injury/illness/exacerbation are also affecting patient's functional outcome.   REHAB POTENTIAL: Good  CLINICAL DECISION MAKING: Evolving/moderate complexity  EVALUATION COMPLEXITY: Moderate   GOALS: Goals reviewed with patient? Yes  SHORT TERM GOALS: Target date: 01/25/2024  Patient will be independent and compliant with initial HEP.  Baseline: issued at eval Goal status: MET  2.  Patient will demonstrate at least 130 degrees of pain free Lt knee flexion AROM to improve ability to complete squatting activity.  Baseline: see above Goal status: partially met   3.  Patient will demonstrate 180 degrees of Rt shoulder flexion AROM to improve ability to block in basketball Baseline: see above Goal status: progressing   4.  Patient will demonstrate at least 80 degrees of Rt shoulder IR AROM to improve ability to complete self-care activities.  Baseline: see above  Goal status: met    LONG TERM GOALS: Target date: 02/26/24  Patient will score >/= 5 on PSFS to signify clinically meaningful improvement in functional abilities.  Baseline:  Goal status: progressing   2.  Patient will squat without pain to improve ability to maintain defensive stance in basketball.  Baseline: Lt knee pain 01/13/24: mini squat without pain  Goal status: MET  3.  Patient will be able to run at least 10 minutes without onset of knee pain.  Baseline: unable 01/13/24: not appropriate for running given ongoing hamstring pain  Goal status: not  appropriate   4.  Patient will demonstrate 5/5 LLE strength to improve stability with jumping.  Baseline: see above Goal status: progressing   5.  Patient will demonstrate 5/5 Rt shoulder strength to improve tolerance to basketball shooting/throwing.  Baseline: see above  Goal status: progressing    PLAN:  PT FREQUENCY: 2x/week  PT DURATION: 8 weeks  PLANNED INTERVENTIONS: [id number]- PT Re-evaluation, 97110-Therapeutic exercises, 97530- Therapeutic activity, [id number]- Neuromuscular re-education, 97535- Self Care, [id number]- Manual therapy, [id number]- Gait training, [phone]- Aquatic Therapy, Taping, Dry Needling, Cryotherapy, and Moist heat  PLAN FOR NEXT SESSION: review and progress HEP prn; progress hamstring strength and stretching as tolerated. Hip strengthening, shoulder/periscapular strengthening. Progress CKC as tolerated; request more visits.  Leeasia Secrist, PT, DPT, ATC 01/27/24 4:59 PM

## 2024-01-31 ENCOUNTER — Ambulatory Visit: Payer: Self-pay

## 2024-01-31 DIAGNOSIS — M25562 Pain in left knee: Secondary | ICD-10-CM

## 2024-01-31 DIAGNOSIS — G8929 Other chronic pain: Secondary | ICD-10-CM

## 2024-01-31 DIAGNOSIS — M6281 Muscle weakness (generalized): Secondary | ICD-10-CM

## 2024-01-31 NOTE — Therapy (Signed)
 OUTPATIENT PHYSICAL THERAPY TREATMENT RE-CERTIFICATION   Patient Name: Donna Montgomery MRN: [id number] DOB:1996-01-04, 28 y.o., female Today's Date: 01/31/2024  END OF SESSION:  PT End of Session - 01/31/24 1404     Visit Number 10    Number of Visits 26    Date for PT Re-Evaluation 04/01/24    Authorization Type [employer]    Authorization Time Period 5/2-5/31/25    Authorization - Visit Number 4    Authorization - Number of Visits 4    PT Start Time 1402    PT Stop Time 1500    PT Time Calculation (min) 58 min    Activity Tolerance Patient tolerated treatment well    Behavior During Therapy WFL for tasks assessed/performed                    Past Medical History:  Diagnosis Date   Allergy    Anemia 2014   My blood tests in college said i had low iron and I was given medication. It has been low since.   Anxiety 2014   Had anxiety my whole life, but recognized in college by health professional.   Asthma    Depression 2014   Noticed symptoms in college, was put onto two different medicines. Stopped taking medication and seeked therapy. Symptoms come and go.   Ulcer 2013   Bleeding ulcer from H. Bacteria.   Past Surgical History:  Procedure Laterality Date   FRACTURE SURGERY  2017   Hand fracture   OPEN REDUCTION INTERNAL FIXATION (ORIF) HAND Left 2017   Patient Active Problem List   Diagnosis Date Noted   GAD (generalized anxiety disorder) 08/05/2023   Current moderate episode of major depressive disorder without prior episode (HCC) 08/05/2023   Acute pain of right shoulder 08/05/2023   Acute otitis externa of left ear 08/05/2023   Tinnitus of left ear 08/05/2023   Menorrhagia with regular cycle 10/24/2022   Iron deficiency anemia 10/24/2022   Depression, recurrent (HCC) 03/25/2020   Anxiety 03/25/2020   Chronic toe pain, right foot 07/27/2017   Asthma 08/29/2010   EXERCISE INDUCED ASTHMA 07/11/2010    PCP: Josepha Nickels, DO   REFERRING  PROVIDER: Rodgers Clack, DO   REFERRING DIAG:  [phone] (ICD-10-CM) - Internal derangement of left knee  M25.511,G89.29 (ICD-10-CM) - Chronic right shoulder pain    THERAPY DIAG:  Acute pain of left knee  Chronic right shoulder pain  Muscle weakness (generalized)  Rationale for Evaluation and Treatment: Rehabilitation  ONSET DATE: shoulder exacerbation August 2023; knee March 2024   SUBJECTIVE:   SUBJECTIVE STATEMENT:  Patient reports the knee feels good right now. Was able to able to ride her bike twice without knee issues. Still is unable to run and jump. She worked on free throw form, but didn't actually shoot. The shoulder was fatigued with practicing her form and became achy.   EVAL: Patient was playing basketball in early March and the first incident of knee pain was running to block a ball and had an ultrasound and was told by the doctor that she inflamed her hamstring tendon. She kept playing and then in the same game she got a fast break and was fouled and she landed on the players foot and her knee went out to the side. She denies hearing any popping/clicking. She kept playing, but was limping. She continued to play in games, but did not practice until end of March. She worked with the PT  while recovering, but didn't do a ton of strengthening. [place] and was referred to PT. The pain is located along the lateral hamstring. She reports there is a "new sound" in the knee since her injury, but does not think it is popping/clicking. Initially she had instances of the knee giving away, but not recently. Since returning home she has been resting and relaxing with pilates being her only exercise currently.   Patient reports she is right handed and blocks a lot of shots. Her Rt shoulder has been bothering her a lot more this past year compared to previous years. It hurts to sleep on the right shoulder and feels weak when she tries to move it. She denies  any popping/clicking or feelings of instability. She reports shoulder pain has been ongoing for past 5 years, but does recall blocking a shot in August and started to have more shoulder pain after this.     PERTINENT HISTORY: ORIF Lt hand  Exercise induced asthma  PAIN:  Are you having pain? Yes: NPRS scale: 1/10  Pain location: Lt lateral hamstring and Rt lateral shoulder Pain description: shoulder- aching; hamstring-sore Aggravating factors:  overactivity, running, jumping   Relieving factors: ice,strengthening  PRECAUTIONS: None  RED FLAGS: None   WEIGHT BEARING RESTRICTIONS: No  FALLS:  Has patient fallen in last 6 months? Yes. Number of falls plays basketball (multiple falls)   LIVING ENVIRONMENT: Lives with: lives with their family Lives in: House/apartment Stairs: Yes: Internal: 15 steps; on right going up Has following equipment at home: None  OCCUPATION: professional basketball player; out of season for now  PLOF: Independent  PATIENT GOALS: "I want to be able to bend it." (Knee); "sleep on it." (Shoulder); main goal is return to basketball   NEXT MD VISIT: nothing scheduled   OBJECTIVE:  Note: Objective measures were completed at Evaluation unless otherwise noted.  DIAGNOSTIC FINDINGS:  Lt knee MRI: IMPRESSION: 1. Free edge fraying of the posterior horn and body of the lateral meniscus without discrete radial tear or displaced meniscal fragment. 2. The medial meniscus, cruciate and collateral ligaments are intact. 3. Prominent tricompartmental degenerative changes for age, most advanced in the patellofemoral and lateral compartments. No acute osseous findings. 4. Small knee joint effusion.  Rt Shoulder X-ray: IMPRESSION: Negative radiographs of the right shoulder.  PATIENT SURVEYS:  Patient-specific activity scoring scheme (Point to one number):  "0" represents "unable to perform." "10" represents "able to perform at prior level. 0 1 2 3 4 5 6 7  8 9 10  (Date and Score) Activity Initial  Activity Eval  01/13/24  01/31/24  Squatting   6  7 10   Sleeping on Rt side  0  4 5  Running 0 0 0  Jumping  0 0 0  Total 1.5 2.75 3.75   Additional Additional Total score = sum of the activity scores/number of activities Minimum detectable change (90%CI) for average score = 2 points Minimum detectable change (90%CI) for single activity score = 3 points PSFS developed by: [place] Spitz., & Binkley, J. (1995). Assessing disability and change on individual  patients: a report of a patient specific measure. Physiotherapy [place], [phone], [phone]. Reproduced with the permission of the authors  Score: 1.5 total    COGNITION: Overall cognitive status: Within functional limits for tasks assessed     SENSATION: Not tested  EDEMA:  No obvious swelling about the knee or shoulder   MUSCLE  LENGTH: Hamstrings: Right lacking 2 deg; Left lacking 35 deg pain   POSTURE: No Significant postural limitations  PALPATION: TTP left lateral hamstring Did not palpate Rt shoulder   LOWER EXTREMITY ROM:  Active ROM Right eval Left eval 01/05/24 Left  01/13/24 Left  01/31/24 Left   Hip flexion       Hip extension       Hip abduction       Hip adduction       Hip internal rotation       Hip external rotation       Knee flexion 135 119 pain 124 pain 130 pain  131 pain   Knee extension Full  Full      Ankle dorsiflexion       Ankle plantarflexion       Ankle inversion       Ankle eversion        (Blank rows = not tested)  LOWER EXTREMITY MMT:  MMT Right eval Left eval 01/13/24 Left  01/27/24 Left  01/31/24 Left   Hip flexion 4+ 4 4+   5  Hip extension 4+ 4+ 4+  4+  Hip abduction 4+ 4- pain  4  4+  Hip adduction       Hip internal rotation       Hip external rotation       Knee flexion 5 4+ pain  4+ pain  5 5  Knee extension 4+ 4+ pain 5   5  Ankle dorsiflexion       Ankle plantarflexion       Ankle inversion        Ankle eversion        (Blank rows = not tested)  UPPER EXTREMITY ROM:  Active ROM Right eval Left eval 01/13/24 Right  01/31/24 Right   Shoulder flexion 150 180 170 175 pain  Shoulder extension      Shoulder abduction 180 pain 180 180 180 pain  Shoulder adduction      Shoulder extension      Shoulder internal rotation 65 85 85   Shoulder external rotation 90 90 82 pain   Elbow flexion      Elbow extension      Wrist flexion      Wrist extension      Wrist ulnar deviation      Wrist radial deviation      Wrist pronation      Wrist supination       (Blank rows = not tested)   UPPER EXTREMITY MMT:  MMT Right eval Left eval 01/13/24 Right  01/31/24 Right   Shoulder flexion 4 5 4+ pain  5  Shoulder extension      Shoulder abduction 5 5 5 5   Shoulder adduction      Shoulder extension      Shoulder internal rotation 5 5 5 5   Shoulder external rotation 4- pain  4 4- 4- pain  Middle trapezius 4 pain 4+ 4 pain  4 pain   Lower trapezius      Elbow flexion      Elbow extension      Wrist flexion      Wrist extension      Wrist ulnar deviation      Wrist radial deviation      Wrist pronation      Wrist supination      Grip strength       (Blank rows = not tested)  SPECIAL  TESTS:  (+) Yergason's, Speeds, Neer's, Hawkin's Kennedy (-) Empty Can  (+) McMurray's for pain  (-) Anterior drawer, posterior drawer, valgus, varus   FUNCTIONAL TESTS:  Squat: WNL pain left lateral knee  GAIT: Distance walked: 10 ft  Assistive device utilized: None Level of assistance: Complete Independence Comments: no obvious gait abnormalities  [facility] Adult PT Treatment:                                                DATE: 01/31/24 Therapeutic Exercise: Elliptical level 2 x 5 minutes HS stretch with strap x 1 minute  Manual Therapy: Skilled palpation of trigger points Trigger Point Dry Needling  Subsequent Treatment: Instructions provided previously at initial dry needling treatment.    Patient Verbal Consent Given: Yes Education Handout Provided: Previously Provided Muscles Treated: Lt gastroc/soleus  Electrical Stimulation Performed: No Treatment Response/Outcome: twitch response elicited    Neuromuscular re-ed: Step up knee driver on BOSU x 6 LLE, x 10 RLE  Prone row 10 lbs 2 x 10  Therapeutic Activity: Re-assessment to determine overall progress, educating patient on progress towards goals  Lateral lunge 1 x 10 each; 1 x 10 RLE, 1 x 6 LLE 10 lbs  Step up knee driver 2  x 10 LLE  Leg press 3 x 10 @ 60 lbs  Modalities: Ice to Rt shoulder and Lt knee x 15 minutes     [facility] Adult PT Treatment:                                                DATE: 01/27/24 Therapeutic Exercise: Elliptical level 1.5 x 5 minutes Calf stretch on wedge x 1 minute  Hamstring stretch with strap x 1 minute   SL calf raise x 10   Neuromuscular re-ed: HS curl on physioball 2 x 10  [place] lunge on slider x 10 each  Bridge to HS curl on sliders 2 x 10  DL mini hops fwd/bwd attempted d/u due to pain Lunge on airex 2 x 10  Nordic HS curl 2 x 5  Resisted backward walking 22.5 lbs x 10  Resisted forward walking x 10; 22.5 lbs  Resisted lateral walking x 10 each; 22.5 lbs   Modalities: Ice to Rt shoulder and Lt knee x 15 minutes    [facility] Adult PT Treatment:                                                DATE: 01/24/24  Manual Therapy: IASTM Lt hamstring Passive hamstring stretch Lt Skilled palpation of trigger points  Trigger Point Dry Needling  Subsequent Treatment: Instructions provided previously at initial dry needling treatment.   Patient Verbal Consent Given: Yes Education Handout Provided: Previously Provided Muscles Treated: Lt bicep femoris  Electrical Stimulation Performed: No Treatment Response/Outcome: twitch response elicited    Neuromuscular re-ed: SL squat 2 x 5 each  [place] hamstring 2 x 4  Squat jump x 2 d/c  Bosu squat 3 x 30 sec  3 way hip  on airex x 10 each  Modalities: Ice to Rt  shoulder and Lt knee x 15 minutes       PATIENT EDUCATION:  Education details: HEP update  Person educated: Patient Education method: Explanation, Demonstration, Tactile cues, and Verbal cues, handout Education comprehension: verbalized understanding, returned demonstration, verbal cues required, tactile cues required, and needs further education  HOME EXERCISE PROGRAM: Access Code: [email or web address] URL: [email or web address] Date: 01/31/2024 Prepared by: Forrestine Ike  Exercises - Seated Hamstring Stretch  - 2 x daily - 7 x weekly - 3 sets - 30 sec  hold - Supine Static Chest Stretch on Foam Roll  - 2 x daily - 7 x weekly - 1 sets - 1 min hold - Sleeper Stretch  - 2 x daily - 7 x weekly - 3 sets - 30 sec  hold - Supine Scapular Protraction in Flexion with Dumbbells  - 1 x daily - 3 x weekly - 2 sets - 15 reps - Seated Row Cable Machine  - 1 x daily - 3 x weekly - 3 sets - 10 reps - Sidelying Shoulder ER with Towel and Dumbbell  - 1 x daily - 3 x weekly - 2 sets - 10 reps - Serratus Activation at Wall  - 1 x daily - 3 x weekly - 2 sets - 10 reps - Shoulder Internal Rotation with Resistance  - 1 x daily - 3 x weekly - 2 sets - 10 reps - Shoulder External Rotation with Anchored Resistance  - 1 x daily - 3 x weekly - 2 sets - 10 reps - Supine Shoulder Horizontal Abduction with Resistance  - 1 x daily - 3 x weekly - 2 sets - 10 reps - Single Leg Bridge  - 1 x daily - 3 x weekly - 2 sets - 10 reps - Seated Hamstring Curl with Anchored Resistance  - 1 x daily - 3 x weekly - 2 sets - 10 reps - [place] Deadlift With Barbell  - 1 x daily - 3 x weekly - 2 sets - 10 reps - Barbell Squat  - 1 x daily - 3 x weekly - 2 sets - 10 reps - Single Leg Lunge with Foot on Bench  - 1 x daily - 3 x weekly - 2 sets - 10 reps - Supine Hamstring Curl on Swiss Ball  - 1 x daily - 3 x weekly - 2 sets - 10 reps - Supine Single Leg Eccentric Hamstring Bridge  with Slider  - 1 x daily - 3 x weekly - 2 sets - 10 reps - Standing Single Leg Heel Raise  - 1 x daily - 3 x weekly - 3 sets - 10 reps - Runner's Step Up/Down  - 1 x daily - 3 x weekly - 2 sets - 10 reps  ASSESSMENT:  CLINICAL IMPRESSION: [place] is making steady progress in PT for her Lt knee and Rt shoulder pain. She has been tolerating gradual hamstring strength progression well with both closed chain and open chain strengthening.  We have attempted plyometrics at most recent visits, however these activities are still pain provoking to the bicep femoris requiring continued strengthening of the hamstring in both concentric and eccentric fashion before the soft tissue can tolerate the loading of running and plyometric activity. Her Rt shoulder is responding well to postural strengthening and taping aimed at improving posture. She demonstrates improvements in Lt knee ROM, but continues to have pain at end range localized to bicep femoris tendon. She demonstrates full and pain free Lt knee MMT and improvements  in hip strength. Shoulder ROM has improved, but continues to have pain with end range flexion and abduction. Mild weakness remaining in shoulder ER and middle trap MMT.  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    EVAL: Patient is a 28 y.o. female who was seen today for physical therapy evaluation and treatment for Lt knee pain and Rt shoulder pain.  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 Her left knee pain occurred while playing professional basketball last month describing a running maneuver that injured her hamstring and then another mechanism of planting the foot and  varus movement of the knee in the same game. Her Rt shoulder pain has been present for the past 5 years, but recently worsened in the fall with a blocking maneuver while playing basketball. In regards to the Lt knee she is noted to have palpable tenderness about Lt lateral hamstring and lateral joint line, pain and weakness with Lt knee MMT, and bilateral hip weakness. In regards to the Rt shoulder she has positive impingement and bicep special testing, limited shoulder flexion and IR AROM, Rt shoulder weakness, and periscapular weakness. Patient will benefit from skilled PT to address the above stated deficits in order to optimize their function and assist in overall pain reduction.    OBJECTIVE IMPAIRMENTS: decreased activity tolerance, decreased balance, decreased endurance, decreased knowledge of condition, difficulty walking, decreased ROM, decreased strength, increased fascial restrictions, impaired flexibility, impaired UE functional use, improper body mechanics, and pain.   ACTIVITY LIMITATIONS: carrying, lifting, bending, standing, squatting, sleeping, stairs, reach over head, and locomotion level  PARTICIPATION LIMITATIONS: cleaning, laundry, shopping, community activity, and occupation  PERSONAL FACTORS: Age, Past/current experiences, Profession, and Time since onset of injury/illness/exacerbation are also affecting patient's functional outcome.   REHAB POTENTIAL: Good  CLINICAL DECISION MAKING: Evolving/moderate complexity  EVALUATION COMPLEXITY: Moderate   GOALS: Goals reviewed with patient? Yes  SHORT TERM GOALS: Target date: 01/25/2024  Patient will be independent and compliant with initial HEP.  Baseline: issued at eval Goal status: MET  2.  Patient will demonstrate at least 130 degrees of pain free Lt knee flexion AROM to improve ability to complete squatting activity.  Baseline: see above Goal status: partially met   3.  Patient will demonstrate 180 degrees of Rt  shoulder flexion AROM to improve ability to block in basketball Baseline: see above Goal status: nearly met   4.  Patient will demonstrate at least 80 degrees of Rt shoulder IR AROM to improve ability to complete self-care activities.  Baseline: see above  Goal status: met    LONG TERM GOALS: Target date: 04/01/2024    Patient will score >/= 5 on PSFS to signify clinically meaningful improvement in functional abilities.  Baseline:  Goal status: progressing   2.  Patient will squat without pain to improve ability to maintain defensive stance in basketball.  Baseline: Lt knee pain 01/13/24: mini squat without pain  Goal status: MET  3.  Patient will be able to run at least 10 minutes without onset of knee pain.  Baseline: unable 01/13/24: not appropriate for running given ongoing hamstring pain  01/31/24: pain with gentle plyo  Goal status: progressing as appropriate   4.  Patient will demonstrate 5/5 LLE strength to improve stability with jumping.  Baseline: see above Goal status: partially met   5.  Patient will demonstrate 5/5 Rt shoulder strength to improve tolerance to basketball shooting/throwing.  Baseline: see above  Goal status: progressing   6. Patient will be able to complete jumping and hopping activity (SL and DL) without Lt knee pain in order to rebound and shoot basketball.   Baseline: unable  Goal Status: NEW   PLAN:  PT FREQUENCY: 2x/week  PT DURATION: 8 weeks  PLANNED INTERVENTIONS: [id number]- PT Re-evaluation, 97110-Therapeutic exercises, 97530- Therapeutic activity, [id number]- Neuromuscular re-education, 97535- Self Care, 40102- Manual therapy, [id number]- Gait training, [phone]- Aquatic Therapy, Taping, Dry Needling, Cryotherapy, and Moist heat  PLAN FOR NEXT SESSION: review and progress HEP prn; progress hamstring strength and stretching as tolerated. Hip strengthening, shoulder/periscapular strengthening. Progress CKC as tolerated; progress into running and plyo as  pain allows.    Anayansi Rundquist, PT, DPT, ATC 01/31/24 4:12 PM

## 2024-02-03 ENCOUNTER — Ambulatory Visit

## 2024-02-09 ENCOUNTER — Ambulatory Visit

## 2024-02-09 DIAGNOSIS — M25562 Pain in left knee: Secondary | ICD-10-CM

## 2024-02-09 DIAGNOSIS — G8929 Other chronic pain: Secondary | ICD-10-CM

## 2024-02-09 DIAGNOSIS — M6281 Muscle weakness (generalized): Secondary | ICD-10-CM

## 2024-02-09 NOTE — Therapy (Signed)
 OUTPATIENT PHYSICAL THERAPY TREATMENT    Patient Name: Donna Montgomery MRN: [id number] DOB:Dec 30, 1995, 28 y.o., female Today's Date: 02/09/2024  END OF SESSION:  PT End of Session - 02/09/24 1403     Visit Number 11    Number of Visits 26    Date for PT Re-Evaluation 04/01/24    Authorization Type [employer]    Authorization - Visit Number 1    Authorization - Number of Visits 4    PT Start Time 1403    PT Stop Time 1500    PT Time Calculation (min) 57 min    Activity Tolerance Patient tolerated treatment well    Behavior During Therapy WFL for tasks assessed/performed                    Past Medical History:  Diagnosis Date   Allergy    Anemia 2014   My blood tests in college said i had low iron and I was given medication. It has been low since.   Anxiety 2014   Had anxiety my whole life, but recognized in college by health professional.   Asthma    Depression 2014   Noticed symptoms in college, was put onto two different medicines. Stopped taking medication and seeked therapy. Symptoms come and go.   Ulcer 2013   Bleeding ulcer from H. Bacteria.   Past Surgical History:  Procedure Laterality Date   FRACTURE SURGERY  2017   Hand fracture   OPEN REDUCTION INTERNAL FIXATION (ORIF) HAND Left 2017   Patient Active Problem List   Diagnosis Date Noted   GAD (generalized anxiety disorder) 08/05/2023   Current moderate episode of major depressive disorder without prior episode (HCC) 08/05/2023   Acute pain of right shoulder 08/05/2023   Acute otitis externa of left ear 08/05/2023   Tinnitus of left ear 08/05/2023   Menorrhagia with regular cycle 10/24/2022   Iron deficiency anemia 10/24/2022   Depression, recurrent (HCC) 03/25/2020   Anxiety 03/25/2020   Chronic toe pain, right foot 07/27/2017   Asthma 08/29/2010   EXERCISE INDUCED ASTHMA 07/11/2010    PCP: Josepha Nickels, DO   REFERRING PROVIDER: Rodgers Clack, DO   REFERRING DIAG:  [phone]  (ICD-10-CM) - Internal derangement of left knee  M25.511,G89.29 (ICD-10-CM) - Chronic right shoulder pain    THERAPY DIAG:  Acute pain of left knee  Chronic right shoulder pain  Muscle weakness (generalized)  Rationale for Evaluation and Treatment: Rehabilitation  ONSET DATE: shoulder exacerbation August 2023; knee March 2024   SUBJECTIVE:   SUBJECTIVE STATEMENT:  Patient reports the knee and the shoulder were hurting last week, but feel good today. No pain right now.   EVAL: Patient was playing basketball in early March and the first incident of knee pain was running to block a ball and had an ultrasound and was told by the doctor that she inflamed her hamstring tendon. She kept playing and then in the same game she got a fast break and was fouled and she landed on the players foot and her knee went out to the side. She denies hearing any popping/clicking. She kept playing, but was limping. She continued to play in games, but did not practice until end of March. She worked with the PT while recovering, but didn't do a ton of strengthening. [place] and was referred to PT. The pain is located along the lateral hamstring. She reports there is a "  new sound" in the knee since her injury, but does not think it is popping/clicking. Initially she had instances of the knee giving away, but not recently. Since returning home she has been resting and relaxing with pilates being her only exercise currently.   Patient reports she is right handed and blocks a lot of shots. Her Rt shoulder has been bothering her a lot more this past year compared to previous years. It hurts to sleep on the right shoulder and feels weak when she tries to move it. She denies any popping/clicking or feelings of instability. She reports shoulder pain has been ongoing for past 5 years, but does recall blocking a shot in August and started to have more shoulder pain after this.      PERTINENT HISTORY: ORIF Lt hand  Exercise induced asthma  PAIN:  Are you having pain? No  PRECAUTIONS: None  RED FLAGS: None   WEIGHT BEARING RESTRICTIONS: No  FALLS:  Has patient fallen in last 6 months? Yes. Number of falls plays basketball (multiple falls)   LIVING ENVIRONMENT: Lives with: lives with their family Lives in: House/apartment Stairs: Yes: Internal: 15 steps; on right going up Has following equipment at home: None  OCCUPATION: professional basketball player; out of season for now  PLOF: Independent  PATIENT GOALS: "I want to be able to bend it." (Knee); "sleep on it." (Shoulder); main goal is return to basketball   NEXT MD VISIT: nothing scheduled   OBJECTIVE:  Note: Objective measures were completed at Evaluation unless otherwise noted.  DIAGNOSTIC FINDINGS:  Lt knee MRI: IMPRESSION: 1. Free edge fraying of the posterior horn and body of the lateral meniscus without discrete radial tear or displaced meniscal fragment. 2. The medial meniscus, cruciate and collateral ligaments are intact. 3. Prominent tricompartmental degenerative changes for age, most advanced in the patellofemoral and lateral compartments. No acute osseous findings. 4. Small knee joint effusion.  Rt Shoulder X-ray: IMPRESSION: Negative radiographs of the right shoulder.  PATIENT SURVEYS:  Patient-specific activity scoring scheme (Point to one number):  "0" represents "unable to perform." "10" represents "able to perform at prior level. 0 1 2 3 4 5 6 7 8 9  10 (Date and Score) Activity Initial  Activity Eval  01/13/24  01/31/24  Squatting   6  7 10   Sleeping on Rt side  0  4 5  Running 0 0 0  Jumping  0 0 0  Total 1.5 2.75 3.75   Additional Additional Total score = sum of the activity scores/number of activities Minimum detectable change (90%CI) for average score = 2 points Minimum detectable change (90%CI) for single activity score = 3 points PSFS developed by:  [place] Spitz., & Binkley, J. (1995). Assessing disability and change on individual  patients: a report of a patient specific measure. Physiotherapy [place], 47, [phone]. Reproduced with the permission of the authors  Score: 1.5 total    COGNITION: Overall cognitive status: Within functional limits for tasks assessed     SENSATION: Not tested  EDEMA:  No obvious swelling about the knee or shoulder   MUSCLE LENGTH: Hamstrings: Right lacking 2 deg; Left lacking 35 deg pain   POSTURE: No Significant postural limitations  PALPATION: TTP left lateral hamstring Did not palpate Rt shoulder   LOWER EXTREMITY ROM:  Active ROM Right eval Left eval 01/05/24 Left  01/13/24 Left  01/31/24 Left   Hip flexion       Hip extension  Hip abduction       Hip adduction       Hip internal rotation       Hip external rotation       Knee flexion 135 119 pain 124 pain 130 pain  131 pain   Knee extension Full  Full      Ankle dorsiflexion       Ankle plantarflexion       Ankle inversion       Ankle eversion        (Blank rows = not tested)  LOWER EXTREMITY MMT:  MMT Right eval Left eval 01/13/24 Left  01/27/24 Left  01/31/24 Left   Hip flexion 4+ 4 4+   5  Hip extension 4+ 4+ 4+  4+  Hip abduction 4+ 4- pain  4  4+  Hip adduction       Hip internal rotation       Hip external rotation       Knee flexion 5 4+ pain  4+ pain  5 5  Knee extension 4+ 4+ pain 5   5  Ankle dorsiflexion       Ankle plantarflexion       Ankle inversion       Ankle eversion        (Blank rows = not tested)  UPPER EXTREMITY ROM:  Active ROM Right eval Left eval 01/13/24 Right  01/31/24 Right   Shoulder flexion 150 180 170 175 pain  Shoulder extension      Shoulder abduction 180 pain 180 180 180 pain  Shoulder adduction      Shoulder extension      Shoulder internal rotation 65 85 85   Shoulder external rotation 90 90 82 pain   Elbow flexion      Elbow extension       Wrist flexion      Wrist extension      Wrist ulnar deviation      Wrist radial deviation      Wrist pronation      Wrist supination       (Blank rows = not tested)   UPPER EXTREMITY MMT:  MMT Right eval Left eval 01/13/24 Right  01/31/24 Right   Shoulder flexion 4 5 4+ pain  5  Shoulder extension      Shoulder abduction 5 5 5 5   Shoulder adduction      Shoulder extension      Shoulder internal rotation 5 5 5 5   Shoulder external rotation 4- pain  4 4- 4- pain  Middle trapezius 4 pain 4+ 4 pain  4 pain   Lower trapezius      Elbow flexion      Elbow extension      Wrist flexion      Wrist extension      Wrist ulnar deviation      Wrist radial deviation      Wrist pronation      Wrist supination      Grip strength       (Blank rows = not tested)  SPECIAL TESTS:  (+) Yergason's, Speeds, Neer's, Hawkin's Kennedy (-) Empty Can  (+) McMurray's for pain  (-) Anterior drawer, posterior drawer, valgus, varus   FUNCTIONAL TESTS:  Squat: WNL pain left lateral knee  GAIT: Distance walked: 10 ft  Assistive device utilized: None Level of assistance: Complete Independence Comments: no obvious gait abnormalities  [facility] Adult PT Treatment:  DATE: 02/09/24 Therapeutic Exercise: Dynamic warm-up: frankensteins, walking hamstring stretch, walking quad stretch, walking figure 4 stretch   Neuromuscular re-ed: Prone I on physioball 2 x 15  SL RDL 2 x 10  Mountain climbers on slider 2 x 30  Body blade RUE elbow at 90, palm neutral and pronated 2 x 20 sec each  Wall ball circles CW/CCW flexion 2 x 20 each  Therapeutic Activity: Squat to calf raise to squat 2 x 10  Jump landing from aerobic stepper x 3  DL jump to SL stance onto the RLE 2 x 10  Kettlebell swing 2 x 10; 15 lbs  Fwd bounding landing on LLE x 10, x 3 landing on RLE  Modalities: Ice to Rt shoulder and Lt knee x 15 minutes     [facility] Adult PT Treatment:                                                 DATE: 01/31/24 Therapeutic Exercise: Elliptical level 2 x 5 minutes HS stretch with strap x 1 minute  Manual Therapy: Skilled palpation of trigger points Trigger Point Dry Needling  Subsequent Treatment: Instructions provided previously at initial dry needling treatment.   Patient Verbal Consent Given: Yes Education Handout Provided: Previously Provided Muscles Treated: Lt gastroc/soleus  Electrical Stimulation Performed: No Treatment Response/Outcome: twitch response elicited    Neuromuscular re-ed: Step up knee driver on BOSU x 6 LLE, x 10 RLE  Prone row 10 lbs 2 x 10  Therapeutic Activity: Re-assessment to determine overall progress, educating patient on progress towards goals  Lateral lunge 1 x 10 each; 1 x 10 RLE, 1 x 6 LLE 10 lbs  Step up knee driver 2  x 10 LLE  Leg press 3 x 10 @ 60 lbs  Modalities: Ice to Rt shoulder and Lt knee x 15 minutes     [facility] Adult PT Treatment:                                                DATE: 01/27/24 Therapeutic Exercise: Elliptical level 1.5 x 5 minutes Calf stretch on wedge x 1 minute  Hamstring stretch with strap x 1 minute   SL calf raise x 10   Neuromuscular re-ed: HS curl on physioball 2 x 10  [place] lunge on slider x 10 each  Bridge to HS curl on sliders 2 x 10  DL mini hops fwd/bwd attempted d/u due to pain Lunge on airex 2 x 10  Nordic HS curl 2 x 5  Resisted backward walking 22.5 lbs x 10  Resisted forward walking x 10; 22.5 lbs  Resisted lateral walking x 10 each; 22.5 lbs   Modalities: Ice to Rt shoulder and Lt knee x 15 minutes    [facility] Adult PT Treatment:                                                DATE: 01/24/24  Manual Therapy: IASTM Lt hamstring Passive hamstring stretch Lt Skilled palpation of trigger points  Trigger Point Dry Needling  Subsequent Treatment:  Instructions provided previously at initial dry needling treatment.   Patient Verbal Consent Given:  Yes Education Handout Provided: Previously Provided Muscles Treated: Lt bicep femoris  Electrical Stimulation Performed: No Treatment Response/Outcome: twitch response elicited    Neuromuscular re-ed: SL squat 2 x 5 each  [place] hamstring 2 x 4  Squat jump x 2 d/c  Bosu squat 3 x 30 sec  3 way hip on airex x 10 each  Modalities: Ice to Rt shoulder and Lt knee x 15 minutes       PATIENT EDUCATION:  Education details: HEP update, verbal update to try reformer squatting and jumping  Person educated: Patient Education method: Explanation, Demonstration, Tactile cues, and Verbal cues, handout Education comprehension: verbalized understanding, returned demonstration, verbal cues required, tactile cues required, and needs further education  HOME EXERCISE PROGRAM: Access Code: [email or web address] URL: [email or web address] Date: 02/09/2024 Prepared by: Forrestine Ike  Exercises - Seated Hamstring Stretch  - 2 x daily - 7 x weekly - 3 sets - 30 sec  hold - Supine Static Chest Stretch on Foam Roll  - 2 x daily - 7 x weekly - 1 sets - 1 min hold - Sleeper Stretch  - 2 x daily - 7 x weekly - 3 sets - 30 sec  hold - Supine Scapular Protraction in Flexion with Dumbbells  - 1 x daily - 3 x weekly - 2 sets - 15 reps - Seated Row Cable Machine  - 1 x daily - 3 x weekly - 3 sets - 10 reps - Sidelying Shoulder ER with Towel and Dumbbell  - 1 x daily - 3 x weekly - 2 sets - 10 reps - Serratus Activation at Wall  - 1 x daily - 3 x weekly - 2 sets - 10 reps - Shoulder Internal Rotation with Resistance  - 1 x daily - 3 x weekly - 2 sets - 10 reps - Shoulder External Rotation with Anchored Resistance  - 1 x daily - 3 x weekly - 2 sets - 10 reps - Supine Shoulder Horizontal Abduction with Resistance  - 1 x daily - 3 x weekly - 2 sets - 10 reps - Single Leg Bridge  - 1 x daily - 3 x weekly - 2 sets - 10 reps - Seated Hamstring Curl with Anchored Resistance  - 1 x daily - 3 x weekly - 2 sets - 10  reps - [place] Deadlift With Barbell  - 1 x daily - 3 x weekly - 2 sets - 10 reps - Barbell Squat  - 1 x daily - 3 x weekly - 2 sets - 10 reps - Single Leg Lunge with Foot on Bench  - 1 x daily - 3 x weekly - 2 sets - 10 reps - Supine Hamstring Curl on Swiss Ball  - 1 x daily - 3 x weekly - 2 sets - 10 reps - Supine Single Leg Eccentric Hamstring Bridge with Slider  - 1 x daily - 3 x weekly - 2 sets - 10 reps - Standing Single Leg Heel Raise  - 1 x daily - 3 x weekly - 3 sets - 10 reps - Runner's Step Up/Down  - 1 x daily - 3 x weekly - 2 sets - 10 reps - Single-Leg [place] Deadlift With Kettlebell  - 1 x daily - 3 x weekly - 3 sets - 10 reps - [employer] Fast  - 1 x daily - 3 x weekly - 2 sets - 10 reps -  Pike on [employer]  - 1 x daily - 3 x weekly - 2 sets - 10 reps - Jump to Single Leg Stance  - 1 x daily - 3 x weekly - 2 sets - 10 reps - Standing Wall [employer] with Mini Swiss Ball  - 1 x daily - 3 x weekly - 3 sets - 20 reps  ASSESSMENT:  CLINICAL IMPRESSION: Patient arrives without reports of pain. Focused on quick changes in concentric/eccentric phases of knee strengthening without onset of pain. Able to initiate jump landing from aerobic stepper with patient able to complete 3 jumps prior to onset of Lt lateral knee pain. She is able to complete DL jump take off on the LLE and land on the RLE without onset of knee pain. Able to land bounding on the LLE, but take-off on LLE caused pain and was discontinued.    EVAL: Patient is a 28 y.o. female who was seen today for physical therapy evaluation and treatment for Lt knee pain and Rt shoulder pain.  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 Her left knee pain occurred while playing professional basketball last month describing a running maneuver that injured her hamstring and then another mechanism of planting the foot and varus movement of the knee in the  same game. Her Rt shoulder pain has been present for the past 5 years, but recently worsened in the fall with a blocking maneuver while playing basketball. In regards to the Lt knee she is noted to have palpable tenderness about Lt lateral hamstring and lateral joint line, pain and weakness with Lt knee MMT, and bilateral hip weakness. In regards to the Rt shoulder she has positive impingement and bicep special testing, limited shoulder flexion and IR AROM, Rt shoulder weakness, and periscapular weakness. Patient will benefit from skilled PT to address the above stated deficits in order to optimize their function and assist in overall pain reduction.    OBJECTIVE IMPAIRMENTS: decreased activity tolerance, decreased balance, decreased endurance, decreased knowledge of condition, difficulty walking, decreased ROM, decreased strength, increased fascial restrictions, impaired flexibility, impaired UE functional use, improper body mechanics, and pain.   ACTIVITY LIMITATIONS: carrying, lifting, bending, standing, squatting, sleeping, stairs, reach over head, and locomotion level  PARTICIPATION LIMITATIONS: cleaning, laundry, shopping, community activity, and occupation  PERSONAL FACTORS: Age, Past/current experiences, Profession, and Time since onset of injury/illness/exacerbation are also affecting patient's functional outcome.   REHAB POTENTIAL: Good  CLINICAL DECISION MAKING: Evolving/moderate complexity  EVALUATION COMPLEXITY: Moderate   GOALS: Goals reviewed with patient? Yes  SHORT TERM GOALS: Target date: 01/25/2024  Patient will be independent and compliant with initial HEP.  Baseline: issued at eval Goal status: MET  2.  Patient will demonstrate at least 130 degrees of pain free Lt knee flexion AROM to improve ability to complete squatting activity.  Baseline: see above Goal status: partially met   3.  Patient will demonstrate 180 degrees of Rt shoulder flexion AROM to improve  ability to block in basketball Baseline: see above Goal status: nearly met   4.  Patient will demonstrate at least 80 degrees of Rt shoulder IR AROM to improve ability to complete self-care activities.  Baseline: see above  Goal status: met    LONG TERM GOALS: Target date: 04/01/2024    Patient will score >/= 5 on PSFS to signify clinically meaningful improvement in functional abilities.  Baseline:  Goal status: progressing  2.  Patient will squat without pain to improve ability to maintain defensive stance in basketball.  Baseline: Lt knee pain 01/13/24: mini squat without pain  Goal status: MET  3.  Patient will be able to run at least 10 minutes without onset of knee pain.  Baseline: unable 01/13/24: not appropriate for running given ongoing hamstring pain  01/31/24: pain with gentle plyo  Goal status: progressing as appropriate   4.  Patient will demonstrate 5/5 LLE strength to improve stability with jumping.  Baseline: see above Goal status: partially met   5.  Patient will demonstrate 5/5 Rt shoulder strength to improve tolerance to basketball shooting/throwing.  Baseline: see above  Goal status: progressing   6. Patient will be able to complete jumping and hopping activity (SL and DL) without Lt knee pain in order to rebound and shoot basketball.   Baseline: unable  Goal Status: NEW   PLAN:  PT FREQUENCY: 2x/week  PT DURATION: 8 weeks  PLANNED INTERVENTIONS: [id number]- PT Re-evaluation, 97110-Therapeutic exercises, 97530- Therapeutic activity, [id number]- Neuromuscular re-education, 97535- Self Care, [id number]- Manual therapy, [id number]- Gait training, [phone]- Aquatic Therapy, Taping, Dry Needling, Cryotherapy, and Moist heat  PLAN FOR NEXT SESSION: review and progress HEP prn; progress hamstring strength and stretching as tolerated. Hip strengthening, shoulder/periscapular strengthening. Progress CKC as tolerated; progress into running and plyo as pain allows.    Brayam Boeke,  PT, DPT, ATC 02/09/24 3:33 PM

## 2024-02-16 ENCOUNTER — Ambulatory Visit: Attending: Family Medicine

## 2024-02-16 DIAGNOSIS — G8929 Other chronic pain: Secondary | ICD-10-CM

## 2024-02-16 DIAGNOSIS — M6281 Muscle weakness (generalized): Secondary | ICD-10-CM

## 2024-02-16 DIAGNOSIS — M25511 Pain in right shoulder: Secondary | ICD-10-CM | POA: Insufficient documentation

## 2024-02-16 DIAGNOSIS — M25562 Pain in left knee: Secondary | ICD-10-CM

## 2024-02-16 NOTE — Therapy (Signed)
 OUTPATIENT PHYSICAL THERAPY TREATMENT    Patient Name: Donna Montgomery MRN: [id number] DOB:[birth year], 28 y.o., female Today's Date: 02/16/2024  END OF SESSION:  PT End of Session - 02/16/24 1402     Visit Number 12    Number of Visits 26    Date for PT Re-Evaluation 04/01/24    Authorization Type [employer]    Authorization - Visit Number 2    Authorization - Number of Visits 4    PT Start Time 1402    PT Stop Time 1500    PT Time Calculation (min) 58 min    Activity Tolerance Patient tolerated treatment well    Behavior During Therapy WFL for tasks assessed/performed                     Past Medical History:  Diagnosis Date   Allergy    Anemia 2014   My blood tests in college said i had low iron and I was given medication. It has been low since.   Anxiety 2014   Had anxiety my whole life, but recognized in college by health professional.   Asthma    Depression 2014   Noticed symptoms in college, was put onto two different medicines. Stopped taking medication and seeked therapy. Symptoms come and go.   Ulcer 2013   Bleeding ulcer from H. Bacteria.   Past Surgical History:  Procedure Laterality Date   FRACTURE SURGERY  2017   Hand fracture   OPEN REDUCTION INTERNAL FIXATION (ORIF) HAND Left 2017   Patient Active Problem List   Diagnosis Date Noted   GAD (generalized anxiety disorder) 08/05/2023   Current moderate episode of major depressive disorder without prior episode (HCC) 08/05/2023   Acute pain of right shoulder 08/05/2023   Acute otitis externa of left ear 08/05/2023   Tinnitus of left ear 08/05/2023   Menorrhagia with regular cycle 10/24/2022   Iron deficiency anemia 10/24/2022   Depression, recurrent (HCC) 03/25/2020   Anxiety 03/25/2020   Chronic toe pain, right foot 07/27/2017   Asthma 08/29/2010   EXERCISE INDUCED ASTHMA 07/11/2010    PCP: Josepha Nickels, DO   REFERRING PROVIDER: Rodgers Clack, DO   REFERRING DIAG:  [phone]  (ICD-10-CM) - Internal derangement of left knee  M25.511,G89.29 (ICD-10-CM) - Chronic right shoulder pain    THERAPY DIAG:  Acute pain of left knee  Chronic right shoulder pain  Muscle weakness (generalized)  Rationale for Evaluation and Treatment: Rehabilitation  ONSET DATE: shoulder exacerbation August 2023; knee March 2024   SUBJECTIVE:   SUBJECTIVE STATEMENT:  Patient reports the knee is good today. There was one day last week that she took a rest day because the hamstring was bothering her from prolonged standing. Patient reports the lateral shoulder was sore yesterday, but is better today.   EVAL: Patient was playing basketball in early March and the first incident of knee pain was running to block a ball and had an ultrasound and was told by the doctor that she inflamed her hamstring tendon. She kept playing and then in the same game she got a fast break and was fouled and she landed on the players foot and her knee went out to the side. She denies hearing any popping/clicking. She kept playing, but was limping. She continued to play in games, but did not practice until end of March. She worked with the PT while recovering, but didn't do a ton of strengthening. [place] and was referred to PT. The pain is located along the lateral hamstring. She reports there is a "new sound" in the knee since her injury, but does not think it is popping/clicking. Initially she had instances of the knee giving away, but not recently. Since returning home she has been resting and relaxing with pilates being her only exercise currently.   Patient reports she is right handed and blocks a lot of shots. Her Rt shoulder has been bothering her a lot more this past year compared to previous years. It hurts to sleep on the right shoulder and feels weak when she tries to move it. She denies any popping/clicking or feelings of instability. She reports shoulder pain has been  ongoing for past 5 years, but does recall blocking a shot in August and started to have more shoulder pain after this.     PERTINENT HISTORY: ORIF Lt hand  Exercise induced asthma  PAIN:  Are you having pain? No  PRECAUTIONS: None  RED FLAGS: None   WEIGHT BEARING RESTRICTIONS: No  FALLS:  Has patient fallen in last 6 months? Yes. Number of falls plays basketball (multiple falls)   LIVING ENVIRONMENT: Lives with: lives with their family Lives in: House/apartment Stairs: Yes: Internal: 15 steps; on right going up Has following equipment at home: None  OCCUPATION: professional basketball player; out of season for now  PLOF: Independent  PATIENT GOALS: "I want to be able to bend it." (Knee); "sleep on it." (Shoulder); main goal is return to basketball   NEXT MD VISIT: nothing scheduled   OBJECTIVE:  Note: Objective measures were completed at Evaluation unless otherwise noted.  DIAGNOSTIC FINDINGS:  Lt knee MRI: IMPRESSION: 1. Free edge fraying of the posterior horn and body of the lateral meniscus without discrete radial tear or displaced meniscal fragment. 2. The medial meniscus, cruciate and collateral ligaments are intact. 3. Prominent tricompartmental degenerative changes for age, most advanced in the patellofemoral and lateral compartments. No acute osseous findings. 4. Small knee joint effusion.  Rt Shoulder X-ray: IMPRESSION: Negative radiographs of the right shoulder.  PATIENT SURVEYS:  Patient-specific activity scoring scheme (Point to one number):  "0" represents "unable to perform." "10" represents "able to perform at prior level. 0 1 2 3 4 5 6 7 8 9  10 (Date and Score) Activity Initial  Activity Eval  01/13/24  01/31/24  Squatting   6  7 10   Sleeping on Rt side  0  4 5  Running 0 0 0  Jumping  0 0 0  Total 1.5 2.75 3.75   Additional Additional Total score = sum of the activity scores/number of activities Minimum detectable change (90%CI)  for average score = 2 points Minimum detectable change (90%CI) for single activity score = 3 points PSFS developed by: [place] Spitz., & Binkley, J. (1995). Assessing disability and change on individual  patients: a report of a patient specific measure. Physiotherapy [place], [phone], [phone]. Reproduced with the permission of the authors  Score: 1.5 total    COGNITION: Overall cognitive status: Within functional limits for tasks assessed     SENSATION: Not tested  EDEMA:  No obvious swelling about the knee or shoulder   MUSCLE LENGTH: Hamstrings: Right lacking 2 deg; Left lacking 35 deg pain   POSTURE: No Significant postural limitations  PALPATION: TTP left lateral hamstring Did not palpate Rt shoulder   LOWER EXTREMITY ROM:  Active ROM Right eval Left eval 01/05/24 Left  01/13/24 Left  01/31/24 Left   Hip flexion       Hip extension       Hip abduction       Hip adduction       Hip internal rotation       Hip external rotation       Knee flexion 135 119 pain 124 pain 130 pain  131 pain   Knee extension Full  Full      Ankle dorsiflexion       Ankle plantarflexion       Ankle inversion       Ankle eversion        (Blank rows = not tested)  LOWER EXTREMITY MMT:  MMT Right eval Left eval 01/13/24 Left  01/27/24 Left  01/31/24 Left   Hip flexion 4+ 4 4+   5  Hip extension 4+ 4+ 4+  4+  Hip abduction 4+ 4- pain  4  4+  Hip adduction       Hip internal rotation       Hip external rotation       Knee flexion 5 4+ pain  4+ pain  5 5  Knee extension 4+ 4+ pain 5   5  Ankle dorsiflexion       Ankle plantarflexion       Ankle inversion       Ankle eversion        (Blank rows = not tested)  UPPER EXTREMITY ROM:  Active ROM Right eval Left eval 01/13/24 Right  01/31/24 Right   Shoulder flexion 150 180 170 175 pain  Shoulder extension      Shoulder abduction 180 pain 180 180 180 pain  Shoulder adduction      Shoulder extension       Shoulder internal rotation 65 85 85   Shoulder external rotation 90 90 82 pain   Elbow flexion      Elbow extension      Wrist flexion      Wrist extension      Wrist ulnar deviation      Wrist radial deviation      Wrist pronation      Wrist supination       (Blank rows = not tested)   UPPER EXTREMITY MMT:  MMT Right eval Left eval 01/13/24 Right  01/31/24 Right   Shoulder flexion 4 5 4+ pain  5  Shoulder extension      Shoulder abduction 5 5 5 5   Shoulder adduction      Shoulder extension      Shoulder internal rotation 5 5 5 5   Shoulder external rotation 4- pain  4 4- 4- pain  Middle trapezius 4 pain 4+ 4 pain  4 pain   Lower trapezius      Elbow flexion      Elbow extension      Wrist flexion      Wrist extension      Wrist ulnar deviation      Wrist radial deviation      Wrist pronation      Wrist supination      Grip strength       (Blank rows = not tested)  SPECIAL TESTS:  (+) Yergason's, Speeds, Neer's, Hawkin's Kennedy (-) Empty Can  (+) McMurray's for pain  (-) Anterior drawer, posterior drawer, valgus, varus   FUNCTIONAL TESTS:  Squat: WNL pain left lateral knee  GAIT: Distance walked: 10 ft  Assistive device  utilized: None Level of assistance: Complete Independence Comments: no obvious gait abnormalities  [facility] Adult PT Treatment:                                                DATE: 02/16/24 Therapeutic Exercise: Dynamic warm-up: frankensteins, walking hamstring stretch, walking quad stretch, lateral shuffle, high knees  Manual Therapy: IASTM Lt hamstring  K-tape postural corrective Rt shoulder I strip O to I upper trap, coracoid process to inferior scapular angle, coracoid process to medial scapula border 50% tension  Neuromuscular re-ed: Resisted kickback cable machine 2 x 10 @ 5 lbs  Step up knee driver to triple extension  x 10; 6 inch step + airex  Therapeutic Activity: Squat jump 2 x 10  Jog x 100 ft  Walking lunge 2 x 20 ft  A-skip x 50  ft  Modalities: Ice to Rt shoulder and Lt knee x 10 minutes    [facility] Adult PT Treatment:                                                DATE: 02/09/24 Therapeutic Exercise: Dynamic warm-up: frankensteins, walking hamstring stretch, walking quad stretch, walking figure 4 stretch   Neuromuscular re-ed: Prone I on physioball 2 x 15  SL RDL 2 x 10  Mountain climbers on slider 2 x 30  Body blade RUE elbow at 90, palm neutral and pronated 2 x 20 sec each  Wall ball circles CW/CCW flexion 2 x 20 each  Therapeutic Activity: Squat to calf raise to squat 2 x 10  Jump landing from aerobic stepper x 3  DL jump to SL stance onto the RLE 2 x 10  Kettlebell swing 2 x 10; 15 lbs  Fwd bounding landing on LLE x 10, x 3 landing on RLE  Modalities: Ice to Rt shoulder and Lt knee x 15 minutes     [facility] Adult PT Treatment:                                                DATE: 01/31/24 Therapeutic Exercise: Elliptical level 2 x 5 minutes HS stretch with strap x 1 minute  Manual Therapy: Skilled palpation of trigger points Trigger Point Dry Needling  Subsequent Treatment: Instructions provided previously at initial dry needling treatment.   Patient Verbal Consent Given: Yes Education Handout Provided: Previously Provided Muscles Treated: Lt gastroc/soleus  Electrical Stimulation Performed: No Treatment Response/Outcome: twitch response elicited    Neuromuscular re-ed: Step up knee driver on BOSU x 6 LLE, x 10 RLE  Prone row 10 lbs 2 x 10  Therapeutic Activity: Re-assessment to determine overall progress, educating patient on progress towards goals  Lateral lunge 1 x 10 each; 1 x 10 RLE, 1 x 6 LLE 10 lbs  Step up knee driver 2  x 10 LLE  Leg press 3 x 10 @ 60 lbs  Modalities: Ice to Rt shoulder and Lt knee x 15 minutes     [facility] Adult PT Treatment:  DATE: 01/27/24 Therapeutic Exercise: Elliptical level 1.5 x 5 minutes Calf stretch on wedge x 1  minute  Hamstring stretch with strap x 1 minute   SL calf raise x 10   Neuromuscular re-ed: HS curl on physioball 2 x 10  [place] lunge on slider x 10 each  Bridge to HS curl on sliders 2 x 10  DL mini hops fwd/bwd attempted d/u due to pain Lunge on airex 2 x 10  [place] HS curl 2 x 5  Resisted backward walking 22.5 lbs x 10  Resisted forward walking x 10; 22.5 lbs  Resisted lateral walking x 10 each; 22.5 lbs   Modalities: Ice to Rt shoulder and Lt knee x 15 minutes      PATIENT EDUCATION:  Education details: HEP update Person educated: Patient Education method: Explanation, Demonstration, Tactile cues, and Verbal cues, handout Education comprehension: verbalized understanding, returned demonstration, verbal cues required, tactile cues required, and needs further education  HOME EXERCISE PROGRAM: Access Code: [email or web address] URL: [email or web address] Date: 02/16/2024 Prepared by: Forrestine Ike  Exercises - Seated Hamstring Stretch  - 2 x daily - 7 x weekly - 3 sets - 30 sec  hold - Supine Static Chest Stretch on Foam Roll  - 2 x daily - 7 x weekly - 1 sets - 1 min hold - Sleeper Stretch  - 2 x daily - 7 x weekly - 3 sets - 30 sec  hold - Supine Scapular Protraction in Flexion with Dumbbells  - 1 x daily - 3 x weekly - 2 sets - 15 reps - Seated Row Cable Machine  - 1 x daily - 3 x weekly - 3 sets - 10 reps - Sidelying Shoulder ER with Towel and Dumbbell  - 1 x daily - 3 x weekly - 2 sets - 10 reps - Serratus Activation at Wall  - 1 x daily - 3 x weekly - 2 sets - 10 reps - Shoulder Internal Rotation with Resistance  - 1 x daily - 3 x weekly - 2 sets - 10 reps - Shoulder External Rotation with Anchored Resistance  - 1 x daily - 3 x weekly - 2 sets - 10 reps - Supine Shoulder Horizontal Abduction with Resistance  - 1 x daily - 3 x weekly - 2 sets - 10 reps - Single Leg Bridge  - 1 x daily - 3 x weekly - 2 sets - 10 reps - Seated Hamstring Curl with Anchored  Resistance  - 1 x daily - 3 x weekly - 2 sets - 10 reps - [place] Deadlift With Barbell  - 1 x daily - 3 x weekly - 2 sets - 10 reps - Barbell Squat  - 1 x daily - 3 x weekly - 2 sets - 10 reps - Single Leg Lunge with Foot on Bench  - 1 x daily - 3 x weekly - 2 sets - 10 reps - Supine Hamstring Curl on Swiss Ball  - 1 x daily - 3 x weekly - 2 sets - 10 reps - Supine Single Leg Eccentric Hamstring Bridge with Slider  - 1 x daily - 3 x weekly - 2 sets - 10 reps - Standing Single Leg Heel Raise  - 1 x daily - 3 x weekly - 3 sets - 10 reps - Runner's Step Up/Down  - 1 x daily - 3 x weekly - 2 sets - 10 reps - Single-Leg [place] Deadlift With Kettlebell  - 1 x daily -  3 x weekly - 3 sets - 10 reps - Mountain Climbers Fast  - 1 x daily - 3 x weekly - 2 sets - 10 reps - [place] on [employer]  - 1 x daily - 3 x weekly - 2 sets - 10 reps - Jump to Single Leg Stance  - 1 x daily - 3 x weekly - 2 sets - 10 reps - Standing Wall [employer] with [employer]  - 1 x daily - 3 x weekly - 3 sets - 20 reps - Squat Jumps  - 1 x daily - 3 x weekly - 2 sets - 10 reps - A Skip  - 1 x daily - 3 x weekly - 2 sets - 10 reps  ASSESSMENT:  CLINICAL IMPRESSION: Patient arrives without reports of pain. Focused on light progression of plyometrics and hamstring strengthening with overall good tolerance. With squat jumps she had initial lateral hamstring pain rated as 2/10 that abolished with continued reps. Completed short distance jogging at slow speed with patient reporting 2/10 lateral hamstring pain with push-off on the LLE. With A-skips she had initial hamstring pain that abolished halfway and then returned for last few feet. With triple extension strengthening she reported dull ache about popliteal fossa on the LLE. Her pain all resolved quickly following these activities. HEP was updated to include further plyo and strengthening with instructions to not let pain exceed a 2/10 and must resolve after these activities  with patient verbalizing understanding.    EVAL: Patient is a 28 y.o. female who was seen today for physical therapy evaluation and treatment for Lt knee pain and Rt shoulder pain.  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 Her Rt shoulder pain has been present for the past 5 years, but recently worsened in the fall with a blocking maneuver while playing basketball. In regards to the Lt knee she is noted to have palpable tenderness about Lt lateral hamstring and lateral joint line, pain and weakness with Lt knee MMT, and bilateral hip weakness. In regards to the Rt shoulder she has positive impingement and bicep special testing, limited shoulder flexion and IR AROM, Rt shoulder weakness, and periscapular weakness. Patient will benefit from skilled PT to address the above stated deficits in order to optimize their function and assist in overall pain reduction.    OBJECTIVE IMPAIRMENTS: decreased activity tolerance, decreased balance, decreased endurance, decreased knowledge of condition, difficulty walking, decreased ROM, decreased strength, increased fascial restrictions, impaired flexibility, impaired UE functional use, improper body mechanics, and pain.   ACTIVITY LIMITATIONS: carrying, lifting, bending, standing, squatting, sleeping, stairs, reach over head, and locomotion level  PARTICIPATION LIMITATIONS: cleaning, laundry, shopping, community activity, and occupation  PERSONAL FACTORS: Age, Past/current experiences, Profession, and Time since onset of injury/illness/exacerbation are also affecting patient's functional outcome.   REHAB POTENTIAL: Good  CLINICAL DECISION MAKING: Evolving/moderate  complexity  EVALUATION COMPLEXITY: Moderate   GOALS: Goals reviewed with patient? Yes  SHORT TERM GOALS: Target date: 01/25/2024  Patient will be independent and compliant with initial HEP.  Baseline: issued at eval Goal status: MET  2.  Patient will demonstrate at least 130 degrees of pain free Lt knee flexion AROM to improve ability  to complete squatting activity.  Baseline: see above Goal status: partially met   3.  Patient will demonstrate 180 degrees of Rt shoulder flexion AROM to improve ability to block in basketball Baseline: see above Goal status: nearly met   4.  Patient will demonstrate at least 80 degrees of Rt shoulder IR AROM to improve ability to complete self-care activities.  Baseline: see above  Goal status: met    LONG TERM GOALS: Target date: 04/01/2024    Patient will score >/= 5 on PSFS to signify clinically meaningful improvement in functional abilities.  Baseline:  Goal status: progressing   2.  Patient will squat without pain to improve ability to maintain defensive stance in basketball.  Baseline: Lt knee pain 01/13/24: mini squat without pain  Goal status: MET  3.  Patient will be able to run at least 10 minutes without onset of knee pain.  Baseline: unable 01/13/24: not appropriate for running given ongoing hamstring pain  01/31/24: pain with gentle plyo  Goal status: progressing as appropriate   4.  Patient will demonstrate 5/5 LLE strength to improve stability with jumping.  Baseline: see above Goal status: partially met   5.  Patient will demonstrate 5/5 Rt shoulder strength to improve tolerance to basketball shooting/throwing.  Baseline: see above  Goal status: progressing   6. Patient will be able to complete jumping and hopping activity (SL and DL) without Lt knee pain in order to rebound and shoot basketball.   Baseline: unable  Goal Status: NEW   PLAN:  PT FREQUENCY: 2x/week  PT DURATION: 8 weeks  PLANNED INTERVENTIONS:  [id number]- PT Re-evaluation, 97110-Therapeutic exercises, 97530- Therapeutic activity, [id number]- Neuromuscular re-education, 97535- Self Care, [id number]- Manual therapy, [id number]- Gait training, [phone]- Aquatic Therapy, Taping, Dry Needling, Cryotherapy, and Moist heat  PLAN FOR NEXT SESSION: review and progress HEP prn; progress hamstring strength and stretching as tolerated. Hip strengthening, shoulder/periscapular strengthening. Progress CKC as tolerated; progress into running and plyo as pain allows.    Ailea Rhatigan, PT, DPT, ATC 02/16/24 4:25 PM

## 2024-02-23 ENCOUNTER — Ambulatory Visit

## 2024-02-23 DIAGNOSIS — G8929 Other chronic pain: Secondary | ICD-10-CM | POA: Diagnosis not present

## 2024-02-23 DIAGNOSIS — M25511 Pain in right shoulder: Secondary | ICD-10-CM | POA: Diagnosis not present

## 2024-02-23 DIAGNOSIS — M25562 Pain in left knee: Secondary | ICD-10-CM

## 2024-02-23 DIAGNOSIS — M6281 Muscle weakness (generalized): Secondary | ICD-10-CM

## 2024-02-23 NOTE — Therapy (Signed)
 OUTPATIENT PHYSICAL THERAPY TREATMENT    Patient Name: Donna Montgomery MRN: [id number] DOB:03-Aug-1996, 28 y.o., female Today's Date: 02/24/2024  END OF SESSION:  PT End of Session - 02/23/24 1444     Visit Number 13    Number of Visits 26    Date for PT Re-Evaluation 04/01/24    Authorization Type [employer]    Authorization - Visit Number 3    Authorization - Number of Visits 4    PT Start Time 1445    PT Stop Time 1545    PT Time Calculation (min) 60 min    Activity Tolerance Patient tolerated treatment well    Behavior During Therapy WFL for tasks assessed/performed                   Past Medical History:  Diagnosis Date   Allergy    Anemia 2014   My blood tests in college said i had low iron and I was given medication. It has been low since.   Anxiety 2014   Had anxiety my whole life, but recognized in college by health professional.   Asthma    Depression 2014   Noticed symptoms in college, was put onto two different medicines. Stopped taking medication and seeked therapy. Symptoms come and go.   Ulcer 2013   Bleeding ulcer from H. Bacteria.   Past Surgical History:  Procedure Laterality Date   FRACTURE SURGERY  2017   Hand fracture   OPEN REDUCTION INTERNAL FIXATION (ORIF) HAND Left 2017   Patient Active Problem List   Diagnosis Date Noted   GAD (generalized anxiety disorder) 08/05/2023   Current moderate episode of major depressive disorder without prior episode (HCC) 08/05/2023   Acute pain of right shoulder 08/05/2023   Acute otitis externa of left ear 08/05/2023   Tinnitus of left ear 08/05/2023   Menorrhagia with regular cycle 10/24/2022   Iron deficiency anemia 10/24/2022   Depression, recurrent (HCC) 03/25/2020   Anxiety 03/25/2020   Chronic toe pain, right foot 07/27/2017   Asthma 08/29/2010   EXERCISE INDUCED ASTHMA 07/11/2010    PCP: Josepha Nickels, DO   REFERRING PROVIDER: Rodgers Clack, DO   REFERRING DIAG:  [phone]  (ICD-10-CM) - Internal derangement of left knee  M25.511,G89.29 (ICD-10-CM) - Chronic right shoulder pain    THERAPY DIAG:  Acute pain of left knee  Chronic right shoulder pain  Muscle weakness (generalized)  Rationale for Evaluation and Treatment: Rehabilitation  ONSET DATE: shoulder exacerbation August 2023; knee March 2024   SUBJECTIVE:   SUBJECTIVE STATEMENT:  Patient reports on Monday 10/10 pain with sharp shooting pain from her Lt knee to her back from working basketball camp on Monday. The only activity she did at camp was defensive stance with pivoting/cutting at slow speed and walking. Was limping yesterday and couldn't straighten the knee. It feels fine today. The shoulder hurt with passing and throwing activity. Was doing well with HEP including jumping and bounding activity up until this exacerbation on Monday.   EVAL: Patient was playing basketball in early March and the first incident of knee pain was running to block a ball and had an ultrasound and was told by the doctor that she inflamed her hamstring tendon. She kept playing and then in the same game she got a fast break and was fouled and she landed on the players foot and her knee went out to the side. She denies hearing any popping/clicking. She kept playing, but was  limping. She continued to play in games, but did not practice until end of March. She worked with the PT while recovering, but didn't do a ton of strengthening. [place] and was referred to PT. The pain is located along the lateral hamstring. She reports there is a new sound in the knee since her injury, but does not think it is popping/clicking. Initially she had instances of the knee giving away, but not recently. Since returning home she has been resting and relaxing with pilates being her only exercise currently.   Patient reports she is right handed and blocks a lot of shots. Her Rt shoulder has been bothering her a  lot more this past year compared to previous years. It hurts to sleep on the right shoulder and feels weak when she tries to move it. She denies any popping/clicking or feelings of instability. She reports shoulder pain has been ongoing for past 5 years, but does recall blocking a shot in August and started to have more shoulder pain after this.     PERTINENT HISTORY: ORIF Lt hand  Exercise induced asthma  PAIN:  Are you having pain? Yes: NPRS scale: 2 Pain location: Lt posterior knee; Rt shoulder Pain description: dull,ache Aggravating factors: overuse Relieving factors: rest   PRECAUTIONS: None  RED FLAGS: None   WEIGHT BEARING RESTRICTIONS: No  FALLS:  Has patient fallen in last 6 months? Yes. Number of falls plays basketball (multiple falls)   LIVING ENVIRONMENT: Lives with: lives with their family Lives in: House/apartment Stairs: Yes: Internal: 15 steps; on right going up Has following equipment at home: None  OCCUPATION: professional basketball player; out of season for now  PLOF: Independent  PATIENT GOALS: I want to be able to bend it. (Knee); sleep on it. (Shoulder); main goal is return to basketball   NEXT MD VISIT: nothing scheduled   OBJECTIVE:  Note: Objective measures were completed at Evaluation unless otherwise noted.  DIAGNOSTIC FINDINGS:  Lt knee MRI: IMPRESSION: 1. Free edge fraying of the posterior horn and body of the lateral meniscus without discrete radial tear or displaced meniscal fragment. 2. The medial meniscus, cruciate and collateral ligaments are intact. 3. Prominent tricompartmental degenerative changes for age, most advanced in the patellofemoral and lateral compartments. No acute osseous findings. 4. Small knee joint effusion.  Rt Shoulder X-ray: IMPRESSION: Negative radiographs of the right shoulder.  PATIENT SURVEYS:  Patient-specific activity scoring scheme (Point to one number):  0 represents "unable to perform."  10 represents "able to perform at prior level. 0 1 2 3 4 5 6 7 8 9  10 (Date and Score) Activity Initial  Activity Eval  01/13/24  01/31/24  Squatting   6  7 10   Sleeping on Rt side  0  4 5  Running 0 0 0  Jumping  0 0 0  Total 1.5 2.75 3.75   Additional Additional Total score = sum of the activity scores/number of activities Minimum detectable change (90%CI) for average score = 2 points Minimum detectable change (90%CI) for single activity score = 3 points PSFS developed by: [place] Spitz., & Binkley, J. (1995). Assessing disability and change on individual  patients: a report of a patient specific measure. Physiotherapy [place], [phone], [phone]. Reproduced with the permission of the authors  Score: 1.5 total    COGNITION: Overall cognitive status: Within functional limits for tasks assessed     SENSATION: Not tested  EDEMA:  No obvious swelling about the knee or shoulder   MUSCLE LENGTH: Hamstrings: Right lacking 2 deg; Left lacking 35 deg pain   POSTURE: No Significant postural limitations  PALPATION: TTP left lateral hamstring Did not palpate Rt shoulder   LOWER EXTREMITY ROM:  Active ROM Right eval Left eval 01/05/24 Left  01/13/24 Left  01/31/24 Left   Hip flexion       Hip extension       Hip abduction       Hip adduction       Hip internal rotation       Hip external rotation       Knee flexion 135 119 pain 124 pain 130 pain  131 pain   Knee extension Full  Full      Ankle dorsiflexion       Ankle plantarflexion       Ankle inversion       Ankle eversion        (Blank rows = not tested)  LOWER EXTREMITY MMT:  MMT Right eval Left eval 01/13/24 Left  01/27/24 Left  01/31/24 Left   Hip flexion 4+ 4 4+   5  Hip extension 4+ 4+ 4+  4+  Hip abduction 4+ 4- pain  4  4+  Hip adduction       Hip internal rotation       Hip external rotation       Knee flexion 5 4+ pain  4+ pain  5 5  Knee extension 4+ 4+ pain 5   5  Ankle  dorsiflexion       Ankle plantarflexion       Ankle inversion       Ankle eversion        (Blank rows = not tested)  UPPER EXTREMITY ROM:  Active ROM Right eval Left eval 01/13/24 Right  01/31/24 Right   Shoulder flexion 150 180 170 175 pain  Shoulder extension      Shoulder abduction 180 pain 180 180 180 pain  Shoulder adduction      Shoulder extension      Shoulder internal rotation 65 85 85   Shoulder external rotation 90 90 82 pain   Elbow flexion      Elbow extension      Wrist flexion      Wrist extension      Wrist ulnar deviation      Wrist radial deviation      Wrist pronation      Wrist supination       (Blank rows = not tested)   UPPER EXTREMITY MMT:  MMT Right eval Left eval 01/13/24 Right  01/31/24 Right   Shoulder flexion 4 5 4+ pain  5  Shoulder extension      Shoulder abduction 5 5 5 5   Shoulder adduction      Shoulder extension      Shoulder internal rotation 5 5 5 5   Shoulder external rotation 4- pain  4 4- 4- pain  Middle trapezius 4 pain 4+ 4 pain  4 pain   Lower trapezius      Elbow flexion      Elbow extension      Wrist flexion      Wrist extension      Wrist ulnar deviation      Wrist radial deviation      Wrist pronation      Wrist supination      Grip strength       (  Blank rows = not tested)  SPECIAL TESTS:  (+) Yergason's, Speeds, Neer's, Hawkin's [place] (-) Empty Can  (+) McMurray's for pain  (-) Anterior drawer, posterior drawer, valgus, varus   02/23/24 (+ pain) McMurray's and Thessaly   FUNCTIONAL TESTS:  Squat: WNL pain left lateral knee  GAIT: Distance walked: 10 ft  Assistive device utilized: None Level of assistance: Complete Independence Comments: no obvious gait abnormalities  [facility] Adult PT Treatment:                                                DATE: 02/23/24  Neuromuscular re-ed: Lateral lunge and lateral lunge BOSU attempted d/c due to pain Forward Lunge to SLS on BOSU 2 x 10  Lateral band walk d/c due  to pain Fwd/bwd band walk blue band at shins 5 sets x 10 ft  Nordic hamstring x 10  SLS stance with dribble x 20 dyna disk SLS basketball toss x 10 dyna disk Therapeutic Activity: SL squat x 10 LLE Modalities: Ice to Rt shoulder and Lt knee x 15 minutes  Self Care: Knee anatomy MRI review Recommended f/u with ortho    [facility] Adult PT Treatment:                                                DATE: 02/16/24 Therapeutic Exercise: Dynamic warm-up: frankensteins, walking hamstring stretch, walking quad stretch, lateral shuffle, high knees  Manual Therapy: IASTM Lt hamstring  K-tape postural corrective Rt shoulder I strip O to I upper trap, coracoid process to inferior scapular angle, coracoid process to medial scapula border 50% tension  Neuromuscular re-ed: Resisted kickback cable machine 2 x 10 @ 5 lbs  Step up knee driver to triple extension  x 10; 6 inch step + airex  Therapeutic Activity: Squat jump 2 x 10  Jog x 100 ft  Walking lunge 2 x 20 ft  A-skip x 50 ft  Modalities: Ice to Rt shoulder and Lt knee x 10 minutes    [facility] Adult PT Treatment:                                                DATE: 02/09/24 Therapeutic Exercise: Dynamic warm-up: frankensteins, walking hamstring stretch, walking quad stretch, walking figure 4 stretch   Neuromuscular re-ed: Prone I on physioball 2 x 15  SL RDL 2 x 10  Mountain climbers on slider 2 x 30  Body blade RUE elbow at 90, palm neutral and pronated 2 x 20 sec each  Wall ball circles CW/CCW flexion 2 x 20 each  Therapeutic Activity: Squat to calf raise to squat 2 x 10  Jump landing from aerobic stepper x 3  DL jump to SL stance onto the RLE 2 x 10  Kettlebell swing 2 x 10; 15 lbs  Fwd bounding landing on LLE x 10, x 3 landing on RLE  Modalities: Ice to Rt shoulder and Lt knee x 15 minutes     [facility] Adult PT Treatment:  DATE: 01/31/24 Therapeutic Exercise: Elliptical level 2 x 5  minutes HS stretch with strap x 1 minute  Manual Therapy: Skilled palpation of trigger points Trigger Point Dry Needling  Subsequent Treatment: Instructions provided previously at initial dry needling treatment.   Patient Verbal Consent Given: Yes Education Handout Provided: Previously Provided Muscles Treated: Lt gastroc/soleus  Electrical Stimulation Performed: No Treatment Response/Outcome: twitch response elicited    Neuromuscular re-ed: Step up knee driver on BOSU x 6 LLE, x 10 RLE  Prone row 10 lbs 2 x 10  Therapeutic Activity: Re-assessment to determine overall progress, educating patient on progress towards goals  Lateral lunge 1 x 10 each; 1 x 10 RLE, 1 x 6 LLE 10 lbs  Step up knee driver 2  x 10 LLE  Leg press 3 x 10 @ 60 lbs  Modalities: Ice to Rt shoulder and Lt knee x 15 minutes       PATIENT EDUCATION:  Education details: HEP review; see treatment  Person educated: Patient Education method: Explanation,  Education comprehension: verbalized understanding  HOME EXERCISE PROGRAM: Access Code: [email or web address] URL: [email or web address] Date: 02/16/2024 Prepared by: Forrestine Ike  Exercises - Seated Hamstring Stretch  - 2 x daily - 7 x weekly - 3 sets - 30 sec  hold - Supine Static Chest Stretch on Foam Roll  - 2 x daily - 7 x weekly - 1 sets - 1 min hold - Sleeper Stretch  - 2 x daily - 7 x weekly - 3 sets - 30 sec  hold - Supine Scapular Protraction in Flexion with Dumbbells  - 1 x daily - 3 x weekly - 2 sets - 15 reps - Seated Row Cable Machine  - 1 x daily - 3 x weekly - 3 sets - 10 reps - Sidelying Shoulder ER with Towel and Dumbbell  - 1 x daily - 3 x weekly - 2 sets - 10 reps - Serratus Activation at Wall  - 1 x daily - 3 x weekly - 2 sets - 10 reps - Shoulder Internal Rotation with Resistance  - 1 x daily - 3 x weekly - 2 sets - 10 reps - Shoulder External Rotation with Anchored Resistance  - 1 x daily - 3 x weekly - 2 sets - 10 reps -  Supine Shoulder Horizontal Abduction with Resistance  - 1 x daily - 3 x weekly - 2 sets - 10 reps - Single Leg Bridge  - 1 x daily - 3 x weekly - 2 sets - 10 reps - Seated Hamstring Curl with Anchored Resistance  - 1 x daily - 3 x weekly - 2 sets - 10 reps - [place] Deadlift With Barbell  - 1 x daily - 3 x weekly - 2 sets - 10 reps - Barbell Squat  - 1 x daily - 3 x weekly - 2 sets - 10 reps - Single Leg Lunge with Foot on Bench  - 1 x daily - 3 x weekly - 2 sets - 10 reps - Supine Hamstring Curl on Swiss Ball  - 1 x daily - 3 x weekly - 2 sets - 10 reps - Supine Single Leg Eccentric Hamstring Bridge with Slider  - 1 x daily - 3 x weekly - 2 sets - 10 reps - Standing Single Leg Heel Raise  - 1 x daily - 3 x weekly - 3 sets - 10 reps - Runner's Step Up/Down  - 1 x daily - 3 x weekly - 2 sets -  10 reps - Single-Leg [place] Deadlift With Kettlebell  - 1 x daily - 3 x weekly - 3 sets - 10 reps - [employer] Fast  - 1 x daily - 3 x weekly - 2 sets - 10 reps - [place] on [employer]  - 1 x daily - 3 x weekly - 2 sets - 10 reps - Jump to Single Leg Stance  - 1 x daily - 3 x weekly - 2 sets - 10 reps - Standing Wall [employer] with [employer]  - 1 x daily - 3 x weekly - 3 sets - 20 reps - Squat Jumps  - 1 x daily - 3 x weekly - 2 sets - 10 reps - A Skip  - 1 x daily - 3 x weekly - 2 sets - 10 reps  ASSESSMENT:  CLINICAL IMPRESSION: Patient reports an increase in Lt knee pain on Monday while working basketball camp where she completed defensive stance drills and walking activity. Pain increased to 10/10 about Lt posterior knee to the low back, but has improved since Monday rated as 2/10 upon arrival today. She does have pain with Thessaly and McMurray's special tests in clinic today. She tolerated sagittal plane strengthening well without onset of pain, though frontal plane strengthening caused immediate pain about popliteal fossa described as deep ache so these exercises were discontinued.  Due to recent exacerbation of pain and change in location of pain it was recommended to f/u with ortho for further assessment as we continue to progress strength, plyometrics, and sport specific activity as tolerated with patient verbalizing understanding.    EVAL: Patient is a 28 y.o. female who was seen today for physical therapy evaluation and treatment for Lt knee pain and Rt shoulder pain.  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 Her Rt shoulder pain has been present for the past 5 years, but recently worsened in the fall with a blocking maneuver while playing basketball. In regards to the Lt knee she is noted to have palpable tenderness about Lt lateral hamstring and lateral joint line, pain and weakness with Lt knee MMT, and bilateral hip weakness. In regards to the Rt shoulder she has positive impingement and bicep special testing, limited shoulder flexion and IR AROM, Rt shoulder weakness, and periscapular weakness. Patient will benefit from skilled PT to address the above stated deficits in order to optimize their function and assist in overall pain reduction.    OBJECTIVE IMPAIRMENTS: decreased activity tolerance, decreased balance, decreased endurance, decreased knowledge of condition, difficulty walking, decreased ROM, decreased strength, increased fascial restrictions, impaired flexibility, impaired UE functional use, improper body mechanics, and pain.   ACTIVITY LIMITATIONS: carrying, lifting, bending, standing, squatting, sleeping, stairs, reach over head, and locomotion level  PARTICIPATION LIMITATIONS: cleaning, laundry, shopping, community activity, and occupation  PERSONAL FACTORS:  Age, Past/current experiences, Profession, and Time since onset of injury/illness/exacerbation are also affecting patient's functional outcome.   REHAB POTENTIAL: Good  CLINICAL DECISION MAKING: Evolving/moderate complexity  EVALUATION COMPLEXITY: Moderate   GOALS: Goals reviewed with patient? Yes  SHORT TERM GOALS: Target date: 01/25/2024  Patient will be independent and compliant with initial HEP.  Baseline: issued at eval Goal status: MET  2.  Patient will demonstrate at least 130 degrees of pain free Lt knee flexion AROM to improve ability to complete squatting activity.  Baseline: see above Goal status: partially met   3.  Patient will demonstrate 180 degrees of Rt shoulder flexion AROM to improve ability to block in basketball Baseline: see above Goal status: nearly met   4.  Patient will demonstrate at least 80 degrees of Rt shoulder IR AROM to improve ability to complete self-care activities.  Baseline: see above  Goal status: met    LONG TERM GOALS: Target date: 04/01/2024    Patient will score >/= 5 on PSFS to signify clinically meaningful improvement in functional abilities.  Baseline:  Goal status: progressing   2.  Patient will squat without pain to improve ability to maintain defensive stance in basketball.  Baseline: Lt knee pain 01/13/24: mini squat without pain  Goal status: MET  3.  Patient will be able to run at least 10 minutes without onset of knee pain.  Baseline: unable 01/13/24: not appropriate for running given ongoing hamstring pain  01/31/24: pain with gentle plyo  Goal status: progressing as appropriate   4.  Patient will demonstrate 5/5 LLE strength to improve stability with jumping.  Baseline: see above Goal status: partially met   5.  Patient will demonstrate 5/5 Rt shoulder strength to improve tolerance to basketball shooting/throwing.  Baseline: see above  Goal status: progressing   6. Patient will be able to complete jumping and  hopping activity (SL and DL) without Lt knee pain in order to rebound and shoot basketball.   Baseline: unable  Goal Status: NEW   PLAN:  PT FREQUENCY: 2x/week  PT DURATION: 8 weeks  PLANNED INTERVENTIONS: 97164- PT Re-evaluation, 97110-Therapeutic exercises, 97530- Therapeutic activity, [id number]- Neuromuscular re-education, 97535- Self Care, 64332- Manual therapy, [id number]- Gait training, [phone]- Aquatic Therapy, Taping, Dry Needling, Cryotherapy, and Moist heat  PLAN FOR NEXT SESSION: review and progress HEP prn; progress hamstring strength and stretching as tolerated. Hip strengthening, shoulder/periscapular strengthening. Progress CKC as tolerated; progress into running and plyo as pain allows. MD f/u?   Demisha Nokes, PT, DPT, ATC 02/24/24 8:25 AM

## 2024-02-24 ENCOUNTER — Ambulatory Visit: Admitting: Dietician

## 2024-02-25 ENCOUNTER — Ambulatory Visit: Admitting: Sports Medicine

## 2024-02-25 ENCOUNTER — Encounter: Payer: Self-pay | Admitting: Podiatry

## 2024-02-25 DIAGNOSIS — M23301 Other meniscus derangements, unspecified lateral meniscus, left knee: Secondary | ICD-10-CM

## 2024-02-25 DIAGNOSIS — M25511 Pain in right shoulder: Secondary | ICD-10-CM | POA: Diagnosis not present

## 2024-02-25 DIAGNOSIS — G8929 Other chronic pain: Secondary | ICD-10-CM

## 2024-02-25 MED ORDER — MELOXICAM 15 MG PO TABS
ORAL_TABLET | ORAL | 3 refills | Status: DC
Start: 2024-02-25 — End: 2024-04-12

## 2024-02-25 NOTE — Progress Notes (Signed)
    Procedures performed today:    None.  Independent interpretation of notes and tests performed by another provider:   None.  Brief History, Exam, Impression, and Recommendations:    Degeneration of lateral meniscus, left Very pleasant 28 year old female, long history of left knee pain, she was playing basketball in [place], had an awkward pivot and then had some pain and swelling posterior and lateral. Since then she has had some imaging done that did show osteoarthritis of the knee, age advanced as well as some tearing of the lateral meniscus. She is on some physical therapy. Unfortunately continues to have discomfort. Today on exam she has tenderness lateral joint line, mild effusion, she has pain with terminal flexion all consistent with a meniscal pain generator.  Send we discussed the treatment options, we will start conservative, meloxicam, additional PT, compression. If this fails we can do a steroid injection, we still have viscosupplementation, PRP, and potential genicular artery embolization as future options. Return to see me in 4 to 6 weeks.  Chronic right shoulder pain Several year history of pain at right shoulder, localized over the deltoid, worse with abduction. She has had some therapy. Persistent pain, occasional mechanical symptoms, on exam she does have positive impingement signs, rotator cuff strength is good, she does also have a positive O'Brien's test, she has 1-2+ translational instability anterior and posterior with a possible catch. Speeds and Yergason tests are negative. Her MRI was done at an outside facility, it showed cuff tendinosis/subacromial bursitis but the labrum was not imaged completely due to lack of contrast. I would like her to do some additional cuff strengthening, meloxicam as below, and if insufficient improvement after 4 to 6 weeks we will do MR arthrography of the right shoulder. For insurance purposes her x-ray was done in  November.    ____________________________________________ Joselyn Nicely. Sandy Crumb, M.D., ABFM., CAQSM., AME. Primary Care and Sports Medicine [facility]  Adjunct Professor of [facility] Medicine  [employer]  Restaurant manager, fast food

## 2024-02-25 NOTE — Assessment & Plan Note (Signed)
 Very pleasant 28 year old female, long history of left knee pain, she was playing basketball in [place], had an awkward pivot and then had some pain and swelling posterior and lateral. Since then she has had some imaging done that did show osteoarthritis of the knee, age advanced as well as some tearing of the lateral meniscus. She is on some physical therapy. Unfortunately continues to have discomfort. Today on exam she has tenderness lateral joint line, mild effusion, she has pain with terminal flexion all consistent with a meniscal pain generator.  Send we discussed the treatment options, we will start conservative, meloxicam, additional PT, compression. If this fails we can do a steroid injection, we still have viscosupplementation, PRP, and potential genicular artery embolization as future options. Return to see me in 4 to 6 weeks.

## 2024-02-25 NOTE — Assessment & Plan Note (Addendum)
 Several year history of pain at right shoulder, localized over the deltoid, worse with abduction. She has had some therapy. Persistent pain, occasional mechanical symptoms, on exam she does have positive impingement signs, rotator cuff strength is good, she does also have a positive O'Brien's test, she has 1-2+ translational instability anterior and posterior with a possible catch. Speeds and Yergason tests are negative. Her MRI was done at an outside facility, it showed cuff tendinosis/subacromial bursitis but the labrum was not imaged completely due to lack of contrast. I would like her to do some additional cuff strengthening, meloxicam as below, and if insufficient improvement after 4 to 6 weeks we will do MR arthrography of the right shoulder. For insurance purposes her x-ray was done in November.

## 2024-02-28 ENCOUNTER — Ambulatory Visit: Payer: Self-pay | Admitting: Obstetrics & Gynecology

## 2024-03-01 ENCOUNTER — Ambulatory Visit

## 2024-03-01 DIAGNOSIS — M6281 Muscle weakness (generalized): Secondary | ICD-10-CM

## 2024-03-01 DIAGNOSIS — M25562 Pain in left knee: Secondary | ICD-10-CM

## 2024-03-01 DIAGNOSIS — G8929 Other chronic pain: Secondary | ICD-10-CM

## 2024-03-01 DIAGNOSIS — M25511 Pain in right shoulder: Secondary | ICD-10-CM | POA: Diagnosis not present

## 2024-03-01 NOTE — Therapy (Signed)
 OUTPATIENT PHYSICAL THERAPY TREATMENT RE-EVALUATION     Patient Name: Donna Montgomery MRN: [id number] DOB:1995-10-25, 28 y.o., female Today's Date: 03/02/2024  END OF SESSION:  PT End of Session - 03/01/24 1447     Visit Number 14    Number of Visits 22    Date for PT Re-Evaluation 04/29/24    Authorization Type [employer]    Authorization - Visit Number 4    Authorization - Number of Visits 4    PT Start Time 1447    PT Stop Time 1545    PT Time Calculation (min) 58 min    Activity Tolerance Patient tolerated treatment well    Behavior During Therapy WFL for tasks assessed/performed                    Past Medical History:  Diagnosis Date   Allergy    Anemia 2014   My blood tests in college said i had low iron and I was given medication. It has been low since.   Anxiety 2014   Had anxiety my whole life, but recognized in college by health professional.   Asthma    Depression 2014   Noticed symptoms in college, was put onto two different medicines. Stopped taking medication and seeked therapy. Symptoms come and go.   Ulcer 2013   Bleeding ulcer from H. Bacteria.   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 episode (HCC) 08/05/2023   Chronic right shoulder pain 08/05/2023   Acute otitis externa of left ear 08/05/2023   Tinnitus of left ear 08/05/2023   Menorrhagia with regular cycle 10/24/2022   Iron deficiency anemia 10/24/2022   Depression, recurrent (HCC) 03/25/2020   Anxiety 03/25/2020   Chronic toe pain, right foot 07/27/2017   Asthma 08/29/2010   EXERCISE INDUCED ASTHMA 07/11/2010    PCP: Josepha Nickels, DO    REFERRING PROVIDER: Rodgers Clack, DO   Gean Keels, MD     REFERRING DIAG:  [phone] (ICD-10-CM) - Internal derangement of left knee  M25.511,G89.29 (ICD-10-CM) - Chronic right shoulder pain   M23.301 (ICD-10-CM) - Degeneration of lateral meniscus, left   THERAPY DIAG:  Acute pain of left knee  Chronic right shoulder pain  Muscle weakness (generalized)  Rationale for Evaluation and Treatment: Rehabilitation  ONSET DATE: shoulder exacerbation August 2023; knee March 2024   SUBJECTIVE:   SUBJECTIVE STATEMENT:  Patient was seen by orthopedic provider on Friday due to exacerbation of Lt knee pain from defensive stance maneuver when working camp last week. Discussed more concerns about meniscus and arthritis in her knee and labral pathology in the shoulder. She is now taking meloxicam for 14 days, which has significantly reduced her pain in both the shoulder and knee.   EVAL: Patient was playing basketball in early March and the first incident of knee pain was running to block a ball and had an ultrasound and was told by the doctor that she inflamed her hamstring tendon. She kept playing and then in the same game she got a fast break and was fouled and she landed on the players foot and her knee went out to the  side. She denies hearing any popping/clicking. She kept playing, but was limping. She continued to play in games, but did not practice until end of March. She worked with the PT while recovering, but didn't do a ton of strengthening. [place] and was referred to PT. The pain is located along the lateral hamstring. She reports there is a new sound in the knee since her injury, but does not think it is popping/clicking. Initially she had instances of the knee giving away, but not recently. Since returning home she has been resting and relaxing with pilates being her only exercise currently.   Patient reports she is right handed and  blocks a lot of shots. Her Rt shoulder has been bothering her a lot more this past year compared to previous years. It hurts to sleep on the right shoulder and feels weak when she tries to move it. She denies any popping/clicking or feelings of instability. She reports shoulder pain has been ongoing for past 5 years, but does recall blocking a shot in August and started to have more shoulder pain after this.     PERTINENT HISTORY: ORIF Lt hand  Exercise induced asthma  PAIN:  Are you having pain? Yes: NPRS scale: none at rest currently; 4 with activity Pain location: Lt posterior knee; Rt shoulder Pain description: dull,ache Aggravating factors: overuse, running, jumping, reaching Relieving factors: rest   PRECAUTIONS: None  RED FLAGS: None   WEIGHT BEARING RESTRICTIONS: No  FALLS:  Has patient fallen in last 6 months? Yes. Number of falls plays basketball (multiple falls)   LIVING ENVIRONMENT: Lives with: lives with their family Lives in: House/apartment Stairs: Yes: Internal: 15 steps; on right going up Has following equipment at home: None  OCCUPATION: professional basketball player; out of season for now  PLOF: Independent  PATIENT GOALS: I want to be able to bend it. (Knee); sleep on it. (Shoulder); main goal is return to basketball   NEXT MD VISIT: nothing scheduled   OBJECTIVE:  Note: Objective measures were completed at Evaluation unless otherwise noted.  DIAGNOSTIC FINDINGS:  Lt knee MRI: IMPRESSION: 1. Free edge fraying of the posterior horn and body of the lateral meniscus without discrete radial tear or displaced meniscal fragment. 2. The medial meniscus, cruciate and collateral ligaments are intact. 3. Prominent tricompartmental degenerative changes for age, most advanced in the patellofemoral and lateral compartments. No acute osseous findings. 4. Small knee joint effusion.  Rt Shoulder X-ray: IMPRESSION: Negative radiographs of the right  shoulder.  PATIENT SURVEYS:  Patient-specific activity scoring scheme (Point to one number):  0 represents "unable to perform." 10 represents "able to perform at prior level. 0 1 2 3 4 5 6 7 8 9  10 (Date and Score) Activity Initial  Activity Eval  01/13/24  01/31/24 03/01/24  Squatting   6  7 10 10   Sleeping on Rt side  0  4 5 8   Running 0 0 0 0  Jumping  0 0 0 5  Total 1.5 2.75 3.75 5.75   Additional Additional Total score = sum of the activity scores/number of activities Minimum detectable change (90%CI) for average score = 2 points Minimum detectable change (90%CI) for single activity score = 3 points PSFS developed by: [place] Spitz., & Binkley, J. (1995). Assessing disability and change on individual  patients: a report of a patient specific measure. Physiotherapy [place], [phone], [phone]. Reproduced with the permission of the authors  Score: 1.5 total    COGNITION: Overall cognitive status: Within functional limits for tasks assessed     SENSATION: Not tested  EDEMA:  No obvious swelling about the knee or shoulder   MUSCLE LENGTH: Hamstrings: Right lacking 2 deg; Left lacking 35 deg pain   POSTURE: No Significant postural limitations  PALPATION: TTP left lateral hamstring Did not palpate Rt shoulder   LOWER EXTREMITY ROM:  Active ROM Right eval Left eval 01/05/24 Left  01/13/24 Left  01/31/24 Left  03/01/24 Left   Hip flexion        Hip extension        Hip abduction        Hip adduction        Hip internal rotation        Hip external rotation        Knee flexion 135 119 pain 124 pain 130 pain  131 pain  136 pain   Knee extension Full  Full       Ankle dorsiflexion        Ankle plantarflexion        Ankle inversion        Ankle eversion         (Blank rows = not tested)  LOWER EXTREMITY MMT:  MMT Right eval Left eval 01/13/24 Left  01/27/24 Left  01/31/24 Left  03/01/24 Left  03/01/24 Right   Hip flexion 4+ 4 4+   5 5    Hip extension 4+ 4+ 4+  4+ 5   Hip abduction 4+ 4- pain  4  4+ 4   Hip adduction         Hip internal rotation         Hip external rotation         Knee flexion 5 4+ pain  4+ pain  5 5 Pain* 115 knee flexion angle with tension dynamometer 7.5 kg, 10 kg, 12.2 kg 115 knee flexion angle with tension dynamometer 21.7 kg, 23.1 kg, 25.5 kg;  Knee extension 4+ 4+ pain 5   5 115 knee flexion angle with tension dynamometer  19 kg, 21.3 kg, 23.5 kg 115 knee flexion angle with tension dynamometer 34.3 kg, 35.4 kg, 33.8 kg  Ankle dorsiflexion         Ankle plantarflexion         Ankle inversion         Ankle eversion          (Blank rows = not tested)  UPPER EXTREMITY ROM:  Active ROM Right eval Left eval 01/13/24 Right  01/31/24 Right  03/01/24 Right   Shoulder flexion 150 180 170 175 pain Full   Shoulder extension       Shoulder abduction 180 pain 180 180 180 pain Full pain   Shoulder adduction       Shoulder extension       Shoulder internal rotation 65 85 85    Shoulder external rotation 90 90 82 pain    Elbow flexion       Elbow extension       Wrist flexion       Wrist extension       Wrist ulnar deviation       Wrist radial deviation       Wrist pronation       Wrist supination        (Blank rows = not tested)   UPPER EXTREMITY MMT:  MMT Right eval Left eval  01/13/24 Right  01/31/24 Right  03/01/24 Right   Shoulder flexion 4 5 4+ pain  5 5  Shoulder extension       Shoulder abduction 5 5 5 5 5  pain   Shoulder adduction       Shoulder extension       Shoulder internal rotation 5 5 5 5 5   Shoulder external rotation 4- pain  4 4- 4- pain 4   Middle trapezius 4 pain 4+ 4 pain  4 pain  4+ pain   Lower trapezius       Elbow flexion       Elbow extension       Wrist flexion       Wrist extension       Wrist ulnar deviation       Wrist radial deviation       Wrist pronation       Wrist supination       Grip strength        (Blank rows = not tested)  SPECIAL TESTS:   (+) Yergason's, Speeds, Neer's, Hawkin's Kennedy (-) Empty Can  (+) McMurray's for pain  (-) Anterior drawer, posterior drawer, valgus, varus   02/23/24 (+ pain) McMurray's and Thessaly   FUNCTIONAL TESTS:  Squat: WNL pain left lateral knee   03/02/24:  Squat: normal mechanics, pain free SLS LLE valgus collapse, increased knee pain  Jogging- immediate pain Max vertical jump- pain on landing 5/10  GAIT: Distance walked: 10 ft  Assistive device utilized: None Level of assistance: Complete Independence Comments: no obvious gait abnormalities  [facility] Adult PT Treatment:                                                DATE: 03/01/24 Therapeutic Exercise: Reviewed and updated HEP   Neuromuscular re-ed: Lateral lunge x 10  Sidelying leg taps x 10  Hip circles CW/CCW 2 x 10  Therapeutic Activity: lateral shuffle x 25 ft pain Jog x 50 ft pain  Re-assessment to determine overall progress, educating patient on progress towards goals  Modalities: Game ready (vaso) Lt knee x 15 minutes medium compression 34 degrees, ice to Rt shoulder x 15 minutes    [facility] Adult PT Treatment:                                                DATE: 02/23/24  Neuromuscular re-ed: Lateral lunge and lateral lunge BOSU attempted d/c due to pain Forward Lunge to SLS on BOSU 2 x 10  Lateral band walk d/c due to pain Fwd/bwd band walk blue band at shins 5 sets x 10 ft  Nordic hamstring x 10  SLS stance with dribble x 20 dyna disk SLS basketball toss x 10 dyna disk Therapeutic Activity: SL squat x 10 LLE Modalities: Ice to Rt shoulder and Lt knee x 15 minutes  Self Care: Knee anatomy MRI review Recommended f/u with ortho    [facility] Adult PT Treatment:  DATE: 02/16/24 Therapeutic Exercise: Dynamic warm-up: frankensteins, walking hamstring stretch, walking quad stretch, lateral shuffle, high knees  Manual Therapy: IASTM Lt hamstring  K-tape postural corrective  Rt shoulder I strip O to I upper trap, coracoid process to inferior scapular angle, coracoid process to medial scapula border 50% tension  Neuromuscular re-ed: Resisted kickback cable machine 2 x 10 @ 5 lbs  Step up knee driver to triple extension  x 10; 6 inch step + airex  Therapeutic Activity: Squat jump 2 x 10  Jog x 100 ft  Walking lunge 2 x 20 ft  A-skip x 50 ft  Modalities: Ice to Rt shoulder and Lt knee x 10 minutes    [facility] Adult PT Treatment:                                                DATE: 02/09/24 Therapeutic Exercise: Dynamic warm-up: frankensteins, walking hamstring stretch, walking quad stretch, walking figure 4 stretch   Neuromuscular re-ed: Prone I on physioball 2 x 15  SL RDL 2 x 10  Mountain climbers on slider 2 x 30  Body blade RUE elbow at 90, palm neutral and pronated 2 x 20 sec each  Wall ball circles CW/CCW flexion 2 x 20 each  Therapeutic Activity: Squat to calf raise to squat 2 x 10  Jump landing from aerobic stepper x 3  DL jump to SL stance onto the RLE 2 x 10  Kettlebell swing 2 x 10; 15 lbs  Fwd bounding landing on LLE x 10, x 3 landing on RLE  Modalities: Ice to Rt shoulder and Lt knee x 15 minutes         PATIENT EDUCATION:  Education details: HEP update; see treatment  Person educated: Patient Education method: Explanation, demo, cues, handout Education comprehension: verbalized understanding, returned demo   HOME EXERCISE PROGRAM: Access Code: [email or web address] URL: [email or web address] Date: 03/01/2024 Prepared by: Forrestine Ike  Exercises - Seated Hamstring Stretch  - 2 x daily - 7 x weekly - 3 sets - 30 sec  hold - Supine Static Chest Stretch on Foam Roll  - 2 x daily - 7 x weekly - 1 sets - 1 min hold - Sleeper Stretch  - 2 x daily - 7 x weekly - 3 sets - 30 sec  hold - Supine Scapular Protraction in Flexion with Dumbbells  - 1 x daily - 3 x weekly - 2 sets - 15 reps - Seated Row Cable Machine  - 1 x daily - 3 x  weekly - 3 sets - 10 reps - Sidelying Shoulder ER with Towel and Dumbbell  - 1 x daily - 3 x weekly - 2 sets - 10 reps - Serratus Activation at Wall  - 1 x daily - 3 x weekly - 2 sets - 10 reps - Shoulder Internal Rotation with Resistance  - 1 x daily - 3 x weekly - 2 sets - 10 reps - Shoulder External Rotation with Anchored Resistance  - 1 x daily - 3 x weekly - 2 sets - 10 reps - Supine Shoulder Horizontal Abduction with Resistance  - 1 x daily - 3 x weekly - 2 sets - 10 reps - Single Leg Bridge  - 1 x daily - 3 x weekly - 2 sets - 10 reps - Seated Hamstring Curl with Anchored Resistance  -  1 x daily - 3 x weekly - 2 sets - 10 reps - [place] Deadlift With Barbell  - 1 x daily - 3 x weekly - 2 sets - 10 reps - Barbell Squat  - 1 x daily - 3 x weekly - 2 sets - 10 reps - Single Leg Lunge with Foot on Bench  - 1 x daily - 3 x weekly - 2 sets - 10 reps - Supine Hamstring Curl on Swiss Ball  - 1 x daily - 3 x weekly - 2 sets - 10 reps - Supine Single Leg Eccentric Hamstring Bridge with Slider  - 1 x daily - 3 x weekly - 2 sets - 10 reps - Standing Single Leg Heel Raise  - 1 x daily - 3 x weekly - 3 sets - 10 reps - Runner's Step Up/Down  - 1 x daily - 3 x weekly - 2 sets - 10 reps - Single-Leg [place] Deadlift With Kettlebell  - 1 x daily - 3 x weekly - 3 sets - 10 reps - Mountain Climbers Fast  - 1 x daily - 3 x weekly - 2 sets - 10 reps - [place] on [employer]  - 1 x daily - 3 x weekly - 2 sets - 10 reps - Jump to Single Leg Stance  - 1 x daily - 3 x weekly - 2 sets - 10 reps - Standing Wall [employer] with [employer]  - 1 x daily - 3 x weekly - 3 sets - 20 reps - Squat Jumps  - 1 x daily - 3 x weekly - 2 sets - 10 reps - A Skip  - 1 x daily - 3 x weekly - 2 sets - 10 reps - Side Stepping with Resistance at Ankles  - 1 x daily - 7 x weekly - 2 sets - 10 reps - Sidelying Hip Circles  - 1 x daily - 7 x weekly - 2 sets - 10 reps - Sidelying Diagonal Hip Abduction  - 1 x daily - 7 x weekly -  2 sets - 10 reps  ASSESSMENT:  CLINICAL IMPRESSION: Patient with new referral to evaluate and treat for degeneration of lateral meniscus after recent orthopedic follow-up due to worsenign knee pain after working a basketball camp on 02/21/24 where she completed walking and defensive stance maneuvers rating her Lt knee pain after this camp as a 10/10. She reports improvement in her knee pain since beginning meloxicam that was prescribed at this visit. Upon assessment she is noted to have full Lt knee flexion AROM, though continues to have pain at end range. She has excellent squat mechanics without pain, but aberrant mechanics and pain with SLS on the LLE. She has tolerated light plyometric activity well (bounding, mini hops), but with increased power (max vertical jump) she is unable to tolerate these maneuvers due to pain. Knee strength measurements were performed with tension dynamometer with significant deficit noted on the LLE compared to RLE. In regards to her chronic Rt shoulder pain she has full ROM, reporting pain with abduction. She continues to have ongoing rotator cuff weakness. 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    EVAL: Patient is a 28 y.o. female who was seen today for  physical therapy evaluation and treatment for Lt knee pain and Rt shoulder pain.  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 Her Rt shoulder pain has been  present for the past 5 years, but recently worsened in the fall with a blocking maneuver while playing basketball. In regards to the Lt knee she is noted to have palpable tenderness about Lt lateral hamstring and lateral joint line, pain and weakness with Lt knee MMT, and bilateral hip weakness. In regards to the Rt shoulder she has positive impingement and bicep special testing, limited shoulder flexion and IR AROM, Rt shoulder weakness, and periscapular weakness. Patient will benefit from skilled PT to address the above stated deficits in order to optimize their function and assist in overall pain reduction.    OBJECTIVE IMPAIRMENTS: decreased activity tolerance, decreased balance, decreased endurance, decreased knowledge of condition, difficulty walking, decreased ROM, decreased strength, increased fascial restrictions, impaired flexibility, impaired UE functional use, improper body mechanics, and pain.   ACTIVITY LIMITATIONS: carrying, lifting, bending, standing, squatting, sleeping, stairs, reach over head, and locomotion level  PARTICIPATION LIMITATIONS: cleaning, laundry, shopping, community activity, and occupation  PERSONAL FACTORS: Age, Past/current experiences, Profession, and Time since onset of injury/illness/exacerbation are also affecting patient's functional outcome.   REHAB POTENTIAL: Good  CLINICAL DECISION MAKING: Evolving/moderate complexity  EVALUATION COMPLEXITY: Moderate   GOALS: Goals reviewed with patient? Yes  SHORT TERM GOALS: Target date: 01/25/2024  Patient will be independent and compliant with initial HEP.  Baseline: issued at eval Goal status: MET  2.  Patient will demonstrate at least 130 degrees of pain free Lt knee flexion AROM to improve ability to complete squatting activity.  Baseline: see above Goal status: partially met   3.  Patient will demonstrate 180 degrees of Rt shoulder flexion AROM to improve ability to block in basketball Baseline: see  above Goal status: MET  4.  Patient will demonstrate at least 80 degrees of Rt shoulder IR AROM to improve ability to complete self-care activities.  Baseline: see above  Goal status: met    LONG TERM GOALS: Target date: 04/29/24    Patient will score >/= 5 on PSFS to signify clinically meaningful improvement in functional abilities.  Baseline:  Goal status: MET   2.  Patient will squat without pain to improve ability to maintain defensive stance in basketball.  Baseline: Lt knee pain 01/13/24: mini squat without pain  Goal status: MET  3.  Patient will be able to run at least 10 minutes with </= 2/10 Lt knee pain in order to play basketball.  Baseline: unable 01/13/24: not appropriate for running given ongoing hamstring pain  01/31/24: pain with gentle plyo  03/02/24: immediate pain with jogging 5/10 Goal status: REVISED   4.  Patient will demonstrate 5/5 Lt hip strength to improve stability with jumping.  Baseline: see above Goal status: REVISED  5.  Patient will demonstrate 5/5 Rt shoulder strength to improve tolerance to basketball shooting/throwing.  Baseline: see above  Goal status: partially met    6. Patient will be able to complete max vertical jump without Lt knee pain in order to rebound basketball.   Baseline: unable    Goal Status: REVISED    7. Patient  will demonstrate at least 18 kg of knee flexion average peak force strength with tension dynamometer testing to improve strength symmetry necessary for safe jump landing mechanics.    Baseline: 9.9 kg average    Goal status: NEW  PLAN:  PT FREQUENCY: 1x/week  PT DURATION: 8 weeks  PLANNED INTERVENTIONS: 97164- PT Re-evaluation, 97750- Physical Performance Testing, 97110-Therapeutic exercises, 97530- Therapeutic activity, [id number]- Neuromuscular re-education, 97535- Self Care, 62952- Manual therapy, [id number]- Gait training, [phone]- Aquatic Therapy, [phone]- Vasopneumatic device, [phone]- Ionotophoresis 4mg /ml  Dexamethasone, 20560 (1-2 muscles), 20561 (3+ muscles)- Dry Needling, Taping, Cryotherapy, and Moist heat  PLAN FOR NEXT SESSION: review and progress HEP prn; progress hamstring strength and stretching as tolerated. Hip strengthening, shoulder/periscapular strengthening. Progress CKC as tolerated; progress into running and plyo as pain allows.    Vonnie Ligman, PT, DPT, ATC 03/02/24 7:25 AM

## 2024-03-07 DIAGNOSIS — F411 Generalized anxiety disorder: Secondary | ICD-10-CM | POA: Diagnosis not present

## 2024-03-10 ENCOUNTER — Telehealth: Admitting: Family Medicine

## 2024-03-10 DIAGNOSIS — J40 Bronchitis, not specified as acute or chronic: Secondary | ICD-10-CM | POA: Diagnosis not present

## 2024-03-10 MED ORDER — AZITHROMYCIN 250 MG PO TABS
ORAL_TABLET | ORAL | 0 refills | Status: AC
Start: 2024-03-10 — End: 2024-03-15

## 2024-03-10 MED ORDER — BENZONATATE 200 MG PO CAPS: 200.0000 mg | ORAL_CAPSULE | Freq: Two times a day (BID) | ORAL | 0 refills | Status: DC | PRN

## 2024-03-10 NOTE — Progress Notes (Signed)

## 2024-03-15 ENCOUNTER — Ambulatory Visit

## 2024-03-15 DIAGNOSIS — K036 Deposits [accretions] on teeth: Secondary | ICD-10-CM | POA: Diagnosis not present

## 2024-03-16 ENCOUNTER — Ambulatory Visit: Attending: Sports Medicine

## 2024-03-16 DIAGNOSIS — M25562 Pain in left knee: Secondary | ICD-10-CM | POA: Insufficient documentation

## 2024-03-16 DIAGNOSIS — M25511 Pain in right shoulder: Secondary | ICD-10-CM | POA: Insufficient documentation

## 2024-03-16 DIAGNOSIS — G8929 Other chronic pain: Secondary | ICD-10-CM | POA: Insufficient documentation

## 2024-03-16 DIAGNOSIS — M6281 Muscle weakness (generalized): Secondary | ICD-10-CM | POA: Insufficient documentation

## 2024-03-16 NOTE — Therapy (Signed)
 OUTPATIENT PHYSICAL THERAPY TREATMENT     Patient Name: Donna Montgomery MRN: [id number] DOB:September 07, 1996, 28 y.o., female Today's Date: 03/16/2024  END OF SESSION:  PT End of Session - 03/16/24 1016     Visit Number 15    Number of Visits 22    Date for PT Re-Evaluation 04/29/24    Authorization Type [employer]    Authorization Time Period 6/30-7/29/25    Authorization - Visit Number 1    Authorization - Number of Visits 4    PT Start Time 1016    PT Stop Time 1115    PT Time Calculation (min) 59 min    Activity Tolerance Patient tolerated treatment well    Behavior During Therapy WFL for tasks assessed/performed                     Past Medical History:  Diagnosis Date   Allergy    Anemia 2014   My blood tests in college said i had low iron and I was given medication. It has been low since.   Anxiety 2014   Had anxiety my whole life, but recognized in college by health professional.   Asthma    Depression 2014   Noticed symptoms in college, was put onto two different medicines. Stopped taking medication and seeked therapy. Symptoms come and go.   Ulcer 2013   Bleeding ulcer from H. Bacteria.   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 episode (HCC) 08/05/2023   Chronic right shoulder pain 08/05/2023   Acute otitis externa of left ear 08/05/2023   Tinnitus of left ear 08/05/2023   Menorrhagia with regular cycle 10/24/2022   Iron deficiency anemia 10/24/2022   Depression, recurrent (HCC) 03/25/2020   Anxiety 03/25/2020   Chronic toe pain, right foot 07/27/2017   Asthma 08/29/2010   EXERCISE INDUCED ASTHMA 07/11/2010     PCP: Bevin Bernice RAMAN, DO   REFERRING PROVIDER: Teressa Rainell BROCKS, DO   Curtis Debby PARAS, MD     REFERRING DIAG:  [phone] (ICD-10-CM) - Internal derangement of left knee  M25.511,G89.29 (ICD-10-CM) - Chronic right shoulder pain   M23.301 (ICD-10-CM) - Degeneration of lateral meniscus, left   THERAPY DIAG:  Acute pain of left knee  Chronic right shoulder pain  Muscle weakness (generalized)  Rationale for Evaluation and Treatment: Rehabilitation  ONSET DATE: shoulder exacerbation August 2023; knee March 2024   SUBJECTIVE:   SUBJECTIVE STATEMENT:  Patient reports she is feeling pretty good. Has been progressing her resistance with strengthening exercises as able. Hurts to sleep on the Rt shoulder. She finished meloxicam  last week. Pain is in the knee with landing from jumps.   EVAL: Patient was playing basketball in early March and the first incident of knee pain was running to block a ball and had an ultrasound and was told by the doctor that she inflamed her hamstring tendon. She kept playing and then in the same game she got a fast break and was fouled and she landed on the players foot and her knee went out to the side. She denies hearing any popping/clicking. She kept playing, but was limping. She continued to  play in games, but did not practice until end of March. She worked with the PT while recovering, but didn't do a ton of strengthening. [place] and was referred to PT. The pain is located along the lateral hamstring. She reports there is a new sound in the knee since her injury, but does not think it is popping/clicking. Initially she had instances of the knee giving away, but not recently. Since returning home she has been resting and relaxing with pilates being her only exercise currently.   Patient reports she is right handed and blocks a lot of shots. Her Rt shoulder has been bothering her a lot more this past year compared to  previous years. It hurts to sleep on the right shoulder and feels weak when she tries to move it. She denies any popping/clicking or feelings of instability. She reports shoulder pain has been ongoing for past 5 years, but does recall blocking a shot in August and started to have more shoulder pain after this.     PERTINENT HISTORY: ORIF Lt hand  Exercise induced asthma  PAIN:  Are you having pain? Yes: NPRS scale: none at rest currently; 4 with activity Pain location: Lt posterior knee; Rt shoulder Pain description: dull,ache Aggravating factors: overuse, running, jumping, reaching Relieving factors: rest   PRECAUTIONS: None  RED FLAGS: None   WEIGHT BEARING RESTRICTIONS: No  FALLS:  Has patient fallen in last 6 months? Yes. Number of falls plays basketball (multiple falls)   LIVING ENVIRONMENT: Lives with: lives with their family Lives in: House/apartment Stairs: Yes: Internal: 15 steps; on right going up Has following equipment at home: None  OCCUPATION: professional basketball player; out of season for now  PLOF: Independent  PATIENT GOALS: I want to be able to bend it. (Knee); sleep on it. (Shoulder); main goal is return to basketball   NEXT MD VISIT: nothing scheduled   OBJECTIVE:  Note: Objective measures were completed at Evaluation unless otherwise noted.  DIAGNOSTIC FINDINGS:  Lt knee MRI: IMPRESSION: 1. Free edge fraying of the posterior horn and body of the lateral meniscus without discrete radial tear or displaced meniscal fragment. 2. The medial meniscus, cruciate and collateral ligaments are intact. 3. Prominent tricompartmental degenerative changes for age, most advanced in the patellofemoral and lateral compartments. No acute osseous findings. 4. Small knee joint effusion.  Rt Shoulder X-ray: IMPRESSION: Negative radiographs of the right shoulder.  PATIENT SURVEYS:  Patient-specific activity scoring scheme (Point to one number):  0  represents "unable to perform." 10 represents "able to perform at prior level. 0 1 2 3 4 5 6 7 8 9  10 (Date and Score) Activity Initial  Activity Eval  01/13/24  01/31/24 03/01/24  Squatting   6  7 10 10   Sleeping on Rt side  0  4 5 8   Running 0 0 0 0  Jumping  0 0 0 5  Total 1.5 2.75 3.75 5.75   Additional Additional Total score = sum of the activity scores/number of activities Minimum detectable change (90%CI) for average score = 2 points Minimum detectable change (90%CI) for single activity score = 3 points PSFS developed by: Rosalee MYRTIS Marvis KYM Charlet CHRISTELLA., & Binkley, J. (1995). Assessing disability and change on individual  patients: a report of a patient specific measure. Physiotherapy [place], 47, [phone]. Reproduced with the permission of the authors  Score: 1.5 total    COGNITION: Overall cognitive status: Within functional limits for tasks  assessed     SENSATION: Not tested  EDEMA:  No obvious swelling about the knee or shoulder   MUSCLE LENGTH: Hamstrings: Right lacking 2 deg; Left lacking 35 deg pain   POSTURE: No Significant postural limitations  PALPATION: TTP left lateral hamstring Did not palpate Rt shoulder   LOWER EXTREMITY ROM:  Active ROM Right eval Left eval 01/05/24 Left  01/13/24 Left  01/31/24 Left  03/01/24 Left   Hip flexion        Hip extension        Hip abduction        Hip adduction        Hip internal rotation        Hip external rotation        Knee flexion 135 119 pain 124 pain 130 pain  131 pain  136 pain   Knee extension Full  Full       Ankle dorsiflexion        Ankle plantarflexion        Ankle inversion        Ankle eversion         (Blank rows = not tested)  LOWER EXTREMITY MMT:  MMT Right eval Left eval 01/13/24 Left  01/27/24 Left  01/31/24 Left  03/01/24 Left  03/01/24 Right   Hip flexion 4+ 4 4+   5 5   Hip extension 4+ 4+ 4+  4+ 5   Hip abduction 4+ 4- pain  4  4+ 4   Hip adduction         Hip internal  rotation         Hip external rotation         Knee flexion 5 4+ pain  4+ pain  5 5 Pain* 115 knee flexion angle with tension dynamometer 7.5 kg, 10 kg, 12.2 kg 115 knee flexion angle with tension dynamometer 21.7 kg, 23.1 kg, 25.5 kg;  Knee extension 4+ 4+ pain 5   5 115 knee flexion angle with tension dynamometer  19 kg, 21.3 kg, 23.5 kg 115 knee flexion angle with tension dynamometer 34.3 kg, 35.4 kg, 33.8 kg  Ankle dorsiflexion         Ankle plantarflexion         Ankle inversion         Ankle eversion          (Blank rows = not tested)  UPPER EXTREMITY ROM:  Active ROM Right eval Left eval 01/13/24 Right  01/31/24 Right  03/01/24 Right   Shoulder flexion 150 180 170 175 pain Full   Shoulder extension       Shoulder abduction 180 pain 180 180 180 pain Full pain   Shoulder adduction       Shoulder extension       Shoulder internal rotation 65 85 85    Shoulder external rotation 90 90 82 pain    Elbow flexion       Elbow extension       Wrist flexion       Wrist extension       Wrist ulnar deviation       Wrist radial deviation       Wrist pronation       Wrist supination        (Blank rows = not tested)   UPPER EXTREMITY MMT:  MMT Right eval Left eval 01/13/24 Right  01/31/24 Right  03/01/24 Right   Shoulder flexion 4 5 4+  pain  5 5  Shoulder extension       Shoulder abduction 5 5 5 5 5  pain   Shoulder adduction       Shoulder extension       Shoulder internal rotation 5 5 5 5 5   Shoulder external rotation 4- pain  4 4- 4- pain 4   Middle trapezius 4 pain 4+ 4 pain  4 pain  4+ pain   Lower trapezius       Elbow flexion       Elbow extension       Wrist flexion       Wrist extension       Wrist ulnar deviation       Wrist radial deviation       Wrist pronation       Wrist supination       Grip strength        (Blank rows = not tested)  SPECIAL TESTS:  (+) Yergason's, Speeds, Neer's, Hawkin's Kennedy (-) Empty Can  (+) McMurray's for pain  (-) Anterior  drawer, posterior drawer, valgus, varus   02/23/24 (+ pain) McMurray's and Thessaly   FUNCTIONAL TESTS:  Squat: WNL pain left lateral knee   03/02/24:  Squat: normal mechanics, pain free SLS LLE valgus collapse, increased knee pain  Jogging- immediate pain Max vertical jump- pain on landing 5/10  GAIT: Distance walked: 10 ft  Assistive device utilized: None Level of assistance: Complete Independence Comments: no obvious gait abnormalities  [facility] Adult PT Treatment:                                                DATE: 03/16/24 Therapeutic Exercise: Lateral and backward band walks 2 sets d/b x 15 ft each black band at shins  Clamshells black band 2 x 15   Neuromuscular re-ed: Standing IR/ER x 10 black band  Body blade elbow at 90 degrees neutral and pronated 2 x 20 sec Rhythmic stabilization physioball at wall 4 x 20 sec flexion  Therapeutic Activity: Squat to quick stand x 10  Squat with black band at thighs 2 x 10  Outdoor Jogging x 2 minutes Modalities: Ice x 15 minutes Lt knee and Rt shoulder Self Care: Begin interval walk, jog 2 min;1 min keeping pain <4; 3 x weekly at most.  Discussed expectations of returning to higher level activity as it relates to knee pathology    [facility] Adult PT Treatment:                                                DATE: 03/01/24 Therapeutic Exercise: Reviewed and updated HEP   Neuromuscular re-ed: Lateral lunge x 10  Sidelying leg taps x 10  Hip circles CW/CCW 2 x 10  Therapeutic Activity: lateral shuffle x 25 ft pain Jog x 50 ft pain  Re-assessment to determine overall progress, educating patient on progress towards goals  Modalities: Game ready (vaso) Lt knee x 15 minutes medium compression 34 degrees, ice to Rt shoulder x 15 minutes    [facility] Adult PT Treatment:  DATE: 02/23/24  Neuromuscular re-ed: Lateral lunge and lateral lunge BOSU attempted d/c due to pain Forward Lunge to SLS on  BOSU 2 x 10  Lateral band walk d/c due to pain Fwd/bwd band walk blue band at shins 5 sets x 10 ft  Nordic hamstring x 10  SLS stance with dribble x 20 dyna disk SLS basketball toss x 10 dyna disk Therapeutic Activity: SL squat x 10 LLE Modalities: Ice to Rt shoulder and Lt knee x 15 minutes  Self Care: Knee anatomy MRI review Recommended f/u with ortho    [facility] Adult PT Treatment:                                                DATE: 02/16/24 Therapeutic Exercise: Dynamic warm-up: frankensteins, walking hamstring stretch, walking quad stretch, lateral shuffle, high knees  Manual Therapy: IASTM Lt hamstring  K-tape postural corrective Rt shoulder I strip O to I upper trap, coracoid process to inferior scapular angle, coracoid process to medial scapula border 50% tension  Neuromuscular re-ed: Resisted kickback cable machine 2 x 10 @ 5 lbs  Step up knee driver to triple extension  x 10; 6 inch step + airex  Therapeutic Activity: Squat jump 2 x 10  Jog x 100 ft  Walking lunge 2 x 20 ft  A-skip x 50 ft  Modalities: Ice to Rt shoulder and Lt knee x 10 minutes          PATIENT EDUCATION:  Education details: HEP update; see treatment  Person educated: Patient Education method: Explanation, demo, cues, handout Education comprehension: verbalized understanding, returned demo   HOME EXERCISE PROGRAM: Access Code: [email or web address] URL: [email or web address] Date: 03/16/2024 Prepared by: Lucie Meeter  Exercises - Seated Hamstring Stretch  - 2 x daily - 7 x weekly - 3 sets - 30 sec  hold - Supine Static Chest Stretch on Foam Roll  - 2 x daily - 7 x weekly - 1 sets - 1 min hold - Sleeper Stretch  - 2 x daily - 7 x weekly - 3 sets - 30 sec  hold - Supine Scapular Protraction in Flexion with Dumbbells  - 1 x daily - 3 x weekly - 2 sets - 15 reps - Seated Row Cable Machine  - 1 x daily - 3 x weekly - 3 sets - 10 reps - Sidelying Shoulder ER with Towel and Dumbbell  - 1 x  daily - 3 x weekly - 2 sets - 10 reps - Serratus Activation at Wall  - 1 x daily - 3 x weekly - 2 sets - 10 reps - Standing Wall [employer] with Mini Swiss Ball  - 1 x daily - 3 x weekly - 3 sets - 20 reps - Shoulder Internal Rotation with Resistance  - 1 x daily - 3 x weekly - 2 sets - 10 reps - Shoulder External Rotation with Anchored Resistance  - 1 x daily - 3 x weekly - 2 sets - 10 reps - Supine Shoulder Horizontal Abduction with Resistance  - 1 x daily - 3 x weekly - 2 sets - 10 reps - Single Leg Bridge  - 1 x daily - 3 x weekly - 2 sets - 10 reps - Seated Hamstring Curl with Anchored Resistance  - 1 x daily - 3 x weekly - 2 sets - 10 reps -  [place] Deadlift With Barbell  - 1 x daily - 3 x weekly - 2 sets - 10 reps - Barbell Squat  - 1 x daily - 3 x weekly - 2 sets - 10 reps - Single Leg Lunge with Foot on Bench  - 1 x daily - 3 x weekly - 2 sets - 10 reps - Supine Hamstring Curl on Swiss Ball  - 1 x daily - 3 x weekly - 2 sets - 10 reps - Supine Single Leg Eccentric Hamstring Bridge with Slider  - 1 x daily - 3 x weekly - 2 sets - 10 reps - Standing Single Leg Heel Raise  - 1 x daily - 3 x weekly - 3 sets - 10 reps - Runner's Step Up/Down  - 1 x daily - 3 x weekly - 2 sets - 10 reps - Single-Leg [place] Deadlift With Kettlebell  - 1 x daily - 3 x weekly - 3 sets - 10 reps - [employer] Fast  - 1 x daily - 3 x weekly - 2 sets - 10 reps - Pike on [employer]  - 1 x daily - 3 x weekly - 2 sets - 10 reps - Jump to Single Leg Stance  - 1 x daily - 3 x weekly - 2 sets - 10 reps - Squat Jumps  - 1 x daily - 3 x weekly - 2 sets - 10 reps - A Skip  - 1 x daily - 3 x weekly - 2 sets - 10 reps - Side Stepping with Resistance at Ankles  - 1 x daily - 7 x weekly - 2 sets - 10 reps - Sidelying Hip Circles  - 1 x daily - 7 x weekly - 2 sets - 10 reps - Sidelying Diagonal Hip Abduction  - 1 x daily - 7 x weekly - 2 sets - 10 reps - Clam with Resistance  - 1 x daily - 7 x weekly - 2 sets - 10  reps  ASSESSMENT:  CLINICAL IMPRESSION: Progressed Rt shoulder stabilization with good tolerance. With progression of hip strengthening she does note occasional popping in the Lt knee with lateral band walks. With initial squat to quick stand she reported lateral knee pain that subsided with continued reps. We focused on gluteal strengthening with patient fatiguing quickly with Lt hip abductor strengthening. Patient was able to complete 2 minute jog with pain remaining at 1/10 for 1.5 minutes and slightly increased for last 30 seconds. We had lengthy conversation regarding expectations that she will likely experience some knee pain with higher level activity given her knee pathology, but need to keep that pain at lower levels with patient verbalizing understanding. Was instructed that she could begin interval walk/jog keeping pain levels below 4/10 with patient verbalizing understanding.    EVAL: Patient is a 28 y.o. female who was seen today for physical therapy evaluation and treatment for Lt knee pain and Rt shoulder pain.  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 Her Rt shoulder pain has been present for the past 5 years, but recently worsened in the fall with a blocking maneuver while playing basketball. In regards to the Lt knee she is noted to have  palpable tenderness about Lt lateral hamstring and lateral joint line, pain and weakness with Lt knee MMT, and bilateral hip weakness. In regards to the Rt shoulder she has positive impingement and bicep special testing, limited shoulder flexion and IR AROM, Rt shoulder weakness, and periscapular weakness. Patient will benefit from skilled PT to address the  above stated deficits in order to optimize their function and assist in overall pain reduction.    OBJECTIVE IMPAIRMENTS: decreased activity tolerance, decreased balance, decreased endurance, decreased knowledge of condition, difficulty walking, decreased ROM, decreased strength, increased fascial restrictions, impaired flexibility, impaired UE functional use, improper body mechanics, and pain.   ACTIVITY LIMITATIONS: carrying, lifting, bending, standing, squatting, sleeping, stairs, reach over head, and locomotion level  PARTICIPATION LIMITATIONS: cleaning, laundry, shopping, community activity, and occupation  PERSONAL FACTORS: Age, Past/current experiences, Profession, and Time since onset of injury/illness/exacerbation are also affecting patient's functional outcome.   REHAB POTENTIAL: Good  CLINICAL DECISION MAKING: Evolving/moderate complexity  EVALUATION COMPLEXITY: Moderate   GOALS: Goals reviewed with patient? Yes  SHORT TERM GOALS: Target date: 01/25/2024  Patient will be independent and compliant with initial HEP.  Baseline: issued at eval Goal status: MET  2.  Patient will demonstrate at least 130 degrees of pain free Lt knee flexion AROM to improve ability to complete squatting activity.  Baseline: see above Goal status: partially met   3.  Patient will demonstrate 180 degrees of Rt shoulder flexion AROM to improve ability to block in basketball Baseline: see above Goal status: MET  4.  Patient will demonstrate at least 80 degrees of Rt shoulder IR AROM to improve ability to complete self-care activities.  Baseline: see above  Goal status: met    LONG TERM GOALS: Target date: 04/29/24    Patient will score >/= 5 on PSFS to signify clinically meaningful improvement in functional abilities.  Baseline:  Goal status: MET   2.  Patient will squat without pain to improve ability to maintain defensive stance in basketball.  Baseline: Lt knee pain 01/13/24: mini  squat without pain  Goal status: MET  3.  Patient will be able to run at least 10 minutes with </= 2/10 Lt knee pain in order to play basketball.  Baseline: unable 01/13/24: not appropriate for running given ongoing hamstring pain  01/31/24: pain with gentle plyo  03/02/24: immediate pain with jogging 5/10 Goal status: REVISED   4.  Patient will demonstrate 5/5 Lt hip strength to improve stability with jumping.  Baseline: see above Goal status: REVISED  5.  Patient will demonstrate 5/5 Rt shoulder strength to improve tolerance to basketball shooting/throwing.  Baseline: see above  Goal status: partially met    6. Patient will be able to complete max vertical jump without Lt knee pain in order to rebound basketball.   Baseline: unable    Goal Status: REVISED    7. Patient will demonstrate at least 18 kg of knee flexion average peak force strength with tension dynamometer testing to improve strength symmetry necessary for safe jump landing mechanics.    Baseline: 9.9 kg average    Goal status: NEW  PLAN:  PT FREQUENCY: 1x/week  PT DURATION: 8 weeks  PLANNED INTERVENTIONS: 97164- PT Re-evaluation, 97750- Physical Performance Testing, 97110-Therapeutic exercises, 97530- Therapeutic activity, [id number]- Neuromuscular re-education, 97535- Self Care, [id number]- Manual therapy, [id number]- Gait training, [phone]- Aquatic Therapy, [phone]- Vasopneumatic device, [phone]- Ionotophoresis 4mg /ml Dexamethasone, [id number] (1-2 muscles), 20561 (3+ muscles)- Dry Needling, Taping, Cryotherapy, and Moist heat  PLAN FOR NEXT SESSION:  review and progress HEP prn; progress hamstring strength and stretching as tolerated. Hip strengthening, shoulder/periscapular strengthening. Progress CKC as tolerated; progress into running and plyo as pain allows.    Kemari Narez, PT, DPT, ATC 03/16/24 11:58 AM

## 2024-03-24 ENCOUNTER — Ambulatory Visit

## 2024-03-24 DIAGNOSIS — M6281 Muscle weakness (generalized): Secondary | ICD-10-CM | POA: Diagnosis not present

## 2024-03-24 DIAGNOSIS — G8929 Other chronic pain: Secondary | ICD-10-CM

## 2024-03-24 DIAGNOSIS — M25562 Pain in left knee: Secondary | ICD-10-CM

## 2024-03-24 DIAGNOSIS — M25511 Pain in right shoulder: Secondary | ICD-10-CM | POA: Diagnosis not present

## 2024-03-24 NOTE — Therapy (Signed)
 OUTPATIENT PHYSICAL THERAPY TREATMENT     Patient Name: NATALEAH SCIONEAUX MRN: [id number] DOB:21-Jun-1996, 28 y.o., female Today's Date: 03/24/2024  END OF SESSION:  PT End of Session - 03/24/24 1109     Visit Number 16    Number of Visits 22    Date for PT Re-Evaluation 04/29/24    Authorization Type [employer]    Authorization Time Period 6/30-7/29/25    Authorization - Visit Number 2    Authorization - Number of Visits 4    PT Start Time 1109    PT Stop Time 1215    PT Time Calculation (min) 66 min    Activity Tolerance Patient tolerated treatment well    Behavior During Therapy WFL for tasks assessed/performed                      Past Medical History:  Diagnosis Date   Allergy    Anemia 2014   My blood tests in college said i had low iron and I was given medication. It has been low since.   Anxiety 2014   Had anxiety my whole life, but recognized in college by health professional.   Asthma    Depression 2014   Noticed symptoms in college, was put onto two different medicines. Stopped taking medication and seeked therapy. Symptoms come and go.   Ulcer 2013   Bleeding ulcer from H. Bacteria.   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 episode (HCC) 08/05/2023   Chronic right shoulder pain 08/05/2023   Acute otitis externa of left ear 08/05/2023   Tinnitus of left ear 08/05/2023   Menorrhagia with regular cycle 10/24/2022   Iron deficiency anemia 10/24/2022   Depression, recurrent (HCC) 03/25/2020   Anxiety 03/25/2020   Chronic toe pain, right foot 07/27/2017   Asthma 08/29/2010   EXERCISE INDUCED ASTHMA 07/11/2010     PCP: Bevin Bernice RAMAN, DO   REFERRING PROVIDER: Teressa Rainell BROCKS, DO   Curtis Debby PARAS, MD     REFERRING DIAG:  [phone] (ICD-10-CM) - Internal derangement of left knee  M25.511,G89.29 (ICD-10-CM) - Chronic right shoulder pain   M23.301 (ICD-10-CM) - Degeneration of lateral meniscus, left   THERAPY DIAG:  Acute pain of left knee  Chronic right shoulder pain  Muscle weakness (generalized)  Rationale for Evaluation and Treatment: Rehabilitation  ONSET DATE: shoulder exacerbation August 2023; knee March 2024   SUBJECTIVE:   SUBJECTIVE STATEMENT:  She was able to complete 3 days of interval walk/jog. Had knee pain with the first interval jog. The second jog she had knee pain at the beginning, but subsided with continued walk/jog. The third interval just had pain initially that quickly subsided. Describes instances of the Rt shoulder shifting with sleep. Everyday pain has not been as prominent in the shoulder. Still gets clicking/popping in the shoulder.   EVAL: Patient was playing basketball in early March and the first incident of knee pain was running to block a ball and had an ultrasound and was told by the doctor that she inflamed her hamstring tendon. She kept playing and then in the same game she got a fast break and  was fouled and she landed on the players foot and her knee went out to the side. She denies hearing any popping/clicking. She kept playing, but was limping. She continued to play in games, but did not practice until end of March. She worked with the PT while recovering, but didn't do a ton of strengthening. [place] and was referred to PT. The pain is located along the lateral hamstring. She reports there is a new sound in the knee since her injury, but does not think it is popping/clicking. Initially she had instances of the knee giving away, but not recently. Since returning home she has been resting and relaxing with  pilates being her only exercise currently.   Patient reports she is right handed and blocks a lot of shots. Her Rt shoulder has been bothering her a lot more this past year compared to previous years. It hurts to sleep on the right shoulder and feels weak when she tries to move it. She denies any popping/clicking or feelings of instability. She reports shoulder pain has been ongoing for past 5 years, but does recall blocking a shot in August and started to have more shoulder pain after this.     PERTINENT HISTORY: ORIF Lt hand  Exercise induced asthma  PAIN:  Are you having pain? Yes: NPRS scale: none currently; 2 (knee) with jogging, 4 (shoulder) with pushups Pain location: Lt posterior knee; Rt shoulder Pain description: dull,ache Aggravating factors: overuse, running, jumping, reaching Relieving factors: rest   PRECAUTIONS: None  RED FLAGS: None   WEIGHT BEARING RESTRICTIONS: No  FALLS:  Has patient fallen in last 6 months? Yes. Number of falls plays basketball (multiple falls)   LIVING ENVIRONMENT: Lives with: lives with their family Lives in: House/apartment Stairs: Yes: Internal: 15 steps; on right going up Has following equipment at home: None  OCCUPATION: professional basketball player; out of season for now  PLOF: Independent  PATIENT GOALS: I want to be able to bend it. (Knee); sleep on it. (Shoulder); main goal is return to basketball   NEXT MD VISIT: nothing scheduled   OBJECTIVE:  Note: Objective measures were completed at Evaluation unless otherwise noted.  DIAGNOSTIC FINDINGS:  Lt knee MRI: IMPRESSION: 1. Free edge fraying of the posterior horn and body of the lateral meniscus without discrete radial tear or displaced meniscal fragment. 2. The medial meniscus, cruciate and collateral ligaments are intact. 3. Prominent tricompartmental degenerative changes for age, most advanced in the patellofemoral and lateral compartments. No acute osseous  findings. 4. Small knee joint effusion.  Rt Shoulder X-ray: IMPRESSION: Negative radiographs of the right shoulder.  PATIENT SURVEYS:  Patient-specific activity scoring scheme (Point to one number):  0 represents "unable to perform." 10 represents "able to perform at prior level. 0 1 2 3 4 5 6 7 8 9  10 (Date and Score) Activity Initial  Activity Eval  01/13/24  01/31/24 03/01/24  Squatting   6  7 10 10   Sleeping on Rt side  0  4 5 8   Running 0 0 0 0  Jumping  0 0 0 5  Total 1.5 2.75 3.75 5.75   Additional Additional Total score = sum of the activity scores/number of activities Minimum detectable change (90%CI) for average score = 2 points Minimum detectable change (90%CI) for single activity score = 3 points PSFS developed by: Rosalee MYRTIS Marvis KYM Charlet CHRISTELLA., & Binkley, J. (1995). Assessing disability and change on individual  patients:  a report of a patient specific measure. Physiotherapy [place], 47, [phone]. Reproduced with the permission of the authors  Score: 1.5 total    COGNITION: Overall cognitive status: Within functional limits for tasks assessed     SENSATION: Not tested  EDEMA:  No obvious swelling about the knee or shoulder   MUSCLE LENGTH: Hamstrings: Right lacking 2 deg; Left lacking 35 deg pain   POSTURE: No Significant postural limitations  PALPATION: TTP left lateral hamstring Did not palpate Rt shoulder   LOWER EXTREMITY ROM:  Active ROM Right eval Left eval 01/05/24 Left  01/13/24 Left  01/31/24 Left  03/01/24 Left   Hip flexion        Hip extension        Hip abduction        Hip adduction        Hip internal rotation        Hip external rotation        Knee flexion 135 119 pain 124 pain 130 pain  131 pain  136 pain   Knee extension Full  Full       Ankle dorsiflexion        Ankle plantarflexion        Ankle inversion        Ankle eversion         (Blank rows = not tested)  LOWER EXTREMITY MMT:  MMT Right eval  Left eval 01/13/24 Left  01/27/24 Left  01/31/24 Left  03/01/24 Left  03/01/24 Right   Hip flexion 4+ 4 4+   5 5   Hip extension 4+ 4+ 4+  4+ 5   Hip abduction 4+ 4- pain  4  4+ 4   Hip adduction         Hip internal rotation         Hip external rotation         Knee flexion 5 4+ pain  4+ pain  5 5 Pain* 115 knee flexion angle with tension dynamometer 7.5 kg, 10 kg, 12.2 kg 115 knee flexion angle with tension dynamometer 21.7 kg, 23.1 kg, 25.5 kg;  Knee extension 4+ 4+ pain 5   5 115 knee flexion angle with tension dynamometer  19 kg, 21.3 kg, 23.5 kg 115 knee flexion angle with tension dynamometer 34.3 kg, 35.4 kg, 33.8 kg  Ankle dorsiflexion         Ankle plantarflexion         Ankle inversion         Ankle eversion          (Blank rows = not tested)  UPPER EXTREMITY ROM:  Active ROM Right eval Left eval 01/13/24 Right  01/31/24 Right  03/01/24 Right   Shoulder flexion 150 180 170 175 pain Full   Shoulder extension       Shoulder abduction 180 pain 180 180 180 pain Full pain   Shoulder adduction       Shoulder extension       Shoulder internal rotation 65 85 85    Shoulder external rotation 90 90 82 pain    Elbow flexion       Elbow extension       Wrist flexion       Wrist extension       Wrist ulnar deviation       Wrist radial deviation       Wrist pronation       Wrist supination        (  Blank rows = not tested)   UPPER EXTREMITY MMT:  MMT Right eval Left eval 01/13/24 Right  01/31/24 Right  03/01/24 Right   Shoulder flexion 4 5 4+ pain  5 5  Shoulder extension       Shoulder abduction 5 5 5 5 5  pain   Shoulder adduction       Shoulder extension       Shoulder internal rotation 5 5 5 5 5   Shoulder external rotation 4- pain  4 4- 4- pain 4   Middle trapezius 4 pain 4+ 4 pain  4 pain  4+ pain   Lower trapezius       Elbow flexion       Elbow extension       Wrist flexion       Wrist extension       Wrist ulnar deviation       Wrist radial deviation        Wrist pronation       Wrist supination       Grip strength        (Blank rows = not tested)  SPECIAL TESTS:  (+) Yergason's, Speeds, Neer's, Hawkin's Kennedy (-) Empty Can  (+) McMurray's for pain  (-) Anterior drawer, posterior drawer, valgus, varus   02/23/24 (+ pain) McMurray's and Thessaly   FUNCTIONAL TESTS:  Squat: WNL pain left lateral knee   03/02/24:  Squat: normal mechanics, pain free SLS LLE valgus collapse, increased knee pain  Jogging- immediate pain Max vertical jump- pain on landing 5/10  GAIT: Distance walked: 10 ft  Assistive device utilized: None Level of assistance: Complete Independence Comments: no obvious gait abnormalities  [facility] Adult PT Treatment:                                                DATE: 03/24/24  Neuromuscular re-ed: Fire hydrant x 10 each blue band Shoulder wall taps x 10  Quadruped scapular retraction/protraction x 10  Attempted quadruped arm extension d/c due to pain  Therapeutic Activity: Dynamic warm-up: walking hamstirng stretch, walking quad stretch, hip openers, carioca, A-skip, frankenstein Bodyweight squat x 10  Jogging 60% max speed x 2 minutes Incline jog outdoor 5 x 100 ft, 60% max speed  100 yard, jog (60%) to 90% max speed to deceleration   Self Care: interval walk, jog 1 min;2 min keeping pain <4; 3 x weekly at most.  Begin incline jogging at 60-75% max speed   Modalities: Ice x 15 minutes Lt knee and Rt shoulder  [facility] Adult PT Treatment:                                                DATE: 03/16/24 Therapeutic Exercise: Lateral and backward band walks 2 sets d/b x 15 ft each black band at shins  Clamshells black band 2 x 15   Neuromuscular re-ed: Standing IR/ER x 10 black band  Body blade elbow at 90 degrees neutral and pronated 2 x 20 sec Rhythmic stabilization physioball at wall 4 x 20 sec flexion  Therapeutic Activity: Squat to quick stand x 10  Squat with black band at thighs 2 x 10  Outdoor Jogging x  2 minutes Modalities:  Ice x 15 minutes Lt knee and Rt shoulder Self Care: Begin interval walk, jog 2 min;1 min keeping pain <4; 3 x weekly at most.  Discussed expectations of returning to higher level activity as it relates to knee pathology    [facility] Adult PT Treatment:                                                DATE: 03/01/24 Therapeutic Exercise: Reviewed and updated HEP   Neuromuscular re-ed: Lateral lunge x 10  Sidelying leg taps x 10  Hip circles CW/CCW 2 x 10  Therapeutic Activity: lateral shuffle x 25 ft pain Jog x 50 ft pain  Re-assessment to determine overall progress, educating patient on progress towards goals  Modalities: Game ready (vaso) Lt knee x 15 minutes medium compression 34 degrees, ice to Rt shoulder x 15 minutes    [facility] Adult PT Treatment:                                                DATE: 02/23/24  Neuromuscular re-ed: Lateral lunge and lateral lunge BOSU attempted d/c due to pain Forward Lunge to SLS on BOSU 2 x 10  Lateral band walk d/c due to pain Fwd/bwd band walk blue band at shins 5 sets x 10 ft  Nordic hamstring x 10  SLS stance with dribble x 20 dyna disk SLS basketball toss x 10 dyna disk Therapeutic Activity: SL squat x 10 LLE Modalities: Ice to Rt shoulder and Lt knee x 15 minutes  Self Care: Knee anatomy MRI review Recommended f/u with ortho    [facility] Adult PT Treatment:                                                DATE: 02/16/24 Therapeutic Exercise: Dynamic warm-up: frankensteins, walking hamstring stretch, walking quad stretch, lateral shuffle, high knees  Manual Therapy: IASTM Lt hamstring  K-tape postural corrective Rt shoulder I strip O to I upper trap, coracoid process to inferior scapular angle, coracoid process to medial scapula border 50% tension  Neuromuscular re-ed: Resisted kickback cable machine 2 x 10 @ 5 lbs  Step up knee driver to triple extension  x 10; 6 inch step + airex  Therapeutic Activity: Squat jump 2 x  10  Jog x 100 ft  Walking lunge 2 x 20 ft  A-skip x 50 ft  Modalities: Ice to Rt shoulder and Lt knee x 10 minutes          PATIENT EDUCATION:  Education details: HEP update; see treatment  Person educated: Patient Education method: Explanation, demo, cues, handout Education comprehension: verbalized understanding, returned demo   HOME EXERCISE PROGRAM: Access Code: [email or web address] URL: [email or web address] Date: 03/24/2024 Prepared by: Lucie Meeter  Exercises - Seated Hamstring Stretch  - 2 x daily - 7 x weekly - 3 sets - 30 sec  hold - Supine Static Chest Stretch on Foam Roll  - 2 x daily - 7 x weekly - 1 sets - 1 min hold - Sleeper Stretch  - 2 x daily - 7 x weekly -  3 sets - 30 sec  hold - Supine Scapular Protraction in Flexion with Dumbbells  - 1 x daily - 3 x weekly - 2 sets - 15 reps - Seated Row Cable Machine  - 1 x daily - 3 x weekly - 3 sets - 10 reps - Sidelying Shoulder ER with Towel and Dumbbell  - 1 x daily - 3 x weekly - 2 sets - 10 reps - Serratus Activation at Wall  - 1 x daily - 3 x weekly - 2 sets - 10 reps - Standing Wall [employer] with Mini Swiss Ball  - 1 x daily - 3 x weekly - 3 sets - 20 reps - Shoulder Internal Rotation with Resistance  - 1 x daily - 3 x weekly - 2 sets - 10 reps - Shoulder External Rotation with Anchored Resistance  - 1 x daily - 3 x weekly - 2 sets - 10 reps - Supine Shoulder Horizontal Abduction with Resistance  - 1 x daily - 3 x weekly - 2 sets - 10 reps - Single Leg Bridge  - 1 x daily - 3 x weekly - 2 sets - 10 reps - Seated Hamstring Curl with Anchored Resistance  - 1 x daily - 3 x weekly - 2 sets - 10 reps - [place] Deadlift With Barbell  - 1 x daily - 3 x weekly - 2 sets - 10 reps - Barbell Squat  - 1 x daily - 3 x weekly - 2 sets - 10 reps - Single Leg Lunge with Foot on Bench  - 1 x daily - 3 x weekly - 2 sets - 10 reps - Supine Hamstring Curl on Swiss Ball  - 1 x daily - 3 x weekly - 2 sets - 10 reps -  Supine Single Leg Eccentric Hamstring Bridge with Slider  - 1 x daily - 3 x weekly - 2 sets - 10 reps - Standing Single Leg Heel Raise  - 1 x daily - 3 x weekly - 3 sets - 10 reps - Runner's Step Up/Down  - 1 x daily - 3 x weekly - 2 sets - 10 reps - Single-Leg [place] Deadlift With Kettlebell  - 1 x daily - 3 x weekly - 3 sets - 10 reps - [employer] Fast  - 1 x daily - 3 x weekly - 2 sets - 10 reps - Pike on [employer]  - 1 x daily - 3 x weekly - 2 sets - 10 reps - Jump to Single Leg Stance  - 1 x daily - 3 x weekly - 2 sets - 10 reps - Squat Jumps  - 1 x daily - 3 x weekly - 2 sets - 10 reps - A Skip  - 1 x daily - 3 x weekly - 2 sets - 10 reps - Side Stepping with Resistance at Ankles  - 1 x daily - 7 x weekly - 2 sets - 10 reps - Sidelying Hip Circles  - 1 x daily - 7 x weekly - 2 sets - 10 reps - Sidelying Diagonal Hip Abduction  - 1 x daily - 7 x weekly - 2 sets - 10 reps - Clam with Resistance  - 1 x daily - 7 x weekly - 2 sets - 10 reps - Shoulder Taps on Table  - 1 x daily - 7 x weekly - 2 sets - 10 reps - Quadruped Scapular Protraction and Retraction  - 1 x daily -  7 x weekly - 2 sets - 10 reps - Quadruped Hip Abduction with Resistance Loop  - 1 x daily - 7 x weekly - 2 sets - 10 reps  ASSESSMENT:  CLINICAL IMPRESSION: Patient arrives without reports of pain. We focused on running progression and introduced UE CKC strengthening. She had mild pain about Lt knee with carioca warm-up that subsided fairly quickly. She was able to complete 2 minute jog at 60% max speed without onset of knee pain. Was recommended that she could begin 20 minute interval walk; jog (1 minute; 2 minute ratio) as part of HEP with patient verbalizing understanding. Introduced inclined jogging with patient reporting mild Lt knee pain during first 2 sets rated as 1/10. During 3rd set she had 1 instance of knee giving away/instability. No pain or instability noted during 4th and 5th set. Trial of sprinting  with patient gradually increasing running speed from 60% to 90% max with patient noting an increase in Lt knee pain rated as 4/10 at this increased speed. She tolerated shoulder taps and quadruped scapular retraction/protraction well. With quadruped arm extension this caused pain about the Rt shoulder, so was discontinued.    EVAL: Patient is a 28 y.o. female who was seen today for physical therapy evaluation and treatment for Lt knee pain and Rt shoulder pain.  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 Her Rt shoulder pain has been present for the past 5 years, but recently worsened in the fall with a blocking maneuver while playing basketball. In regards to the Lt knee she is noted to have palpable tenderness about Lt lateral hamstring and lateral joint line, pain and weakness with Lt knee MMT, and bilateral hip weakness. In regards to the Rt shoulder she has positive impingement and bicep special testing, limited shoulder flexion and IR AROM, Rt shoulder weakness, and periscapular weakness. Patient will benefit from skilled PT to address the above stated deficits in order to optimize their function and assist in overall pain reduction.    OBJECTIVE IMPAIRMENTS: decreased activity tolerance, decreased balance, decreased endurance, decreased knowledge of condition, difficulty walking, decreased ROM, decreased strength, increased fascial restrictions, impaired flexibility, impaired UE functional use, improper body mechanics, and pain.   ACTIVITY LIMITATIONS: carrying, lifting, bending, standing, squatting, sleeping, stairs, reach over head, and locomotion level  PARTICIPATION LIMITATIONS: cleaning, laundry,  shopping, community activity, and occupation  PERSONAL FACTORS: Age, Past/current experiences, Profession, and Time since onset of injury/illness/exacerbation are also affecting patient's functional outcome.   REHAB POTENTIAL: Good  CLINICAL DECISION MAKING: Evolving/moderate complexity  EVALUATION COMPLEXITY: Moderate   GOALS: Goals reviewed with patient? Yes  SHORT TERM GOALS: Target date: 01/25/2024  Patient will be independent and compliant with initial HEP.  Baseline: issued at eval Goal status: MET  2.  Patient will demonstrate at least 130 degrees of pain free Lt knee flexion AROM to improve ability to complete squatting activity.  Baseline: see above Goal status: partially met   3.  Patient will demonstrate 180 degrees of Rt shoulder flexion AROM to improve ability to block in basketball Baseline: see above Goal status: MET  4.  Patient will demonstrate at least 80 degrees of Rt shoulder IR AROM to improve ability to  complete self-care activities.  Baseline: see above  Goal status: met    LONG TERM GOALS: Target date: 04/29/24    Patient will score >/= 5 on PSFS to signify clinically meaningful improvement in functional abilities.  Baseline:  Goal status: MET   2.  Patient will squat without pain to improve ability to maintain defensive stance in basketball.  Baseline: Lt knee pain 01/13/24: mini squat without pain  Goal status: MET  3.  Patient will be able to run at least 10 minutes with </= 2/10 Lt knee pain in order to play basketball.  Baseline: unable 01/13/24: not appropriate for running given ongoing hamstring pain  01/31/24: pain with gentle plyo  03/02/24: immediate pain with jogging 5/10 Goal status: REVISED   4.  Patient will demonstrate 5/5 Lt hip strength to improve stability with jumping.  Baseline: see above Goal status: REVISED  5.  Patient will demonstrate 5/5 Rt shoulder strength to improve tolerance to basketball shooting/throwing.   Baseline: see above  Goal status: partially met    6. Patient will be able to complete max vertical jump without Lt knee pain in order to rebound basketball.   Baseline: unable    Goal Status: REVISED    7. Patient will demonstrate at least 18 kg of knee flexion average peak force strength with tension dynamometer testing to improve strength symmetry necessary for safe jump landing mechanics.    Baseline: 9.9 kg average    Goal status: NEW  PLAN:  PT FREQUENCY: 1x/week  PT DURATION: 8 weeks  PLANNED INTERVENTIONS: 97164- PT Re-evaluation, 97750- Physical Performance Testing, 97110-Therapeutic exercises, 97530- Therapeutic activity, [id number]- Neuromuscular re-education, 97535- Self Care, 02859- Manual therapy, [id number]- Gait training, [phone]- Aquatic Therapy, [phone]- Vasopneumatic device, [id number]- Ionotophoresis 4mg /ml Dexamethasone, 20560 (1-2 muscles), 20561 (3+ muscles)- Dry Needling, Taping, Cryotherapy, and Moist heat  PLAN FOR NEXT SESSION: review and progress HEP prn; Hip strengthening, shoulder/periscapular strengthening. Progress CKC as tolerated; progress running and plyo as tolerated.    Feleshia Zundel, PT, DPT, ATC 03/24/24 12:27 PM

## 2024-03-28 ENCOUNTER — Ambulatory Visit

## 2024-03-28 DIAGNOSIS — M6281 Muscle weakness (generalized): Secondary | ICD-10-CM | POA: Diagnosis not present

## 2024-03-28 DIAGNOSIS — M25562 Pain in left knee: Secondary | ICD-10-CM

## 2024-03-28 DIAGNOSIS — G8929 Other chronic pain: Secondary | ICD-10-CM

## 2024-03-28 DIAGNOSIS — M25511 Pain in right shoulder: Secondary | ICD-10-CM | POA: Diagnosis not present

## 2024-03-28 NOTE — Therapy (Signed)
 OUTPATIENT PHYSICAL THERAPY TREATMENT     Patient Name: Donna Montgomery MRN: [id number] DOB:1996-02-02, 28 y.o., female Today's Date: 03/28/2024  END OF SESSION:  PT End of Session - 03/28/24 1315     Visit Number 17    Number of Visits 22    Date for PT Re-Evaluation 04/29/24    Authorization Type [employer]    Authorization Time Period 6/30-7/29/25    Authorization - Visit Number 3    Authorization - Number of Visits 4    PT Start Time 1315    PT Stop Time 1415    PT Time Calculation (min) 60 min    Activity Tolerance Patient tolerated treatment well    Behavior During Therapy WFL for tasks assessed/performed                       Past Medical History:  Diagnosis Date   Allergy    Anemia 2014   My blood tests in college said i had low iron and I was given medication. It has been low since.   Anxiety 2014   Had anxiety my whole life, but recognized in college by health professional.   Asthma    Depression 2014   Noticed symptoms in college, was put onto two different medicines. Stopped taking medication and seeked therapy. Symptoms come and go.   Ulcer 2013   Bleeding ulcer from H. Bacteria.   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 episode (HCC) 08/05/2023   Chronic right shoulder pain 08/05/2023   Acute otitis externa of left ear 08/05/2023   Tinnitus of left ear 08/05/2023   Menorrhagia with regular cycle 10/24/2022   Iron deficiency anemia 10/24/2022   Depression, recurrent (HCC) 03/25/2020   Anxiety 03/25/2020   Chronic toe pain, right foot 07/27/2017   Asthma 08/29/2010   EXERCISE INDUCED ASTHMA 07/11/2010     PCP: Bevin Bernice RAMAN, DO   REFERRING PROVIDER: Teressa Rainell BROCKS, DO   Curtis Debby PARAS, MD     REFERRING DIAG:  [id number] (ICD-10-CM) - Internal derangement of left knee  M25.511,G89.29 (ICD-10-CM) - Chronic right shoulder pain   M23.301 (ICD-10-CM) - Degeneration of lateral meniscus, left   THERAPY DIAG:  Acute pain of left knee  Chronic right shoulder pain  Muscle weakness (generalized)  Rationale for Evaluation and Treatment: Rehabilitation  ONSET DATE: shoulder exacerbation August 2023; knee March 2024   SUBJECTIVE:   SUBJECTIVE STATEMENT:  Pain was consistent in the knee over the weekend after Friday's session rated as a 5/10 with all activity. On Monday she tried to do the leg exercises and this caused more pain. The shoulder feels the same. She took an anti-inflammatory today, so the knee doesn't hurt right now.   EVAL: Patient was playing basketball in early March and the first incident of knee pain was running to block a ball and had an ultrasound and was told by the doctor that she inflamed her hamstring tendon. She kept playing and then in the same game she got a fast break and was fouled and she landed on the players foot and her knee went out to the side.  She denies hearing any popping/clicking. She kept playing, but was limping. She continued to play in games, but did not practice until end of March. She worked with the PT while recovering, but didn't do a ton of strengthening. [place] and was referred to PT. The pain is located along the lateral hamstring. She reports there is a new sound in the knee since her injury, but does not think it is popping/clicking. Initially she had instances of the knee giving away, but not recently. Since returning home she has been resting and relaxing with pilates being her only exercise currently.   Patient reports she is right handed and blocks a lot of shots. Her Rt shoulder has been  bothering her a lot more this past year compared to previous years. It hurts to sleep on the right shoulder and feels weak when she tries to move it. She denies any popping/clicking or feelings of instability. She reports shoulder pain has been ongoing for past 5 years, but does recall blocking a shot in August and started to have more shoulder pain after this.     PERTINENT HISTORY: ORIF Lt hand  Exercise induced asthma  PAIN:  Are you having pain? Yes: NPRS scale: 2 currently in Rt shoulder; 7 at worst (knee) with jogging/jumping Pain location: Lt posterior knee; Rt shoulder Pain description: dull,ache Aggravating factors: overuse, running, jumping, reaching Relieving factors: rest   PRECAUTIONS: None  RED FLAGS: None   WEIGHT BEARING RESTRICTIONS: No  FALLS:  Has patient fallen in last 6 months? Yes. Number of falls plays basketball (multiple falls)   LIVING ENVIRONMENT: Lives with: lives with their family Lives in: House/apartment Stairs: Yes: Internal: 15 steps; on right going up Has following equipment at home: None  OCCUPATION: professional basketball player; out of season for now  PLOF: Independent  PATIENT GOALS: I want to be able to bend it. (Knee); sleep on it. (Shoulder); main goal is return to basketball   NEXT MD VISIT: nothing scheduled   OBJECTIVE:  Note: Objective measures were completed at Evaluation unless otherwise noted.  DIAGNOSTIC FINDINGS:  Lt knee MRI: IMPRESSION: 1. Free edge fraying of the posterior horn and body of the lateral meniscus without discrete radial tear or displaced meniscal fragment. 2. The medial meniscus, cruciate and collateral ligaments are intact. 3. Prominent tricompartmental degenerative changes for age, most advanced in the patellofemoral and lateral compartments. No acute osseous findings. 4. Small knee joint effusion.  Rt Shoulder X-ray: IMPRESSION: Negative radiographs of the right shoulder.  PATIENT  SURVEYS:  Patient-specific activity scoring scheme (Point to one number):  0 represents "unable to perform." 10 represents "able to perform at prior level. 0 1 2 3 4 5 6 7 8 9  10 (Date and Score) Activity Initial  Activity Eval  01/13/24  01/31/24 03/01/24  Squatting   6  7 10 10   Sleeping on Rt side  0  4 5 8   Running 0 0 0 0  Jumping  0 0 0 5  Total 1.5 2.75 3.75 5.75   Additional Additional Total score = sum of the activity scores/number of activities Minimum detectable change (90%CI) for average score = 2 points Minimum detectable change (90%CI) for single activity score = 3 points PSFS developed by: Rosalee MYRTIS Marvis KYM Charlet CHRISTELLA., & Binkley, J. (1995). Assessing disability and change on individual  patients: a report of a patient specific measure. Physiotherapy [place], 47, [phone]. Reproduced with the permission of  the authors  Score: 1.5 total    COGNITION: Overall cognitive status: Within functional limits for tasks assessed     SENSATION: Not tested  EDEMA:  No obvious swelling about the knee or shoulder   MUSCLE LENGTH: Hamstrings: Right lacking 2 deg; Left lacking 35 deg pain   POSTURE: No Significant postural limitations  PALPATION: TTP left lateral hamstring Did not palpate Rt shoulder   LOWER EXTREMITY ROM:  Active ROM Right eval Left eval 01/05/24 Left  01/13/24 Left  01/31/24 Left  03/01/24 Left   Hip flexion        Hip extension        Hip abduction        Hip adduction        Hip internal rotation        Hip external rotation        Knee flexion 135 119 pain 124 pain 130 pain  131 pain  136 pain   Knee extension Full  Full       Ankle dorsiflexion        Ankle plantarflexion        Ankle inversion        Ankle eversion         (Blank rows = not tested)  LOWER EXTREMITY MMT:  MMT Right eval Left eval 01/13/24 Left  01/27/24 Left  01/31/24 Left  03/01/24 Left  03/01/24 Right  03/28/24 Left   Hip flexion 4+ 4 4+   5 5    Hip  extension 4+ 4+ 4+  4+ 5    Hip abduction 4+ 4- pain  4  4+ 4  4+  Hip adduction          Hip internal rotation          Hip external rotation          Knee flexion 5 4+ pain  4+ pain  5 5 Pain* 115 knee flexion angle with tension dynamometer 7.5 kg, 10 kg, 12.2 kg 115 knee flexion angle with tension dynamometer 21.7 kg, 23.1 kg, 25.5 kg;   Knee extension 4+ 4+ pain 5   5 115 knee flexion angle with tension dynamometer  19 kg, 21.3 kg, 23.5 kg 115 knee flexion angle with tension dynamometer 34.3 kg, 35.4 kg, 33.8 kg   Ankle dorsiflexion          Ankle plantarflexion          Ankle inversion          Ankle eversion           (Blank rows = not tested)  UPPER EXTREMITY ROM:  Active ROM Right eval Left eval 01/13/24 Right  01/31/24 Right  03/01/24 Right   Shoulder flexion 150 180 170 175 pain Full   Shoulder extension       Shoulder abduction 180 pain 180 180 180 pain Full pain   Shoulder adduction       Shoulder extension       Shoulder internal rotation 65 85 85    Shoulder external rotation 90 90 82 pain    Elbow flexion       Elbow extension       Wrist flexion       Wrist extension       Wrist ulnar deviation       Wrist radial deviation       Wrist pronation       Wrist supination        (  Blank rows = not tested)   UPPER EXTREMITY MMT:  MMT Right eval Left eval 01/13/24 Right  01/31/24 Right  03/01/24 Right  03/28/24 Right   Shoulder flexion 4 5 4+ pain  5 5   Shoulder extension        Shoulder abduction 5 5 5 5 5  pain    Shoulder adduction        Shoulder extension        Shoulder internal rotation 5 5 5 5 5 5   Shoulder external rotation 4- pain  4 4- 4- pain 4  4+ pain  Middle trapezius 4 pain 4+ 4 pain  4 pain  4+ pain    Lower trapezius        Elbow flexion        Elbow extension        Wrist flexion        Wrist extension        Wrist ulnar deviation        Wrist radial deviation        Wrist pronation        Wrist supination        Grip strength          (Blank rows = not tested)  SPECIAL TESTS:  (+) Yergason's, Speeds, Neer's, Hawkin's Kennedy (-) Empty Can  (+) McMurray's for pain  (-) Anterior drawer, posterior drawer, valgus, varus   02/23/24 (+ pain) McMurray's and Thessaly   FUNCTIONAL TESTS:  Squat: WNL pain left lateral knee   03/02/24:  Squat: normal mechanics, pain free SLS LLE valgus collapse, increased knee pain  Jogging- immediate pain Max vertical jump- pain on landing 5/10  GAIT: Distance walked: 10 ft  Assistive device utilized: None Level of assistance: Complete Independence Comments: no obvious gait abnormalities   [facility] Adult PT Treatment:                                                DATE: 03/28/24 Therapeutic Exercise: Reviewed HEP recommending returning to exercises that are pain free.   Neuromuscular re-ed: Sidelying hip abduction 2 x 10 @ 5 lbs  SL ball toss to rebounder  x 10 each on airex, x 10 each on dynadisk; red med ball  Mini reverse lunge on slider 2 x 10 each; 2nd set discontinue with LLE sliding [employer] pickup on dynadisk 2 x 6 each   Modalities: Ice to Lt knee x 15 minutes, Game ready (vaso) Rt shoulder x 15 minutes 34 degrees, medium compression  Self Care: Hold on running due to pain with recommendation on biking for cardio activity    [facility] Adult PT Treatment:                                                DATE: 03/24/24  Neuromuscular re-ed: Fire hydrant x 10 each blue band Shoulder wall taps x 10  Quadruped scapular retraction/protraction x 10  Attempted quadruped arm extension d/c due to pain  Therapeutic Activity: Dynamic warm-up: walking hamstirng stretch, walking quad stretch, hip openers, carioca, A-skip, frankenstein Bodyweight squat x 10  Jogging 60% max speed x 2 minutes Incline jog outdoor 5 x 100 ft, 60% max speed  100 yard,  jog (60%) to 90% max speed to deceleration   Self Care: interval walk, jog 1 min;2 min keeping pain <4; 3 x weekly at most.  Begin incline  jogging at 60-75% max speed   Modalities: Ice x 15 minutes Lt knee and Rt shoulder  [facility] Adult PT Treatment:                                                DATE: 03/16/24 Therapeutic Exercise: Lateral and backward band walks 2 sets d/b x 15 ft each black band at shins  Clamshells black band 2 x 15   Neuromuscular re-ed: Standing IR/ER x 10 black band  Body blade elbow at 90 degrees neutral and pronated 2 x 20 sec Rhythmic stabilization physioball at wall 4 x 20 sec flexion  Therapeutic Activity: Squat to quick stand x 10  Squat with black band at thighs 2 x 10  Outdoor Jogging x 2 minutes Modalities: Ice x 15 minutes Lt knee and Rt shoulder Self Care: Begin interval walk, jog 2 min;1 min keeping pain <4; 3 x weekly at most.  Discussed expectations of returning to higher level activity as it relates to knee pathology    [facility] Adult PT Treatment:                                                DATE: 03/01/24 Therapeutic Exercise: Reviewed and updated HEP   Neuromuscular re-ed: Lateral lunge x 10  Sidelying leg taps x 10  Hip circles CW/CCW 2 x 10  Therapeutic Activity: lateral shuffle x 25 ft pain Jog x 50 ft pain  Re-assessment to determine overall progress, educating patient on progress towards goals  Modalities: Game ready (vaso) Lt knee x 15 minutes medium compression 34 degrees, ice to Rt shoulder x 15 minutes          PATIENT EDUCATION:  Education details: HEP review; see treatment  Person educated: Patient Education method: Explanation,  Education comprehension: verbalized understanding,   HOME EXERCISE PROGRAM: Access Code: [email or web address] URL: [email or web address] Date: 03/24/2024 Prepared by: Lucie Meeter  Exercises - Seated Hamstring Stretch  - 2 x daily - 7 x weekly - 3 sets - 30 sec  hold - Supine Static Chest Stretch on Foam Roll  - 2 x daily - 7 x weekly - 1 sets - 1 min hold - Sleeper Stretch  - 2 x daily - 7 x weekly - 3 sets - 30 sec   hold - Supine Scapular Protraction in Flexion with Dumbbells  - 1 x daily - 3 x weekly - 2 sets - 15 reps - Seated Row Cable Machine  - 1 x daily - 3 x weekly - 3 sets - 10 reps - Sidelying Shoulder ER with Towel and Dumbbell  - 1 x daily - 3 x weekly - 2 sets - 10 reps - Serratus Activation at Wall  - 1 x daily - 3 x weekly - 2 sets - 10 reps - Standing Wall [employer] with [employer]  - 1 x daily - 3 x weekly - 3 sets - 20 reps - Shoulder Internal Rotation with Resistance  - 1 x daily - 3 x weekly - 2 sets -  10 reps - Shoulder External Rotation with Anchored Resistance  - 1 x daily - 3 x weekly - 2 sets - 10 reps - Supine Shoulder Horizontal Abduction with Resistance  - 1 x daily - 3 x weekly - 2 sets - 10 reps - Single Leg Bridge  - 1 x daily - 3 x weekly - 2 sets - 10 reps - Seated Hamstring Curl with Anchored Resistance  - 1 x daily - 3 x weekly - 2 sets - 10 reps - [place] Deadlift With Barbell  - 1 x daily - 3 x weekly - 2 sets - 10 reps - Barbell Squat  - 1 x daily - 3 x weekly - 2 sets - 10 reps - Single Leg Lunge with Foot on Bench  - 1 x daily - 3 x weekly - 2 sets - 10 reps - Supine Hamstring Curl on Swiss Ball  - 1 x daily - 3 x weekly - 2 sets - 10 reps - Supine Single Leg Eccentric Hamstring Bridge with Slider  - 1 x daily - 3 x weekly - 2 sets - 10 reps - Standing Single Leg Heel Raise  - 1 x daily - 3 x weekly - 3 sets - 10 reps - Runner's Step Up/Down  - 1 x daily - 3 x weekly - 2 sets - 10 reps - Single-Leg [place] Deadlift With Kettlebell  - 1 x daily - 3 x weekly - 3 sets - 10 reps - [employer] Fast  - 1 x daily - 3 x weekly - 2 sets - 10 reps - Pike on [employer]  - 1 x daily - 3 x weekly - 2 sets - 10 reps - Jump to Single Leg Stance  - 1 x daily - 3 x weekly - 2 sets - 10 reps - Squat Jumps  - 1 x daily - 3 x weekly - 2 sets - 10 reps - A Skip  - 1 x daily - 3 x weekly - 2 sets - 10 reps - Side Stepping with Resistance at Ankles  - 1 x daily - 7 x  weekly - 2 sets - 10 reps - Sidelying Hip Circles  - 1 x daily - 7 x weekly - 2 sets - 10 reps - Sidelying Diagonal Hip Abduction  - 1 x daily - 7 x weekly - 2 sets - 10 reps - Clam with Resistance  - 1 x daily - 7 x weekly - 2 sets - 10 reps - Shoulder Taps on Table  - 1 x daily - 7 x weekly - 2 sets - 10 reps - Quadruped Scapular Protraction and Retraction  - 1 x daily - 7 x weekly - 2 sets - 10 reps - Quadruped Hip Abduction with Resistance Loop  - 1 x daily - 7 x weekly - 2 sets - 10 reps  ASSESSMENT:  CLINICAL IMPRESSION: Patient reports an increase in Lt knee pain over the weekend following PT session on Friday. The pain lasted over the weekend and interfered with her ability to complete HEP yesterday. No knee pain upon arrival, but patient attributes this to taking anti-flammatory. Mild Rt shoulder pain upon arrival. Rt rotator cuff strength has improved with slight ER weakness present, though this testing continues to elicit pain. Lt hip abductor strength has improved, but strength deficits remain notable with closed chain tasks. With lunge activity she requires mirror for visual feedback to maintain proper knee alignment and neutral trunk. No  reports of Lt knee pain with LLE lunge, but did not tolerate second set on the RLE due to Lt knee pain. Challenged with dynamic SL balance activity on unstable surface, but no onset of knee pain. Discussed gradual return to pain-free HEP at this time and hold on running given flare up of pain with patient verbalizing understanding.    EVAL: Patient is a 28 y.o. female who was seen today for physical therapy evaluation and treatment for Lt knee pain and Rt shoulder pain.  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 Her left knee pain occurred while playing professional basketball last month describing a running maneuver that injured her hamstring and then another mechanism of planting  the foot and varus movement of the knee in the same game. Her Rt shoulder pain has been present for the past 5 years, but recently worsened in the fall with a blocking maneuver while playing basketball. In regards to the Lt knee she is noted to have palpable tenderness about Lt lateral hamstring and lateral joint line, pain and weakness with Lt knee MMT, and bilateral hip weakness. In regards to the Rt shoulder she has positive impingement and bicep special testing, limited shoulder flexion and IR AROM, Rt shoulder weakness, and periscapular weakness. Patient will benefit from skilled PT to address the above stated deficits in order to optimize their function and assist in overall pain reduction.    OBJECTIVE IMPAIRMENTS: decreased activity tolerance, decreased balance, decreased endurance, decreased knowledge of condition, difficulty walking, decreased ROM, decreased strength, increased fascial restrictions, impaired flexibility, impaired UE functional use, improper body mechanics, and pain.   ACTIVITY LIMITATIONS: carrying, lifting, bending, standing, squatting, sleeping, stairs, reach over head, and locomotion level  PARTICIPATION LIMITATIONS: cleaning, laundry, shopping, community activity, and occupation  PERSONAL FACTORS: Age, Past/current experiences, Profession, and Time since onset of injury/illness/exacerbation are also affecting patient's functional outcome.   REHAB POTENTIAL: Good  CLINICAL DECISION MAKING: Evolving/moderate complexity  EVALUATION COMPLEXITY: Moderate   GOALS: Goals reviewed with patient? Yes  SHORT TERM GOALS: Target date: 01/25/2024  Patient will be independent and compliant with initial HEP.  Baseline: issued at eval Goal status: MET  2.  Patient will demonstrate at least 130 degrees of pain free Lt knee flexion AROM to improve ability to complete squatting activity.  Baseline: see above Goal status: partially met   3.  Patient will demonstrate 180  degrees of Rt shoulder flexion AROM to improve ability to block in basketball Baseline: see above Goal status: MET  4.  Patient will demonstrate at least 80 degrees of Rt shoulder IR AROM to improve ability to complete self-care activities.  Baseline: see above  Goal status: met    LONG TERM GOALS: Target date: 04/29/24    Patient will score >/= 5 on PSFS to signify clinically meaningful improvement in functional abilities.  Baseline:  Goal status: MET   2.  Patient will squat without pain to improve ability to maintain defensive stance in basketball.  Baseline: Lt knee pain 01/13/24: mini squat without pain  Goal status: MET  3.  Patient will be able to run at least 10 minutes with </= 2/10 Lt knee pain in order to play basketball.  Baseline: unable 01/13/24: not appropriate for running given ongoing hamstring pain  01/31/24: pain with gentle plyo  03/02/24: immediate pain with jogging 5/10 Goal status: REVISED   4.  Patient will demonstrate 5/5 Lt hip strength  to improve stability with jumping.  Baseline: see above Goal status: REVISED  5.  Patient will demonstrate 5/5 Rt shoulder strength to improve tolerance to basketball shooting/throwing.  Baseline: see above  Goal status: partially met    6. Patient will be able to complete max vertical jump without Lt knee pain in order to rebound basketball.   Baseline: unable    Goal Status: REVISED    7. Patient will demonstrate at least 18 kg of knee flexion average peak force strength with tension dynamometer testing to improve strength symmetry necessary for safe jump landing mechanics.    Baseline: 9.9 kg average    Goal status: NEW  PLAN:  PT FREQUENCY: 1x/week  PT DURATION: 8 weeks  PLANNED INTERVENTIONS: 97164- PT Re-evaluation, 97750- Physical Performance Testing, 97110-Therapeutic exercises, 97530- Therapeutic activity, [id number]- Neuromuscular re-education, 97535- Self Care, [id number]- Manual therapy, [id number]- Gait training,  [phone]- Aquatic Therapy, [phone]- Vasopneumatic device, [id number]- Ionotophoresis 4mg /ml Dexamethasone, 79439 (1-2 muscles), [id number] (3+ muscles)- Dry Needling, Taping, Cryotherapy, and Moist heat  PLAN FOR NEXT SESSION: review and progress HEP prn; Hip strengthening, shoulder/periscapular strengthening. Progress CKC as tolerated; progress running and plyo as tolerated. Re-assess    Maceo Hernan, PT, DPT, ATC 03/28/24 2:25 PM

## 2024-03-29 ENCOUNTER — Other Ambulatory Visit

## 2024-03-29 DIAGNOSIS — M2141 Flat foot [pes planus] (acquired), right foot: Secondary | ICD-10-CM

## 2024-03-31 ENCOUNTER — Ambulatory Visit: Admitting: Sports Medicine

## 2024-03-31 ENCOUNTER — Other Ambulatory Visit (INDEPENDENT_AMBULATORY_CARE_PROVIDER_SITE_OTHER)

## 2024-03-31 DIAGNOSIS — M23301 Other meniscus derangements, unspecified lateral meniscus, left knee: Secondary | ICD-10-CM

## 2024-03-31 DIAGNOSIS — G8929 Other chronic pain: Secondary | ICD-10-CM

## 2024-03-31 DIAGNOSIS — M25511 Pain in right shoulder: Secondary | ICD-10-CM

## 2024-03-31 MED ORDER — TRIAMCINOLONE ACETONIDE 40 MG/ML IJ SUSP
40.0000 mg | Freq: Once | INTRAMUSCULAR | Status: AC
  Administered 2024-03-31: 40 mg via INTRAMUSCULAR

## 2024-03-31 NOTE — Progress Notes (Signed)
    Procedures performed today:    Procedure: Real-time Ultrasound Guided injection of the left knee Device: Samsung HS60  Verbal informed consent obtained.  Time-out conducted.  Noted no overlying erythema, induration, or other signs of local infection.  Skin prepped in a sterile fashion.  Local anesthesia: Topical Ethyl chloride.  With sterile technique and under real time ultrasound guidance: Trace effusion noted, 1 cc Kenalog 40, 2 cc lidocaine, 2 cc bupivacaine injected easily Completed without difficulty  Advised to call if fevers/chills, erythema, induration, drainage, or persistent bleeding.  Images permanently stored and available for review in PACS.  Impression: Technically successful ultrasound guided injection.  Independent interpretation of notes and tests performed by another provider:   None.  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 She recalls having an awkward pivot and then some swelling and pain. Imaging done earlier this year did show osteoarthritis of the knee, age advanced, as well as tearing of the lateral meniscus with peripheral extrusion of the lateral meniscus, she has done physical therapy, meloxicam , unfortunately continues to have discomfort. Today we will try steroid injection, if this fails I would like a consultation with Dr. Cristy. If arthroscopic intervention not planned we will switch to viscosupplementation versus PRP. Ultimately return to see me in about 6 weeks.  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 Speed and Yergason test were negative. She had MRI done at an outside facility that showed some cuff tendinosis and subacromial bursitis however labrum was not imaged completely due to a lack of contrast.  For insurance coverage purposes she has now done 6 weeks of formal physical therapy, therapy has been done approximately 3-5 times per week, 3 times daily, 10-15 sets of each of rotator cuff strengthening as well as mobilization exercises.   X-rays were unrevealing. We will do MR arthrography.    ____________________________________________ Donna Montgomery. Curtis, M.D., ABFM., CAQSM., AME. Primary Care and Sports Medicine [facility]  Adjunct Professor of [facility] Medicine  [employer]  Restaurant manager, [employer]

## 2024-03-31 NOTE — Addendum Note (Signed)
 Addended by: OLIVA-AVELLANEDA, Greggory Safranek L on: 03/31/2024 03:56 PM   Modules accepted: Orders

## 2024-03-31 NOTE — Assessment & Plan Note (Signed)
 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 Speed and Yergason test were negative. She had MRI done at an outside facility that showed some cuff tendinosis and subacromial bursitis however labrum was not imaged completely due to a lack of contrast.  For insurance coverage purposes she has now done 6 weeks of formal physical therapy, therapy has been done approximately 3-5 times per week, 3 times daily, 10-15 sets of each of rotator cuff strengthening as well as mobilization exercises.   X-rays were unrevealing. We will do MR arthrography.

## 2024-03-31 NOTE — Assessment & Plan Note (Signed)
 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 She recalls having an awkward pivot and then some swelling and pain. Imaging done earlier this year did show osteoarthritis of the knee, age advanced, as well as tearing of the lateral meniscus with peripheral extrusion of the lateral meniscus, she has done physical therapy, meloxicam , unfortunately continues to have discomfort. Today we will try steroid injection, if this fails I would like a consultation with Dr. Cristy. If arthroscopic intervention not planned we will switch to viscosupplementation versus PRP. Ultimately return to see me in about 6 weeks.

## 2024-04-05 ENCOUNTER — Ambulatory Visit

## 2024-04-05 DIAGNOSIS — M25562 Pain in left knee: Secondary | ICD-10-CM

## 2024-04-05 DIAGNOSIS — M25511 Pain in right shoulder: Secondary | ICD-10-CM | POA: Diagnosis not present

## 2024-04-05 DIAGNOSIS — M6281 Muscle weakness (generalized): Secondary | ICD-10-CM | POA: Diagnosis not present

## 2024-04-05 DIAGNOSIS — G8929 Other chronic pain: Secondary | ICD-10-CM | POA: Diagnosis not present

## 2024-04-05 NOTE — Therapy (Signed)
 OUTPATIENT PHYSICAL THERAPY TREATMENT PHYSICAL THERAPY DISCHARGE SUMMARY  Visits from Start of Care: 18  Current functional level related to goals / functional outcomes: See goals below   Remaining deficits: Rt shoulder pain Lt knee pain    Education / Equipment: See education below    Patient agrees to discharge. Patient goals were partially met. Patient is being discharged due to maximized rehab potential.      Patient Name: JUNITA KUBOTA MRN: [id number] DOB:[birth year], 28 y.o., female Today's Date: 04/06/2024  END OF SESSION:  PT End of Session - 04/05/24 1533     Visit Number 18    Number of Visits 22    Date for PT Re-Evaluation 04/29/24    Authorization Type [employer] blue    Authorization Time Period 6/30-7/29/25    Authorization - Visit Number 4    Authorization - Number of Visits 4    PT Start Time 1533    PT Stop Time 1620    PT Time Calculation (min) 47 min    Activity Tolerance Patient tolerated treatment well    Behavior During Therapy [facility] for tasks assessed/performed                        Past Medical History:  Diagnosis Date   Allergy    Anemia 2014   My blood tests in college said i had low iron and I was given medication. It has been low since.   Anxiety 2014   Had anxiety my whole life, but recognized in college by health professional.   Asthma    Depression 2014   Noticed symptoms in college, was put onto two different medicines. Stopped taking medication and seeked therapy. Symptoms come and go.   Ulcer 2013   Bleeding ulcer from H. Bacteria.   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  episode (HCC) 08/05/2023   Chronic right shoulder pain 08/05/2023   Acute otitis externa of left ear 08/05/2023   Tinnitus of left ear 08/05/2023   Menorrhagia with regular cycle 10/24/2022   Iron deficiency anemia 10/24/2022   Depression, recurrent (HCC) 03/25/2020   Anxiety 03/25/2020   Chronic toe pain, right foot 07/27/2017   Asthma 08/29/2010   EXERCISE INDUCED ASTHMA 07/11/2010    PCP: Bevin Bernice RAMAN, DO   REFERRING PROVIDER: Teressa Rainell BROCKS, DO   Curtis Debby PARAS, MD     REFERRING DIAG:  [phone] (ICD-10-CM) - Internal derangement of left knee  M25.511,G89.29 (ICD-10-CM) - Chronic right shoulder pain   M23.301 (ICD-10-CM) - Degeneration of lateral meniscus, left   THERAPY DIAG:  Acute pain of left knee  Chronic right shoulder pain  Muscle weakness (generalized)  Rationale for Evaluation and Treatment: Rehabilitation  ONSET DATE: shoulder exacerbation August 2023; knee March 2024   SUBJECTIVE:   SUBJECTIVE STATEMENT:  Patient had f/u with Dr. ONEIDA on Friday. She had knee injection on Friday and was in a lot of pain over the weekend where it was swollen and hurt to move the knee and put weight on it. Today is the best day that it has felt and is not hurting currently. MR arthrogram  for the shoulder has been ordered. Dr. ONEIDA will refer for surgical consult if knee injection does not alleviate pain/symptom. The shoulder doesn't feel bad today, but was hurting yesterday and took OTC pain medication and iced.   EVAL: Patient was playing basketball in early March and the first incident of knee pain was running to block a ball and had an ultrasound and was told by the doctor that she inflamed her hamstring tendon. She kept playing and then in the same game she got a fast break and was fouled and she landed on the players foot and her knee went out to the side. She denies hearing any popping/clicking. She kept playing, but was limping. She continued to play in games, but did  not practice until end of March. She worked with the PT while recovering, but didn't do a ton of strengthening. [place] and was referred to PT. The pain is located along the lateral hamstring. She reports there is a new sound in the knee since her injury, but does not think it is popping/clicking. Initially she had instances of the knee giving away, but not recently. Since returning home she has been resting and relaxing with pilates being her only exercise currently.   Patient reports she is right handed and blocks a lot of shots. Her Rt shoulder has been bothering her a lot more this past year compared to previous years. It hurts to sleep on the right shoulder and feels weak when she tries to move it. She denies any popping/clicking or feelings of instability. She reports shoulder pain has been ongoing for past 5 years, but does recall blocking a shot in August and started to have more shoulder pain after this.     PERTINENT HISTORY: ORIF Lt hand  Exercise induced asthma  PAIN:  Are you having pain? Yes: NPRS scale: 2 currently in Rt shoulder; no current pain in Lt knee Pain location: Lt posterior knee; Rt shoulder Pain description: dull,ache Aggravating factors: overuse, running, jumping, reaching, basketball activity Relieving factors: rest   PRECAUTIONS: None  RED FLAGS: None   WEIGHT BEARING RESTRICTIONS: No  FALLS:  Has patient fallen in last 6 months? Yes. Number of falls plays basketball (multiple falls)   LIVING ENVIRONMENT: Lives with: lives with their family Lives in: House/apartment Stairs: Yes: Internal: 15 steps; on right going up Has following equipment at home: None  OCCUPATION: professional basketball player; out of season for now  PLOF: Independent  PATIENT GOALS: I want to be able to bend it. (Knee); sleep on it. (Shoulder); main goal is return to basketball   NEXT MD VISIT: nothing scheduled   OBJECTIVE:  Note:  Objective measures were completed at Evaluation unless otherwise noted.  DIAGNOSTIC FINDINGS:  Lt knee MRI: IMPRESSION: 1. Free edge fraying of the posterior horn and body of the lateral meniscus without discrete radial tear or displaced meniscal fragment. 2. The medial meniscus, cruciate and collateral ligaments are intact. 3. Prominent tricompartmental degenerative changes for age, most advanced in the patellofemoral and lateral compartments. No acute osseous findings. 4. Small knee joint effusion.  Rt Shoulder X-ray: IMPRESSION: Negative radiographs of the right shoulder.  PATIENT SURVEYS:  Patient-specific activity scoring scheme (Point to one number):  0 represents "unable to perform." 10 represents "able to perform at prior level. 0 1 2 3 4 5 6 7 8 9  10 (Date and Score) Activity Initial  Activity Eval  01/13/24  01/31/24 03/01/24  Squatting   6  7 10 10   Sleeping on Rt side  0  4 5 8   Running 0 0 0 0  Jumping  0 0 0 5  Total 1.5 2.75 3.75 5.75   Additional Additional Total score = sum of the activity scores/number of activities Minimum detectable change (90%CI) for average score = 2 points Minimum detectable change (90%CI) for single activity score = 3 points PSFS developed by: Rosalee MYRTIS Marvis KYM Charlet CHRISTELLA., & Binkley, J. (1995). Assessing disability and change on individual  patients: a report of a patient specific measure. Physiotherapy [place], 47, [phone]. Reproduced with the permission of the authors  Score: 1.5 total    COGNITION: Overall cognitive status: Within functional limits for tasks assessed     SENSATION: Not tested  EDEMA:  No obvious swelling about the knee or shoulder   MUSCLE LENGTH: Hamstrings: Right lacking 2 deg; Left lacking 35 deg pain   POSTURE: No Significant postural limitations  PALPATION: TTP left lateral hamstring Did not palpate Rt shoulder   LOWER EXTREMITY ROM:  Active ROM Right eval Left eval  01/05/24 Left  01/13/24 Left  01/31/24 Left  03/01/24 Left   Hip flexion        Hip extension        Hip abduction        Hip adduction        Hip internal rotation        Hip external rotation        Knee flexion 135 119 pain 124 pain 130 pain  131 pain  136 pain   Knee extension Full  Full       Ankle dorsiflexion        Ankle plantarflexion        Ankle inversion        Ankle eversion         (Blank rows = not tested)  LOWER EXTREMITY MMT:  MMT Right eval Left eval 01/13/24 Left  01/27/24 Left  01/31/24 Left  03/01/24 Left  03/01/24 Right  03/28/24 Left  04/05/24 Left   Hip flexion 4+ 4 4+   5 5   5   Hip extension 4+ 4+ 4+  4+ 5   5  Hip abduction 4+ 4- pain  4  4+ 4  4+ 5  Hip adduction           Hip internal rotation           Hip external rotation           Knee flexion 5 4+ pain  4+ pain  5 5 Pain* 115 knee flexion angle with tension dynamometer 7.5 kg, 10 kg, 12.2 kg 115 knee flexion angle with tension dynamometer 21.7 kg, 23.1 kg, 25.5 kg;  5  Knee extension 4+ 4+ pain 5   5 115 knee flexion angle with tension dynamometer  19 kg, 21.3 kg, 23.5 kg 115 knee flexion angle with tension dynamometer 34.3 kg, 35.4 kg, 33.8 kg  5  Ankle dorsiflexion           Ankle plantarflexion           Ankle inversion           Ankle eversion            (Blank rows = not tested)  UPPER EXTREMITY ROM:  Active ROM Right eval Left eval 01/13/24 Right  01/31/24 Right  03/01/24 Right   Shoulder flexion 150 180 170  175 pain Full   Shoulder extension       Shoulder abduction 180 pain 180 180 180 pain Full pain   Shoulder adduction       Shoulder extension       Shoulder internal rotation 65 85 85    Shoulder external rotation 90 90 82 pain    Elbow flexion       Elbow extension       Wrist flexion       Wrist extension       Wrist ulnar deviation       Wrist radial deviation       Wrist pronation       Wrist supination        (Blank rows = not tested)   UPPER EXTREMITY MMT:  MMT  Right eval Left eval 01/13/24 Right  01/31/24 Right  03/01/24 Right  03/28/24 Right  04/05/24 Right   Shoulder flexion 4 5 4+ pain  5 5  5   Shoulder extension         Shoulder abduction 5 5 5 5 5  pain   5  Shoulder adduction         Shoulder extension         Shoulder internal rotation 5 5 5 5 5 5 5   Shoulder external rotation 4- pain  4 4- 4- pain 4  4+ pain 5 pain   Middle trapezius 4 pain 4+ 4 pain  4 pain  4+ pain     Lower trapezius         Elbow flexion         Elbow extension         Wrist flexion         Wrist extension         Wrist ulnar deviation         Wrist radial deviation         Wrist pronation         Wrist supination         Grip strength          (Blank rows = not tested)  SPECIAL TESTS:  (+) Yergason's, Speeds, Neer's, Hawkin's Kennedy (-) Empty Can  (+) McMurray's for pain  (-) Anterior drawer, posterior drawer, valgus, varus   02/23/24 (+ pain) McMurray's and Thessaly   FUNCTIONAL TESTS:  Squat: WNL pain left lateral knee   03/02/24:  Squat: normal mechanics, pain free SLS LLE valgus collapse, increased knee pain  Jogging- immediate pain Max vertical jump- pain on landing 5/10  04/05/24: squat jump- pain free   GAIT: Distance walked: 10 ft  Assistive device utilized: None Level of assistance: Complete Independence Comments: no obvious gait abnormalities  [facility] Adult PT Treatment:                                                DATE: 04/05/24 Therapeutic Exercise: Reviewed HEP discussing frequency, sets, reps, and ways to progress independently   Therapeutic Activity: Dynamic warm-up: walking hamstring stretch, walking quad stretch, hip openers, high knees, A-skip, frankenstein Re-assessment to determine overall progress, educating patient on progress towards goals  2 minute jog, pain 2/10  Squat jumps x 10   Self Care: Discussed independent run progression: begin with interval walk:jog for 20 minutes slowly progressing to 20 minute jog with  pain as  guide. Can introduce short distance sprinting if jogging remains pain free over the next few weeks. Discussed that if pain emerges during/after running that she is to f/u with Dr. ONEIDA regarding recommendation for surgical consult Discussed jumping progression beginning with DL progressing to SL and then distance with pain as guide F/u with referring provider regarding imaging order for Rt shoulder   [facility] Adult PT Treatment:                                                DATE: 03/28/24 Therapeutic Exercise: Reviewed HEP recommending returning to exercises that are pain free.   Neuromuscular re-ed: Sidelying hip abduction 2 x 10 @ 5 lbs  SL ball toss to rebounder  x 10 each on airex, x 10 each on dynadisk; red med ball  Mini reverse lunge on slider 2 x 10 each; 2nd set discontinue with LLE sliding [employer] pickup on dynadisk 2 x 6 each   Modalities: Ice to Lt knee x 15 minutes, Game ready (vaso) Rt shoulder x 15 minutes 34 degrees, medium compression  Self Care: Hold on running due to pain with recommendation on biking for cardio activity    [facility] Adult PT Treatment:                                                DATE: 03/24/24  Neuromuscular re-ed: Fire hydrant x 10 each blue band Shoulder wall taps x 10  Quadruped scapular retraction/protraction x 10  Attempted quadruped arm extension d/c due to pain  Therapeutic Activity: Dynamic warm-up: walking hamstirng stretch, walking quad stretch, hip openers, carioca, A-skip, frankenstein Bodyweight squat x 10  Jogging 60% max speed x 2 minutes Incline jog outdoor 5 x 100 ft, 60% max speed  100 yard, jog (60%) to 90% max speed to deceleration   Self Care: interval walk, jog 1 min;2 min keeping pain <4; 3 x weekly at most.  Begin incline jogging at 60-75% max speed   Modalities: Ice x 15 minutes Lt knee and Rt shoulder  [facility] Adult PT Treatment:                                                DATE: 03/16/24 Therapeutic Exercise: Lateral  and backward band walks 2 sets d/b x 15 ft each black band at shins  Clamshells black band 2 x 15   Neuromuscular re-ed: Standing IR/ER x 10 black band  Body blade elbow at 90 degrees neutral and pronated 2 x 20 sec Rhythmic stabilization physioball at wall 4 x 20 sec flexion  Therapeutic Activity: Squat to quick stand x 10  Squat with black band at thighs 2 x 10  Outdoor Jogging x 2 minutes Modalities: Ice x 15 minutes Lt knee and Rt shoulder Self Care: Begin interval walk, jog 2 min;1 min keeping pain <4; 3 x weekly at most.  Discussed expectations of returning to higher level activity as it relates to knee pathology          PATIENT EDUCATION:  Education details: HEP review; see treatment  Person educated: Patient  Education method: Explanation, demo, handout Education comprehension: verbalized understanding,   HOME EXERCISE PROGRAM: Access Code: [email or web address] URL: [email or web address] Date: 03/24/2024 Prepared by: Lucie Meeter  Exercises - Seated Hamstring Stretch  - 2 x daily - 7 x weekly - 3 sets - 30 sec  hold - Supine Static Chest Stretch on Foam Roll  - 2 x daily - 7 x weekly - 1 sets - 1 min hold - Sleeper Stretch  - 2 x daily - 7 x weekly - 3 sets - 30 sec  hold - Supine Scapular Protraction in Flexion with Dumbbells  - 1 x daily - 3 x weekly - 2 sets - 15 reps - Seated Row Cable Machine  - 1 x daily - 3 x weekly - 3 sets - 10 reps - Sidelying Shoulder ER with Towel and Dumbbell  - 1 x daily - 3 x weekly - 2 sets - 10 reps - Serratus Activation at Wall  - 1 x daily - 3 x weekly - 2 sets - 10 reps - Standing Wall [employer] with Mini Swiss Ball  - 1 x daily - 3 x weekly - 3 sets - 20 reps - Shoulder Internal Rotation with Resistance  - 1 x daily - 3 x weekly - 2 sets - 10 reps - Shoulder External Rotation with Anchored Resistance  - 1 x daily - 3 x weekly - 2 sets - 10 reps - Supine Shoulder Horizontal Abduction with Resistance  - 1 x daily - 3 x  weekly - 2 sets - 10 reps - Single Leg Bridge  - 1 x daily - 3 x weekly - 2 sets - 10 reps - Seated Hamstring Curl with Anchored Resistance  - 1 x daily - 3 x weekly - 2 sets - 10 reps - [place] Deadlift With Barbell  - 1 x daily - 3 x weekly - 2 sets - 10 reps - Barbell Squat  - 1 x daily - 3 x weekly - 2 sets - 10 reps - Single Leg Lunge with Foot on Bench  - 1 x daily - 3 x weekly - 2 sets - 10 reps - Supine Hamstring Curl on Swiss Ball  - 1 x daily - 3 x weekly - 2 sets - 10 reps - Supine Single Leg Eccentric Hamstring Bridge with Slider  - 1 x daily - 3 x weekly - 2 sets - 10 reps - Standing Single Leg Heel Raise  - 1 x daily - 3 x weekly - 3 sets - 10 reps - Runner's Step Up/Down  - 1 x daily - 3 x weekly - 2 sets - 10 reps - Single-Leg [place] Deadlift With Kettlebell  - 1 x daily - 3 x weekly - 3 sets - 10 reps - [employer] Fast  - 1 x daily - 3 x weekly - 2 sets - 10 reps - [place] on [employer]  - 1 x daily - 3 x weekly - 2 sets - 10 reps - Jump to Single Leg Stance  - 1 x daily - 3 x weekly - 2 sets - 10 reps - Squat Jumps  - 1 x daily - 3 x weekly - 2 sets - 10 reps - A Skip  - 1 x daily - 3 x weekly - 2 sets - 10 reps - Side Stepping with Resistance at Ankles  - 1 x daily - 7 x weekly - 2 sets - 10 reps - Sidelying Hip Circles  -  1 x daily - 7 x weekly - 2 sets - 10 reps - Sidelying Diagonal Hip Abduction  - 1 x daily - 7 x weekly - 2 sets - 10 reps - Clam with Resistance  - 1 x daily - 7 x weekly - 2 sets - 10 reps - Shoulder Taps on Table  - 1 x daily - 7 x weekly - 2 sets - 10 reps - Quadruped Scapular Protraction and Retraction  - 1 x daily - 7 x weekly - 2 sets - 10 reps - Quadruped Hip Abduction with Resistance Loop  - 1 x daily - 7 x weekly - 2 sets - 10 reps  ASSESSMENT:  CLINICAL IMPRESSION: Amanie has attended 18 PT sessions since the start of care for Lt knee and Rt shoulder pain. We recently began progression into running, jumping, plyometric activity over the  past month. At recent visits her knee pain has worsened after completing these types of activity, which are necessary for return to sport. She has recent f/u with referring provider and received a knee injection with instruction that if the injection fails she will be referred for surgical consult. She arrived without knee pain today, but with short duration jogging pain is rated as a 2/10. It was recommended that she try to gradually progress back into running and jumping activity over the next few weeks, though if pain continues/worsens she should reach out to referring provider regarding need for surgical consultation with patient verbalizing understanding. In regards to the Rt shoulder she demonstrates overall improvements in ROM and strength, however her pain remains relatively unchanged and continues to interfere with basketball activity. She has plans to undergo MR arthrogram and f/u with referring provider regarding results and treatment options. She demonstrates independence with advanced home program and is appropriate for discharge at this time with patient in agreement with this plan.    EVAL: Patient is a 28 y.o. female who was seen today for physical therapy evaluation and treatment for Lt knee pain and Rt shoulder pain.  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 Her Rt shoulder pain has been present for the past 5 years, but recently worsened in the fall with a blocking maneuver while playing basketball. In regards to the Lt knee she is noted to have palpable tenderness about Lt lateral hamstring and lateral joint line, pain and weakness with Lt knee MMT, and bilateral hip weakness. In regards to the  Rt shoulder she has positive impingement and bicep special testing, limited shoulder flexion and IR AROM, Rt shoulder weakness, and periscapular weakness. Patient will benefit from skilled PT to address the above stated deficits in order to optimize their function and assist in overall pain reduction.    OBJECTIVE IMPAIRMENTS: decreased activity tolerance, decreased balance, decreased endurance, decreased knowledge of condition, difficulty walking, decreased ROM, decreased strength, increased fascial restrictions, impaired flexibility, impaired UE functional use, improper body mechanics, and pain.   ACTIVITY LIMITATIONS: carrying, lifting, bending, standing, squatting, sleeping, stairs, reach over head, and locomotion level  PARTICIPATION LIMITATIONS: cleaning, laundry, shopping, community activity, and occupation  PERSONAL FACTORS: Age, Past/current experiences, Profession, and Time since onset of injury/illness/exacerbation are also affecting patient's functional outcome.   REHAB  POTENTIAL: Good  CLINICAL DECISION MAKING: Evolving/moderate complexity  EVALUATION COMPLEXITY: Moderate   GOALS: Goals reviewed with patient? Yes  SHORT TERM GOALS: Target date: 01/25/2024  Patient will be independent and compliant with initial HEP.  Baseline: issued at eval Goal status: MET  2.  Patient will demonstrate at least 130 degrees of pain free Lt knee flexion AROM to improve ability to complete squatting activity.  Baseline: see above 04/05/24: 140 no pain  Goal status: MET  3.  Patient will demonstrate 180 degrees of Rt shoulder flexion AROM to improve ability to block in basketball Baseline: see above Goal status: MET  4.  Patient will demonstrate at least 80 degrees of Rt shoulder IR AROM to improve ability to complete self-care activities.  Baseline: see above  Goal status: met    LONG TERM GOALS: Target date: 04/29/24    Patient will score >/= 5 on PSFS to signify clinically  meaningful improvement in functional abilities.  Baseline:  Goal status: MET   2.  Patient will squat without pain to improve ability to maintain defensive stance in basketball.  Baseline: Lt knee pain 01/13/24: mini squat without pain  Goal status: MET  3.  Patient will be able to run at least 10 minutes with </= 2/10 Lt knee pain in order to play basketball.  Baseline: unable 01/13/24: not appropriate for running given ongoing hamstring pain  01/31/24: pain with gentle plyo  03/02/24: immediate pain with jogging 5/10 04/05/24: 2/10 with 2 minute jog  Goal status: NOT MET   4.  Patient will demonstrate 5/5 Lt hip strength to improve stability with jumping.  Baseline: see above Goal status: MET  5.  Patient will demonstrate 5/5 Rt shoulder strength to improve tolerance to basketball shooting/throwing.  Baseline: see above  Goal status: MET  6. Patient will be able to complete max vertical jump without Lt knee pain in order to rebound basketball.   Baseline: unable  04/05/24: no pain with squat jump, deferred max jump due to recent injection     Goal Status: not assessed     7. Patient will demonstrate at least 18 kg of knee flexion average peak force strength with tension dynamometer testing to improve strength symmetry necessary for safe jump landing mechanics.    Baseline: 9.9 kg average    04/05/24: unable to check, do not have equipment at today's visit    Goal status: unable to assess   PLAN:  PT FREQUENCY: 1x/week  PT DURATION: 8 weeks  PLANNED INTERVENTIONS: 97164- PT Re-evaluation, 97750- Physical Performance Testing, 97110-Therapeutic exercises, 97530- Therapeutic activity, [id number]- Neuromuscular re-education, 97535- Self Care, [id number]- Manual therapy, [id number]- Gait training, [phone]- Aquatic Therapy, [phone]- Vasopneumatic device, [phone]- Ionotophoresis 4mg /ml Dexamethasone, 79439 (1-2 muscles), 20561 (3+ muscles)- Dry Needling, Taping, Cryotherapy, and Moist heat    Lucie Meeter,  PT, DPT, ATC 04/06/24 12:45 PM

## 2024-04-12 ENCOUNTER — Encounter

## 2024-04-12 ENCOUNTER — Telehealth: Admitting: Physician Assistant

## 2024-04-12 ENCOUNTER — Telehealth: Payer: Self-pay | Admitting: Family Medicine

## 2024-04-12 DIAGNOSIS — J069 Acute upper respiratory infection, unspecified: Secondary | ICD-10-CM

## 2024-04-12 MED ORDER — BENZONATATE 200 MG PO CAPS: 200.0000 mg | ORAL_CAPSULE | Freq: Two times a day (BID) | ORAL | 0 refills | Status: DC | PRN

## 2024-04-12 NOTE — Progress Notes (Signed)
 I have spent 5 minutes in review of e-visit questionnaire, review and updating patient chart, medical decision making and response to patient.   Piedad Climes, PA-C

## 2024-04-12 NOTE — Telephone Encounter (Signed)
 Received fax from [facility] regarding the pt's PA [phone]) They have stated that they did not find the member in there database. Requested that we resubmit and review with at least 3 points of PHI:  Member demographics ( name, DOB, address, etc) Member's insurance information (ID usually ending in 001 or scanned card) Any Auth# or Reference  Please advise. Is this needing me sent via [employer] or [employer]?

## 2024-04-12 NOTE — Progress Notes (Signed)

## 2024-04-13 NOTE — Telephone Encounter (Signed)
 Will do!

## 2024-05-03 ENCOUNTER — Encounter: Payer: Self-pay | Admitting: Sports Medicine

## 2024-05-12 ENCOUNTER — Other Ambulatory Visit: Payer: Self-pay | Admitting: Family Medicine

## 2024-05-12 DIAGNOSIS — G8929 Other chronic pain: Secondary | ICD-10-CM

## 2024-05-16 ENCOUNTER — Encounter: Payer: Self-pay | Admitting: Sports Medicine

## 2024-05-23 DIAGNOSIS — M25511 Pain in right shoulder: Secondary | ICD-10-CM | POA: Diagnosis not present

## 2024-05-25 ENCOUNTER — Other Ambulatory Visit: Payer: Self-pay

## 2024-05-25 ENCOUNTER — Encounter (HOSPITAL_BASED_OUTPATIENT_CLINIC_OR_DEPARTMENT_OTHER): Payer: Self-pay | Admitting: Orthopaedic Surgery

## 2024-05-25 NOTE — Progress Notes (Signed)
   05/25/24 1036  PAT Phone Screen  Is the patient taking a GLP-1 receptor agonist? No  Do You Have Diabetes? No  Do You Have Hypertension? No  Have You Ever Been to the ER for Asthma? No  Have You Taken Oral Steroids in the Past 3 Months? No  Do you Take Phenteramine or any Other Diet Drugs? No  Recent  Lab Work, EKG, CXR? No  Do you have a history of heart problems? No  Cardiologist Name Dr Swaziland OV 05/08/2019 - see below  Any Recent Hospitalizations? No  Height 6' 5 (1.956 m)  Weight 72.6 kg  Pat Appointment Scheduled No    'Heart murmur. 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 She appears to be on the mildly end of the spectrum. It sounds like she was evaluated with Echo in [place] and was told she could continue to play ball. Normal ETT. I agree that there is no limitation to her ability to play competitive sports'.  - from OV w/ Dr Sw[place]and 05/08/19.  Full note in Epic reviewed with Dr Paul. Ok to proceed as planned with upcoming surgery at [facility].

## 2024-05-26 NOTE — Anesthesia Preprocedure Evaluation (Addendum)
 Anesthesia Evaluation  Patient identified by MRN, date of birth, ID band Patient awake    Reviewed: Allergy & Precautions, NPO status , Patient's Chart, lab work & pertinent test results  Airway Mallampati: I  TM Distance: >3 FB Neck ROM: Full    Dental  (+) Teeth Intact, Dental Advisory Given   Pulmonary asthma (well controlled)    Pulmonary exam normal breath sounds clear to auscultation       Cardiovascular Normal cardiovascular exam+ Valvular Problems/Murmurs (Mitral regurgitation)  Rhythm:Regular Rate:Normal     Neuro/Psych  PSYCHIATRIC DISORDERS Anxiety Depression    negative neurological ROS     GI/Hepatic negative GI ROS, Neg liver ROS,,,  Endo/Other  negative endocrine ROS    Renal/GU negative Renal ROS  negative genitourinary   Musculoskeletal  (+) Arthritis , Osteoarthritis,    Abdominal   Peds  Hematology negative hematology ROS (+)   Anesthesia Other Findings   Reproductive/Obstetrics negative OB ROS Urine preg neg                              Anesthesia Physical Anesthesia Plan  ASA: 2  Anesthesia Plan: General and Regional   Post-op Pain Management: Regional block*, Tylenol  PO (pre-op)* and Toradol  IV (intra-op)*   Induction: Intravenous  PONV Risk Score and Plan: 3 and Ondansetron , Dexamethasone , Midazolam , Treatment may vary due to age or medical condition and Scopolamine  patch - Pre-op  Airway Management Planned: Oral ETT  Additional Equipment: None  Intra-op Plan:   Post-operative Plan: Extubation in OR  Informed Consent:   Plan Discussed with:   Anesthesia Plan Comments:         Anesthesia Quick Evaluation

## 2024-05-31 NOTE — H&P (Signed)
 PREOPERATIVE H&P  Chief Complaint: Articular cartilage disorder of shoulder, right, Subluxation of right shoulder joint Primary osteoarthritis of right shoulder,Impingement syndrome of right shoulder Bicipital tendinitis of right shoulder  HPI: Donna Montgomery is a 28 y.o. female who is scheduled for, Procedure(s): ARTHROSCOPY, SHOULDER WITH DEBRIDEMENT.   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 She is not currently signed with a team. She has had right shoulder pain for a year. She had an injury while she was playing and felt her arm slide in an irregular way and she had pain which made her fall to the ground. She has performed physical therapy for the better part of the year in training. She has not had any surgery.   Symptoms are rated as moderate to severe, and have been worsening.  This is significantly impairing activities of daily living.    Please see clinic note for further details on this patient's care.    She has elected for surgical management.   Past Medical History:  Diagnosis Date   Allergy    Anemia 2014   My blood tests in college said i had low iron and I was given medication. It has been low since.   Anxiety 2014   Had anxiety my whole life, but recognized in college by health professional.   Asthma    Depression 2014   Noticed symptoms in college, was put onto two different medicines. Stopped taking medication and seeked therapy. Symptoms come and go.   Heart murmur 05/08/2019   Ulcer 2013   Bleeding ulcer from H. Bacteria.   Past Surgical History:  Procedure Laterality Date   FRACTURE SURGERY  2017   Hand fracture   OPEN REDUCTION INTERNAL FIXATION (ORIF) HAND Left 2017   Social History   Socioeconomic History   Marital status: Single    Spouse name: Not on file   Number of children: Not on file   Years of education: Not on file   Highest education level: Bachelor's  degree (e.g., BA, AB, BS)  Occupational History   Not on file  Tobacco Use   Smoking status: Never   Smokeless tobacco: Never  Vaping Use   Vaping status: Never Used  Substance and Sexual Activity   Alcohol use: Not Currently    Comment: 1-2 glasses of wine per month   Drug use: No   Sexual activity: Not Currently    Birth control/protection: Inserts  Other Topics Concern   Not on file  Social History Narrative   Not on file   Social Drivers of Health   Financial Resource Strain: Medium Risk (03/31/2024)   Overall Financial Resource Strain (CARDIA)    Difficulty of Paying Living Expenses: Somewhat hard  Food Insecurity: No Food Insecurity (03/31/2024)   Hunger Vital Sign    Worried About Running Out of Food in the Last Year: Never true    Ran Out of Food in the Last Year: Never true  Transportation Needs: No Transportation Needs (03/31/2024)   PRAPARE - Administrator, Civil Service (Medical): No    Lack of Transportation (Non-Medical): No  Physical Activity: Sufficiently Active (03/31/2024)   Exercise Vital Sign    Days of Exercise per Week: 6 days    Minutes of Exercise per Session: 30 min  Stress: Stress Concern Present (03/31/2024)   [employer] of Occupational Health - Occupational Stress Questionnaire  Feeling of Stress: To some extent  Social Connections: Moderately Isolated (03/31/2024)   Social Connection and Isolation Panel    Frequency of Communication with Friends and Family: More than three times a week    Frequency of Social Gatherings with Friends and Family: More than three times a week    Attends Religious Services: 1 to 4 times per year    Active Member of [employer] or Organizations: No    Attends Engineer, structural: Not on file    Marital Status: Never married   Family History  Problem Relation Age of Onset   Anxiety disorder Mother    Miscarriages / [place] Mother    Early death Sister    Asthma Other    Alcohol abuse  Maternal Grandfather    Stroke Maternal Grandmother    Varicose Veins Maternal Grandmother    Drug abuse Paternal Grandfather    ADD / ADHD Maternal Aunt    ADD / ADHD Maternal Uncle    Allergies  Allergen Reactions   Pollen Extract Cough and Other (See Comments)   Other     Allergic to cats   Prior to Admission medications   Medication Sig Start Date End Date Taking? Authorizing Provider  etonogestrel -ethinyl estradiol  (NUVARING) 0.12-0.015 MG/24HR vaginal ring Insert vaginally and leave in place for 3 consecutive weeks, then remove for 1 week. 07/15/23  Yes Cris Burnard DEL, MD  FLUoxetine  (PROZAC ) 20 MG tablet TAKE 1 TABLET BY MOUTH DAILY 10/18/23  Yes Wachs, Erika S, DO  Iron-Vitamin C 65-125 MG TABS Take by mouth.   Yes [provider]  Multiple Minerals (JOINT HEALTH MINERAL PO) Take by mouth.   Yes [provider]    ROS: All other systems have been reviewed and were otherwise negative with the exception of those mentioned in the HPI and as above.  Physical Exam: General: Alert, no acute distress Cardiovascular: No pedal edema Respiratory: No cyanosis, no use of accessory musculature GI: No organomegaly, abdomen is soft and non-tender Skin: No lesions in the area of chief complaint Neurologic: Sensation intact distally Psychiatric: Patient is competent for consent with normal mood and affect Lymphatic: No axillary or cervical lymphadenopathy  MUSCULOSKELETAL:  The range of motion of the right shoulder is full, almost hypermobile to 180 degrees of forward flexion on the left, the right actually has 170 degrees. She has apprehension in the anterior apprehension position with improvement with a posterior directed force.  She has a grossly positive O'Brien's and Speed's. Positive cross body. Negative AC tenderness to palpation.  Imaging: Right shoulder MRI demonstrates some irregularity in the anterior and inferior labrum, although it is difficult to assess  fully without contrast. She also appears to have biceps tendinitis as well. There is a lateral hanging acromial spur and some early edema in the distal clavicle.    Assessment: Articular cartilage disorder of shoulder, right, Subluxation of right shoulder joint Primary osteoarthritis of right shoulder,Impingement syndrome of right shoulder Bicipital tendinitis of right shoulder  Plan: Plan for Procedure(s): ARTHROSCOPY, SHOULDER WITH DEBRIDEMENT  The risks benefits and alternatives were discussed with the patient including but not limited to the risks of nonoperative treatment, versus surgical intervention including infection, bleeding, nerve injury,  blood clots, cardiopulmonary complications, morbidity, mortality, among others, and they were willing to proceed.   The patient acknowledged the explanation, agreed to proceed with the plan and consent was signed.   Operative Plan: right shoulder scope with biceps tenodesis, subacromial decompression, distal clavicle excision  and possible Bankart repair  Discharge Medications: standard DVT Prophylaxis: none Physical Therapy: outpatient PT Special Discharge needs: Sling. IceMan   Aleck LOISE Stalling, PA-C  05/31/2024 1:20 PM

## 2024-05-31 NOTE — Discharge Instructions (Signed)
 Bonner Hair MD, MPH Aleck Stalling, PA-C [facility] [address], [address] [phone] (tel)   [phone] (fax)   POST-OPERATIVE INSTRUCTIONS - SHOULDER ARTHROSCOPY  WOUND CARE You may remove the Operative Dressing on Post-Op Day #3 (72hrs after surgery).   Alternatively if you would like you can leave dressing on until follow-up if within 7-8 days but keep it dry. Leave steri-strips in place until they fall off on their own, usually 2 weeks postop. There may be a small amount of fluid/bleeding leaking at the surgical site.  This is normal; the shoulder is filled with fluid during the procedure and can leak for 24-48hrs after surgery.  You may change/reinforce the bandage as needed.  Use the Cryocuff or Ice as often as possible for the first 7 days, then as needed for pain relief. Always keep a towel, ACE wrap or other barrier between the cooling unit and your skin.  You may shower on Post-Op Day #3. Gently pat the area dry.  Do not soak the shoulder in water or submerge it.  Keep incisions as dry as possible. Do not go swimming in the pool or ocean until 4 weeks after surgery or when otherwise instructed.    EXERCISES Wear the sling at all times  You may remove the sling for showering, but keep the arm across the chest or in a secondary sling.     It is normal for your fingers/hand to become more swollen after surgery and discolored from bruising.   This will resolve over the first few weeks usually after surgery. Please continue to ambulate and do not stay sitting or lying for too long.  Perform foot and wrist pumps to assist in circulation.  PHYSICAL THERAPY - You will begin physical therapy soon after surgery (unless otherwise specified) - Please call to set up an appointment, if you do not already have one  - Let our office if there are any issues with scheduling your therapy   - A PT referral was sent to [facility]  REGIONAL ANESTHESIA  (NERVE BLOCKS) The anesthesia team may have performed a nerve block for you this is a great tool used to minimize pain.   The block may start wearing off overnight (between 8-24 hours postop) When the block wears off, your pain may go from nearly zero to the pain you would have had postop without the block. This is an abrupt transition but nothing dangerous is happening.   This can be a challenging period but utilize your as needed pain medications to try and manage this period. We suggest you use the pain medication the first night prior to going to bed, to ease this transition.  You may take an extra dose of narcotic when this happens if needed  POST-OP MEDICATIONS- Multimodal approach to pain control In general your pain will be controlled with a combination of substances.  Prescriptions unless otherwise discussed are electronically sent to your pharmacy.  This is a carefully made plan we use to minimize narcotic use.     Meloxicam  - Anti-inflammatory medication taken on a scheduled basis Acetaminophen  - Non-narcotic pain medicine taken on a scheduled basis  Oxycodone  - This is a strong narcotic, to be used only on an "as needed" basis for SEVERE pain. Zofran  - take as needed for nausea   FOLLOW-UP If you develop a Fever (>=101.5), Redness or Drainage from the surgical incision site, please call our office to arrange for an evaluation. Please call  the office to schedule a follow-up appointment for your first post-operative appointment, 7-10 days post-operatively.    HELPFUL INFORMATION   You may be more comfortable sleeping in a semi-seated position the first few nights following surgery.  Keep a pillow propped under the elbow and forearm for comfort.  If you have a recliner type of chair it might be beneficial.  If not that is fine too, but it would be helpful to sleep propped up with pillows behind your operated shoulder as well under your elbow and forearm.  This will reduce pulling  on the suture lines.  When dressing, put your operative arm in the sleeve first.  When getting undressed, take your operative arm out last.  Loose fitting, button-down shirts are recommended.  Often in the first days after surgery you may be more comfortable keeping your operative arm under your shirt and not through the sleeve.  You may return to work/school in the next couple of days when you feel up to it.  Desk work and typing in the sling is fine.  We suggest you use the pain medication the first night prior to going to bed, in order to ease any pain when the anesthesia wears off. You should avoid taking pain medications on an empty stomach as it will make you nauseous.  You should wean off your narcotic medicines as soon as you are able.  Most patients will be off narcotics before their first postop appointment.   Do not drink alcoholic beverages or take illicit drugs when taking pain medications.  It is against the law to drive while taking narcotics.  In some states it is against the law to drive while your arm is in a sling.   Pain medication may make you constipated.  Below are a few solutions to try in this order: Decrease the amount of pain medication if you aren't having pain. Drink lots of decaffeinated fluids. Drink prune juice and/or eat dried prunes  If the first 3 don't work start with additional solutions Take Colace - an over-the-counter stool softener Take Senokot - an over-the-counter laxative Take Miralax - a stronger over-the-counter laxative  For more information including helpful videos and documents visit our website:   [email or web address]-[email or web address]    No Tylenol  before 12:30pm today. No Ibuprofen /NSAIDS before 6pm tonight.   Post Anesthesia Home Care Instructions  Activity: Get plenty of rest for the remainder of the day. A responsible individual must stay with you for 24 hours following the procedure.  For the next 24 hours, DO  NOT: -Drive a car -Advertising copywriter -Drink alcoholic beverages -Take any medication unless instructed by your physician -Make any legal decisions or sign important papers.  Meals: Start with liquid foods such as gelatin or soup. Progress to regular foods as tolerated. Avoid greasy, spicy, heavy foods. If nausea and/or vomiting occur, drink only clear liquids until the nausea and/or vomiting subsides. Call your physician if vomiting continues.  Special Instructions/Symptoms: Your throat may feel dry or sore from the anesthesia or the breathing tube placed in your throat during surgery. If this causes discomfort, gargle with warm salt water. The discomfort should disappear within 24 hours.  If you had a scopolamine  patch placed behind your ear for the management of post- operative nausea and/or vomiting:  1. The medication in the patch is effective for 72 hours, after which it should be removed.  Wrap patch in a tissue and discard in the trash. Wash hands thoroughly with soap and water.  2. You may remove the patch earlier than 72 hours if you experience unpleasant side effects which may include dry mouth, dizziness or visual disturbances. 3. Avoid touching the patch. Wash your hands with soap and water after contact with the patch.   Regional Anesthesia Blocks  1. You may not be able to move or feel the blocked extremity after a regional anesthetic block. This may last may last from 3-48 hours after placement, but it will go away. The length of time depends on the medication injected and your individual response to the medication. As the nerves start to wake up, you may experience tingling as the movement and feeling returns to your extremity. If the numbness and inability to move your extremity has not gone away after 48 hours, please call your surgeon.   2. The extremity that is blocked will need to be protected until the numbness is gone and the strength has returned. Because you cannot  feel it, you will need to take extra care to avoid injury. Because it may be weak, you may have difficulty moving it or using it. You may not know what position it is in without looking at it while the block is in effect.  3. For blocks in the legs and feet, returning to weight bearing and walking needs to be done carefully. You will need to wait until the numbness is entirely gone and the strength has returned. You should be able to move your leg and foot normally before you try and bear weight or walk. You will need someone to be with you when you first try to ensure you do not fall and possibly risk injury.  4. Bruising and tenderness at the needle site are common side effects and will resolve in a few days.  5. Persistent numbness or new problems with movement should be communicated to the surgeon or the [facility] [phone] [facility] [phone]).Information for Discharge Teaching: EXPAREL  (bupivacaine  liposome injectable suspension)   Pain relief is important to your recovery. The goal is to control your pain so you can move easier and return to your normal activities as soon as possible after your procedure. Your physician may use several types of medicines to manage pain, swelling, and more.  Your surgeon or anesthesiologist gave you EXPAREL (bupivacaine ) to help control your pain after surgery.  EXPAREL  is a local anesthetic designed to release slowly over an extended period of time to provide pain relief by numbing the tissue around the surgical site. EXPAREL  is designed to release pain medication over time and can control pain for up to 72 hours. Depending on how you respond to EXPAREL , you may require less pain medication during your recovery. EXPAREL  can help reduce or eliminate the need for opioids during the first few days after surgery when pain relief is needed the most. EXPAREL  is not an opioid and is not addictive. It does not cause sleepiness or  sedation.   Important! A teal colored band has been placed on your arm with the date, time and amount of EXPAREL  you have received. Please leave this armband in place for the full 96 hours following administration, and then you may remove the band. If you return to the hospital for any reason within 96 hours following the administration of EXPAREL , the armband provides important information that your health care providers to know, and alerts them that you have received this anesthetic.    Possible side effects of EXPAREL : Temporary loss of  sensation or ability to move in the area where medication was injected. Nausea, vomiting, constipation Rarely, numbness and tingling in your mouth or lips, lightheadedness, or anxiety may occur. Call your doctor right away if you think you may be experiencing any of these sensations, or if you have other questions regarding possible side effects.  Follow all other discharge instructions given to you by your surgeon or nurse. Eat a healthy diet and drink plenty of water or other fluids.

## 2024-06-01 ENCOUNTER — Encounter (HOSPITAL_BASED_OUTPATIENT_CLINIC_OR_DEPARTMENT_OTHER): Payer: Self-pay | Admitting: Orthopaedic Surgery

## 2024-06-01 ENCOUNTER — Ambulatory Visit (HOSPITAL_BASED_OUTPATIENT_CLINIC_OR_DEPARTMENT_OTHER): Payer: Self-pay | Admitting: Anesthesiology

## 2024-06-01 ENCOUNTER — Ambulatory Visit (HOSPITAL_BASED_OUTPATIENT_CLINIC_OR_DEPARTMENT_OTHER)
Admission: RE | Admit: 2024-06-01 | Discharge: 2024-06-01 | Disposition: A | Discharge status: Home / Self Care | Attending: Orthopaedic Surgery | Admitting: Orthopaedic Surgery

## 2024-06-01 ENCOUNTER — Encounter (HOSPITAL_BASED_OUTPATIENT_CLINIC_OR_DEPARTMENT_OTHER)
Admission: RE | Disposition: A | Discharge status: Home / Self Care | Payer: Self-pay | Source: Home / Self Care | Attending: Orthopaedic Surgery

## 2024-06-01 DIAGNOSIS — M19011 Primary osteoarthritis, right shoulder: Secondary | ICD-10-CM | POA: Diagnosis not present

## 2024-06-01 DIAGNOSIS — F419 Anxiety disorder, unspecified: Secondary | ICD-10-CM | POA: Insufficient documentation

## 2024-06-01 DIAGNOSIS — M25311 Other instability, right shoulder: Secondary | ICD-10-CM | POA: Insufficient documentation

## 2024-06-01 DIAGNOSIS — S43431A Superior glenoid labrum lesion of right shoulder, initial encounter: Secondary | ICD-10-CM | POA: Diagnosis not present

## 2024-06-01 DIAGNOSIS — S43001A Unspecified subluxation of right shoulder joint, initial encounter: Secondary | ICD-10-CM | POA: Diagnosis not present

## 2024-06-01 DIAGNOSIS — X58XXXA Exposure to other specified factors, initial encounter: Secondary | ICD-10-CM | POA: Insufficient documentation

## 2024-06-01 DIAGNOSIS — Z01818 Encounter for other preprocedural examination: Secondary | ICD-10-CM

## 2024-06-01 DIAGNOSIS — M7541 Impingement syndrome of right shoulder: Secondary | ICD-10-CM | POA: Diagnosis not present

## 2024-06-01 DIAGNOSIS — M7521 Bicipital tendinitis, right shoulder: Secondary | ICD-10-CM

## 2024-06-01 DIAGNOSIS — G8918 Other acute postprocedural pain: Secondary | ICD-10-CM | POA: Diagnosis not present

## 2024-06-01 DIAGNOSIS — M24111 Other articular cartilage disorders, right shoulder: Secondary | ICD-10-CM | POA: Diagnosis not present

## 2024-06-01 DIAGNOSIS — F32A Depression, unspecified: Secondary | ICD-10-CM | POA: Insufficient documentation

## 2024-06-01 HISTORY — PX: POSTERIOR LUMBAR FUSION 2 WITH HARDWARE REMOVAL: SHX7297

## 2024-06-01 LAB — POCT PREGNANCY, URINE: Preg Test, Ur: NEGATIVE

## 2024-06-01 SURGERY — ARTHROSCOPY, SHOULDER WITH DEBRIDEMENT
Anesthesia: Regional | Site: Shoulder | Laterality: Right

## 2024-06-01 MED ORDER — SUCCINYLCHOLINE CHLORIDE 200 MG/10ML IV SOSY
PREFILLED_SYRINGE | INTRAVENOUS | Status: AC
Start: 2024-06-01 — End: 2024-06-01
  Filled 2024-06-01: qty 10

## 2024-06-01 MED ORDER — HYDROMORPHONE HCL 1 MG/ML IJ SOLN
INTRAMUSCULAR | Status: AC
  Filled 2024-06-01: qty 0.5

## 2024-06-01 MED ORDER — CEFAZOLIN SODIUM-DEXTROSE 2-4 GM/100ML-% IV SOLN
INTRAVENOUS | Status: AC
  Filled 2024-06-01: qty 100

## 2024-06-01 MED ORDER — PROPOFOL 10 MG/ML IV BOLUS
INTRAVENOUS | Status: DC | PRN
  Administered 2024-06-01: 200 mg via INTRAVENOUS

## 2024-06-01 MED ORDER — ONDANSETRON HCL 4 MG/2ML IJ SOLN
INTRAMUSCULAR | Status: AC
Start: 2024-06-01 — End: 2024-06-01
  Filled 2024-06-01: qty 2

## 2024-06-01 MED ORDER — HYDROMORPHONE HCL 1 MG/ML IJ SOLN
0.2500 mg | INTRAMUSCULAR | Status: DC | PRN
  Administered 2024-06-01 (×2): 0.5 mg via INTRAVENOUS

## 2024-06-01 MED ORDER — ROCURONIUM BROMIDE 100 MG/10ML IV SOLN
INTRAVENOUS | Status: DC | PRN
  Administered 2024-06-01: 60 mg via INTRAVENOUS

## 2024-06-01 MED ORDER — ACETAMINOPHEN 500 MG PO TABS: 1000.0000 mg | ORAL_TABLET | Freq: Three times a day (TID) | ORAL | 0 refills | Status: AC

## 2024-06-01 MED ORDER — EPHEDRINE SULFATE (PRESSORS) 50 MG/ML IJ SOLN
INTRAMUSCULAR | Status: DC | PRN
  Administered 2024-06-01: 10 mg via INTRAVENOUS
  Administered 2024-06-01: 5 mg via INTRAVENOUS

## 2024-06-01 MED ORDER — MEPERIDINE HCL 25 MG/ML IJ SOLN
INTRAMUSCULAR | Status: AC
  Filled 2024-06-01: qty 1

## 2024-06-01 MED ORDER — KETOROLAC TROMETHAMINE 30 MG/ML IJ SOLN
INTRAMUSCULAR | Status: AC
Start: 2024-06-01 — End: 2024-06-01
  Filled 2024-06-01: qty 1

## 2024-06-01 MED ORDER — FENTANYL CITRATE (PF) 100 MCG/2ML IJ SOLN
INTRAMUSCULAR | Status: DC | PRN
  Administered 2024-06-01 (×2): 50 ug via INTRAVENOUS

## 2024-06-01 MED ORDER — MEPERIDINE HCL 25 MG/ML IJ SOLN
6.2500 mg | INTRAMUSCULAR | Status: DC | PRN
  Administered 2024-06-01: 12.5 mg via INTRAVENOUS

## 2024-06-01 MED ORDER — CEFAZOLIN SODIUM-DEXTROSE 2-4 GM/100ML-% IV SOLN
2.0000 g | INTRAVENOUS | Status: AC
  Administered 2024-06-01: 2 g via INTRAVENOUS

## 2024-06-01 MED ORDER — DEXAMETHASONE SODIUM PHOSPHATE 10 MG/ML IJ SOLN
INTRAMUSCULAR | Status: AC
  Filled 2024-06-01: qty 1

## 2024-06-01 MED ORDER — ACETAMINOPHEN 500 MG PO TABS
ORAL_TABLET | ORAL | Status: AC
  Filled 2024-06-01: qty 2

## 2024-06-01 MED ORDER — OXYCODONE HCL 5 MG/5ML PO SOLN: 5.0000 mg | Freq: Once | ORAL | Status: DC | PRN

## 2024-06-01 MED ORDER — KETOROLAC TROMETHAMINE 30 MG/ML IJ SOLN
30.0000 mg | Freq: Once | INTRAMUSCULAR | Status: AC | PRN
  Administered 2024-06-01: 30 mg via INTRAVENOUS

## 2024-06-01 MED ORDER — DEXAMETHASONE SODIUM PHOSPHATE 10 MG/ML IJ SOLN
INTRAMUSCULAR | Status: DC | PRN
  Administered 2024-06-01: 10 mg via INTRAVENOUS

## 2024-06-01 MED ORDER — MIDAZOLAM HCL 2 MG/2ML IJ SOLN
INTRAMUSCULAR | Status: AC
  Filled 2024-06-01: qty 2

## 2024-06-01 MED ORDER — MIDAZOLAM HCL 2 MG/2ML IJ SOLN
2.0000 mg | Freq: Once | INTRAMUSCULAR | Status: AC
  Administered 2024-06-01: 2 mg via INTRAVENOUS

## 2024-06-01 MED ORDER — ONDANSETRON HCL 4 MG/2ML IJ SOLN
INTRAMUSCULAR | Status: DC | PRN
  Administered 2024-06-01: 4 mg via INTRAVENOUS

## 2024-06-01 MED ORDER — ROCURONIUM BROMIDE 10 MG/ML (PF) SYRINGE
PREFILLED_SYRINGE | INTRAVENOUS | Status: AC
Start: 2024-06-01 — End: 2024-06-01
  Filled 2024-06-01: qty 10

## 2024-06-01 MED ORDER — ATROPINE SULFATE 0.4 MG/ML IV SOLN
INTRAVENOUS | Status: AC
  Filled 2024-06-01: qty 1

## 2024-06-01 MED ORDER — PROPOFOL 10 MG/ML IV BOLUS
INTRAVENOUS | Status: AC
Start: 2024-06-01 — End: 2024-06-01
  Filled 2024-06-01: qty 20

## 2024-06-01 MED ORDER — BUPIVACAINE LIPOSOME 1.3 % IJ SUSP
INTRAMUSCULAR | Status: DC | PRN
  Administered 2024-06-01 (×2): 10 mL via PERINEURAL

## 2024-06-01 MED ORDER — OXYCODONE HCL 5 MG PO TABS: ORAL_TABLET | ORAL | 0 refills | Status: AC

## 2024-06-01 MED ORDER — MELOXICAM 7.5 MG PO TABS
7.5000 mg | ORAL_TABLET | Freq: Two times a day (BID) | ORAL | 0 refills | Status: AC
Start: 2024-06-01 — End: 2024-07-01

## 2024-06-01 MED ORDER — SCOPOLAMINE 1 MG/3DAYS TD PT72
1.0000 | MEDICATED_PATCH | TRANSDERMAL | Status: DC
  Administered 2024-06-01: 1 mg via TRANSDERMAL

## 2024-06-01 MED ORDER — FENTANYL CITRATE (PF) 100 MCG/2ML IJ SOLN
INTRAMUSCULAR | Status: AC
  Filled 2024-06-01: qty 2

## 2024-06-01 MED ORDER — DEXMEDETOMIDINE HCL IN NACL 80 MCG/20ML IV SOLN
INTRAVENOUS | Status: AC
  Filled 2024-06-01: qty 40

## 2024-06-01 MED ORDER — FENTANYL CITRATE (PF) 100 MCG/2ML IJ SOLN
100.0000 ug | Freq: Once | INTRAMUSCULAR | Status: AC
  Administered 2024-06-01: 100 ug via INTRAVENOUS

## 2024-06-01 MED ORDER — AMISULPRIDE (ANTIEMETIC) 5 MG/2ML IV SOLN: 10.0000 mg | Freq: Once | INTRAVENOUS | Status: DC | PRN

## 2024-06-01 MED ORDER — PHENYLEPHRINE 80 MCG/ML (10ML) SYRINGE FOR IV PUSH (FOR BLOOD PRESSURE SUPPORT)
PREFILLED_SYRINGE | INTRAVENOUS | Status: AC
  Filled 2024-06-01: qty 10

## 2024-06-01 MED ORDER — ROPIVACAINE HCL 5 MG/ML IJ SOLN
INTRAMUSCULAR | Status: DC | PRN
  Administered 2024-06-01: 10 mL via PERINEURAL
  Administered 2024-06-01: 15 mL via PERINEURAL

## 2024-06-01 MED ORDER — SCOPOLAMINE 1 MG/3DAYS TD PT72
MEDICATED_PATCH | TRANSDERMAL | Status: AC
  Filled 2024-06-01: qty 1

## 2024-06-01 MED ORDER — LIDOCAINE 2% (20 MG/ML) 5 ML SYRINGE
INTRAMUSCULAR | Status: AC
  Filled 2024-06-01: qty 5

## 2024-06-01 MED ORDER — PROPOFOL 10 MG/ML IV BOLUS
INTRAVENOUS | Status: AC
  Filled 2024-06-01: qty 20

## 2024-06-01 MED ORDER — PHENYLEPHRINE HCL (PRESSORS) 10 MG/ML IV SOLN
INTRAVENOUS | Status: DC | PRN
  Administered 2024-06-01: 160 ug via INTRAVENOUS
  Administered 2024-06-01 (×5): 80 ug via INTRAVENOUS

## 2024-06-01 MED ORDER — ACETAMINOPHEN 500 MG PO TABS
1000.0000 mg | ORAL_TABLET | Freq: Once | ORAL | Status: AC
  Administered 2024-06-01: 1000 mg via ORAL

## 2024-06-01 MED ORDER — ONDANSETRON HCL 4 MG/2ML IJ SOLN: 4.0000 mg | Freq: Once | INTRAMUSCULAR | Status: DC | PRN

## 2024-06-01 MED ORDER — SUGAMMADEX SODIUM 200 MG/2ML IV SOLN
INTRAVENOUS | Status: DC | PRN
  Administered 2024-06-01: 200 mg via INTRAVENOUS

## 2024-06-01 MED ORDER — ONDANSETRON HCL 4 MG PO TABS: 4.0000 mg | ORAL_TABLET | Freq: Three times a day (TID) | ORAL | 0 refills | Status: AC | PRN

## 2024-06-01 MED ORDER — LACTATED RINGERS IV SOLN: INTRAVENOUS | Status: DC

## 2024-06-01 MED ORDER — OXYCODONE HCL 5 MG PO TABS: 5.0000 mg | ORAL_TABLET | Freq: Once | ORAL | Status: DC | PRN

## 2024-06-01 MED ORDER — GABAPENTIN 300 MG PO CAPS
300.0000 mg | ORAL_CAPSULE | Freq: Once | ORAL | Status: AC
  Administered 2024-06-01: 300 mg via ORAL

## 2024-06-01 MED ORDER — GABAPENTIN 300 MG PO CAPS
ORAL_CAPSULE | ORAL | Status: AC
  Filled 2024-06-01: qty 1

## 2024-06-01 MED ORDER — ACETAMINOPHEN 500 MG PO TABS: 1000.0000 mg | ORAL_TABLET | Freq: Once | ORAL | Status: AC

## 2024-06-01 SURGICAL SUPPLY — 45 items
ANCHOR SUT 1.8 FIBERTAK SB KL (Anchor) IMPLANT
BLADE EXCALIBUR 4.0X13 (MISCELLANEOUS) ×2 IMPLANT
BUR SURG 4D 13L RD FLUTE (BUR) IMPLANT
CANNULA 5.75X71 LONG (CANNULA) ×2 IMPLANT
CANNULA PASSPORT BUTTON 10-40 (CANNULA) ×2 IMPLANT
CANNULA TWIST IN 8.25X7CM (CANNULA) IMPLANT
CHLORAPREP W/TINT 26 (MISCELLANEOUS) ×2 IMPLANT
CLSR STERI-STRIP ANTIMIC 1/2X4 (GAUZE/BANDAGES/DRESSINGS) ×2 IMPLANT
COOLER ICEMAN CLASSIC (MISCELLANEOUS) ×2 IMPLANT
DRAPE IMP U-DRAPE 54X76 (DRAPES) ×2 IMPLANT
DRAPE INCISE IOBAN 66X45 STRL (DRAPES) IMPLANT
DRAPE SHOULDER BEACH CHAIR (DRAPES) ×2 IMPLANT
DW OUTFLOW CASSETTE/TUBE SET (MISCELLANEOUS) ×2 IMPLANT
GAUZE PAD ABD 8X10 STRL (GAUZE/BANDAGES/DRESSINGS) ×2 IMPLANT
GAUZE SPONGE 4X4 12PLY STRL (GAUZE/BANDAGES/DRESSINGS) ×2 IMPLANT
GLOVE BIO SURGEON STRL SZ 6.5 (GLOVE) ×2 IMPLANT
GLOVE BIOGEL PI IND STRL 6.5 (GLOVE) ×2 IMPLANT
GLOVE BIOGEL PI IND STRL 8 (GLOVE) ×2 IMPLANT
GLOVE ECLIPSE 8.0 STRL XLNG CF (GLOVE) ×2 IMPLANT
GOWN STRL REUS W/ TWL LRG LVL3 (GOWN DISPOSABLE) ×4 IMPLANT
GOWN STRL REUS W/TWL XL LVL3 (GOWN DISPOSABLE) ×2 IMPLANT
KIT PERC INSERT 3.0 KNTLS (KITS) IMPLANT
KIT STABILIZATION SHOULDER (MISCELLANEOUS) IMPLANT
KIT STR SPEAR 1.8 FBRTK DISP (KITS) IMPLANT
LASSO 90 CVE QUICKPAS (DISPOSABLE) IMPLANT
LASSO CRESCENT QUICKPASS (SUTURE) IMPLANT
MANIFOLD NEPTUNE II (INSTRUMENTS) ×2 IMPLANT
NDL SAFETY ECLIPSE 18X1.5 (NEEDLE) ×2 IMPLANT
PACK ARTHROSCOPY DSU (CUSTOM PROCEDURE TRAY) ×2 IMPLANT
PACK BASIN DAY SURGERY FS (CUSTOM PROCEDURE TRAY) ×2 IMPLANT
PAD COLD SHLDR WRAP-ON (PAD) ×2 IMPLANT
SHEET MEDIUM DRAPE 40X70 STRL (DRAPES) ×2 IMPLANT
SLEEVE ARM SUSPENSION SYSTEM (MISCELLANEOUS) ×2 IMPLANT
SLEEVE SCD COMPRESS KNEE MED (STOCKING) ×2 IMPLANT
SLING S3 LATERAL DISP (MISCELLANEOUS) ×2 IMPLANT
SLING SHLDR PAD UNIV <17 (SOFTGOODS) IMPLANT
SPIKE FLUID TRANSFER (MISCELLANEOUS) IMPLANT
SUT MNCRL AB 4-0 PS2 18 (SUTURE) ×2 IMPLANT
SUTURE FIBERWR #2 38 T-5 BLUE (SUTURE) IMPLANT
SUTURE TAPE TIGERLINK 1.3MM BL (SUTURE) IMPLANT
SYR 5ML LL (SYRINGE) ×2 IMPLANT
TOWEL GREEN STERILE FF (TOWEL DISPOSABLE) ×2 IMPLANT
TUBE CONNECTING 20X1/4 (TUBING) IMPLANT
TUBING ARTHROSCOPY IRRIG 16FT (MISCELLANEOUS) ×2 IMPLANT
WAND ABLATOR APOLLO I90 (BUR) IMPLANT

## 2024-06-01 NOTE — Anesthesia Procedure Notes (Signed)
 Procedure Name: Intubation Date/Time: 06/01/2024 7:54 AM  Performed by: Debarah Chiquita LABOR, CRNAPre-anesthesia Checklist: Patient identified, Emergency Drugs available, Suction available and Patient being monitored Patient Re-evaluated:Patient Re-evaluated prior to induction Oxygen Delivery Method: Circle system utilized Preoxygenation: Pre-oxygenation with 100% oxygen Induction Type: IV induction Ventilation: Mask ventilation without difficulty Laryngoscope Size: Mac and 3 Grade View: Grade I Tube type: Oral Tube size: 7.0 mm Number of attempts: 1 Airway Equipment and Method: Stylet and Bite block Placement Confirmation: ETT inserted through vocal cords under direct vision, positive ETCO2 and breath sounds checked- equal and bilateral Secured at: 25 cm Tube secured with: Tape Dental Injury: Teeth and Oropharynx as per pre-operative assessment

## 2024-06-01 NOTE — Interval H&P Note (Signed)
 All questions answered, patient wants to proceed with procedure. ? ?

## 2024-06-01 NOTE — Anesthesia Procedure Notes (Signed)
 Anesthesia Regional Block: Interscalene brachial plexus block   Pre-Anesthetic Checklist: , timeout performed,  Correct Patient, Correct Site, Correct Laterality,  Correct Procedure, Correct Position, site marked,  Risks and benefits discussed,  Surgical consent,  Pre-op evaluation,  At surgeon's request and post-op pain management  Laterality: Right  Prep: Maximum Sterile Barrier Precautions used, chloraprep       Needles:  Injection technique: Single-shot  Needle Type: Echogenic Stimulator Needle     Needle Length: 9cm  Needle Gauge: 22     Additional Needles:   Procedures:,,,, ultrasound used (permanent image in chart),,    Narrative:  Start time: 06/01/2024 9:35 AM End time: 06/01/2024 9:39 AM Injection made incrementally with aspirations every 5 mL.  Performed by: Personally  Anesthesiologist: Merla Almarie HERO, DO  Additional Notes: Monitors applied. No increased pain on injection. No increased resistance to injection. Injection made in 5cc increments. Good needle visualization. Patient tolerated procedure well.   Rescue block pacu for pain on lateral aspect of arm

## 2024-06-01 NOTE — Op Note (Signed)
 Orthopaedic Surgery Operative Note (CSN: [id number])  Mallorey L Corning  Feb 21, 1996 Date of Surgery: 06/01/2024   DIAGNOSES: Right shoulder, SLAP tear, biceps tendinitis, AC arthritis, and shoulder instability.  POST-OPERATIVE DIAGNOSIS: same  PROCEDURE: Arthroscopic extensive debridement - 29823 Subdeltoid Bursa, Superior Labrum, Posterior Labrum, and glenoid bone, glenoid cartilage, humeral bone and humeral cartilage Arthroscopic distal clavicle excision - 70175 Arthroscopic subacromial decompression - 70173 Arthroscopic biceps tenodesis - 70171 Arthroscopic anterior labral repair and capsulorrhaphy - 70193   OPERATIVE FINDING: Exam under anesthesia: Anterior instability with a small click when directed anteriorly. Articular space: Superior labral fraying and a SLAP tear. Chondral surfaces: Normal Biceps: Intrasubstance tearing of the biceps with scarring to the superior cuff.  There is a small SLAP tear type I however based on the appearance of the tendon as well as the patient's clinical exam I felt it was appropriate to consider a tenodesis. Subscapularis: Intact  Supraspinatus: Intact  Infraspinatus: Intact    Exam under anesthesia demonstrated a small click with anterior instability maneuvers.  She had a patulous capsule and a small anterior inferior labral tear.  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  There is significant redness in the subacromial space.  Biceps tenodesis was routine.  Post-operative plan: The patient will be non-weightbearing in a sling for 4 weeks following a anterior instability protocol however we will start therapy early and discontinue her sling early to try and improve mobility..  The patient will be discharged home.  DVT prophylaxis not indicated in ambulatory upper extremity patient without known risk factors.   Pain control with PRN pain medication preferring oral medicines.  Follow up plan will be  scheduled in approximately 7 days for incision check and XR.  Surgeons:Primary: Cristy Bonner DASEN, MD Assistants:Caroline McBane, PA-C Location: [facility] OR ROOM 6 Anesthesia: General with Exparel  interscalene block Antibiotics: Ancef  2 g Tourniquet time: None Estimated Blood Loss: Minimal Complications: None Specimens: None Implants: Implant Name Type Inv. Item Serial No. Manufacturer Lot No. LRB No. Used Action  ANCHOR SUT 1.8 FIBERTAK SB KL - [id number] Anchor ANCHOR SUT 1.8 FIBERTAK SB KL  [employer] [id number] Right 1 Implanted  ANCHOR SUT 1.8 FIBERTAK SB KL - [id number] Anchor ANCHOR SUT 1.8 FIBERTAK SB KL  [employer] [id number] Right 1 Implanted  ANCHOR SUT 1.8 FIBERTAK SB KL - [id number] Anchor ANCHOR SUT 1.8 FIBERTAK SB KL  [employer] [id number] Right 1 Implanted  ANCHOR SUT 1.8 FIBERTAK SB KL - [id number] Anchor ANCHOR SUT 1.8 FIBERTAK SB KL  [employer] [id number] Right 1 Implanted    Indications for Surgery:   Emnet Monk Puerto is a 28 y.o. female with continued shoulder pain refractory to nonoperative measures for extended period of time.    The risks and benefits were explained at length including but not limited to continued pain, cuff failure, biceps tenodesis failure, stiffness, need for further surgery and infection.   Procedure:   Patient was correctly identified in the preoperative holding area and operative site marked.  Patient brought to OR and positioned beachchair on an [place] table ensuring that all bony prominences were padded and the head was in an appropriate location.  Anesthesia was induced and the operative shoulder was prepped and draped in the usual sterile fashion.  Timeout was called preincision.  A standard posterior viewing portal was made after localizing the portal with a spinal needle.  An anterior accessory portal was also made.  After clearing the  articular space the camera was positioned in the subacromial space.  Findings above.    Extensive debridement was  performed of the anterior interval tissue, labral fraying and the bursa.  Glenoid bone, glenoid cartilage, humeral bone were all debrided.  Subacromial decompression: We made a lateral portal with spinal needle guidance. We then proceeded to debride bursal tissue extensively with a shaver and arthrocare device. At that point we continued to identify the borders of the acromion and identify the spur. We then carefully preserved the deltoid fascia and used a burr to convert the acromion to a Type 1 flat acromion without issue.  Biceps tenodesis: We marked the tendon and then performed a tenotomy and debridement of the stump in the articular space. We then identified the biceps tendon in its groove suprapec with the arthroscope in the lateral portal taking care to move from lateral to medial to avoid injury to the subscapularis. At that point we unroofed the tendon itself and mobilized it. An accessory anterior portal was made in line with the tendon and we grasped it from the anterior superior portal and worked from the accessory anterior portal. Two Fibertak 1.75mm knotless anchors were placed in the groove and the tendon was secured in a luggage loop style fashion with a pass of the limb of suture through the tendon using a scorpion device to avoid pull-through.  Repair was completed with good tension on the tendon.  Residual stump of the tendon was removed after being resected with a RF ablator.  Distal Clavicle resection:  The scope was placed in the subacromial space from the posterior portal.  A hemostat was placed through the anterior portal and we spread at the [facility] joint.  A burr was then inserted and 10 mm of distal clavicle was resected taking care to avoid damage to the capsule around the joint and avoiding overhanging bone posteriorly.    We identified the anterior-inferior labral tear.  We stimulated the tissue with a rasp and elevator to mobilize the capsule.  We then were able to place two 1.8  fiber tack anchors at the 6:00 and 4 o'clock position to imbricate the tissue and perform an anterior imbrication of the capsule.  Head was centered at the end of the case.  The incisions were closed with absorbable monocryl and steri strips.  A sterile dressing was placed along with a sling. The patient was awoken from general anesthesia and taken to the PACU in stable condition without complication.   Aleck Stalling, PA-C, present and scrubbed throughout the case, critical for completion in a timely fashion, and for retraction, instrumentation, closure.

## 2024-06-01 NOTE — Anesthesia Postprocedure Evaluation (Signed)
 Anesthesia Post Note  Patient: Donna Montgomery  Procedure(s) Performed: ARTHROSCOPY, SHOULDER WITH DEBRIDEMENT (Right: Shoulder)     Patient location during evaluation: Phase II Anesthesia Type: Regional and General Level of consciousness: awake and alert, oriented and patient cooperative Pain management: pain level controlled Vital Signs Assessment: post-procedure vital signs reviewed and stable Respiratory status: spontaneous breathing, nonlabored ventilation and respiratory function stable Cardiovascular status: blood pressure returned to baseline and stable Postop Assessment: no apparent nausea or vomiting Anesthetic complications: no Comments: Rescue blocked in PACU for lateral arm pain, now in 0/10  pain in phase 2   No notable events documented.  Last Vitals:  Vitals:   06/01/24 0954 06/01/24 1018  BP:  (!) 120/90  Pulse: 85 84  Resp: 14 15  Temp:  (!) 36.3 C  SpO2: 96% 94%    Last Pain:  Vitals:   06/01/24 1018  PainSc: 0-No pain                 [name]

## 2024-06-01 NOTE — Progress Notes (Signed)
Assisted Dr. Finucane with right, interscalene , ultrasound guided block. Side rails up, monitors on throughout procedure. See vital signs in flow sheet. Tolerated Procedure well. ?

## 2024-06-01 NOTE — Anesthesia Procedure Notes (Signed)
 Anesthesia Regional Block: Interscalene brachial plexus block   Pre-Anesthetic Checklist: , timeout performed,  Correct Patient, Correct Site, Correct Laterality,  Correct Procedure, Correct Position, site marked,  Risks and benefits discussed,  Surgical consent,  Pre-op evaluation,  At surgeon's request and post-op pain management  Laterality: Right  Prep: Maximum Sterile Barrier Precautions used, chloraprep       Needles:  Injection technique: Single-shot  Needle Type: Echogenic Stimulator Needle     Needle Length: 9cm  Needle Gauge: 22     Additional Needles:   Procedures:,,,, ultrasound used (permanent image in chart),,    Narrative:  Start time: 06/01/2024 7:05 AM End time: 06/01/2024 7:10 AM Injection made incrementally with aspirations every 5 mL.  Performed by: Personally  Anesthesiologist: Merla Almarie HERO, DO  Additional Notes: Monitors applied. No increased pain on injection. No increased resistance to injection. Injection made in 5cc increments. Good needle visualization. Patient tolerated procedure well.

## 2024-06-01 NOTE — Transfer of Care (Signed)
 Immediate Anesthesia Transfer of Care Note  Patient: Donna Montgomery  Procedure(s) Performed: ARTHROSCOPY, SHOULDER WITH DEBRIDEMENT (Right: Shoulder)  Patient Location: PACU  Anesthesia Type:GA combined with regional for post-op pain  Level of Consciousness: awake, alert , and oriented  Airway & Oxygen Therapy: Patient Spontanous Breathing and Patient connected to face mask oxygen  Post-op Assessment: Report given to RN and Post -op Vital signs reviewed and stable  Post vital signs: Reviewed and stable  Last Vitals:  Vitals Value Taken Time  BP 140/77 06/01/24 09:09  Temp    Pulse 105 06/01/24 09:10  Resp 18 06/01/24 09:10  SpO2 99 % 06/01/24 09:10  Vitals shown include unfiled device data.  Last Pain:  Vitals:   06/01/24 0655  PainSc: 0-No pain         Complications: No notable events documented.

## 2024-06-02 ENCOUNTER — Encounter (HOSPITAL_BASED_OUTPATIENT_CLINIC_OR_DEPARTMENT_OTHER): Payer: Self-pay | Admitting: Orthopaedic Surgery

## 2024-06-05 NOTE — Therapy (Signed)
 OUTPATIENT PHYSICAL THERAPY UPPER EXTREMITY EVALUATION   Patient Name: Donna Montgomery MRN: [id number] DOB:September 06, 1996, 28 y.o., female Today's Date: 06/06/2024  END OF SESSION:  PT End of Session - 06/06/24 1150     Visit Number 1    Number of Visits 24    Date for Recertification  08/29/24    Authorization Type [employer] blue requesting auth    PT Start Time 1146    PT Stop Time 1230    PT Time Calculation (min) 44 min    Activity Tolerance Patient limited by pain    Behavior During Therapy [facility] for tasks assessed/performed          Past Medical History:  Diagnosis Date   Allergy    Anemia 2014   My blood tests in college said i had low iron and I was given medication. It has been low since.   Anxiety 2014   Had anxiety my whole life, but recognized in college by health professional.   Asthma    Depression 2014   Noticed symptoms in college, was put onto two different medicines. Stopped taking medication and seeked therapy. Symptoms come and go.   Heart murmur 05/08/2019   Ulcer 2013   Bleeding ulcer from H. Bacteria.   Past Surgical History:  Procedure Laterality Date   FRACTURE SURGERY  2017   Hand fracture   OPEN REDUCTION INTERNAL FIXATION (ORIF) HAND Left 2017   POSTERIOR LUMBAR FUSION 2 WITH HARDWARE REMOVAL Right 06/01/2024   Procedure: ARTHROSCOPY, SHOULDER WITH DEBRIDEMENT;  Surgeon: Cristy Bonner DASEN, MD;  Location: [facility];  Service: Orthopedics;  Laterality: Right;  arthroscopy, shoulder with extensive debridement, Bankart repair, decompression, distal clavicle excision, biceps tenodesis   Patient Active Problem List   Diagnosis Date Noted   Degeneration of lateral meniscus, left 02/25/2024   GAD (generalized anxiety disorder) 08/05/2023   Current moderate episode of major depressive disorder without prior episode (HCC) 08/05/2023   Chronic right shoulder pain 08/05/2023   Acute otitis externa of left ear 08/05/2023   Tinnitus of left ear  08/05/2023   Menorrhagia with regular cycle 10/24/2022   Iron deficiency anemia 10/24/2022   Depression, recurrent 03/25/2020   Anxiety 03/25/2020   Chronic toe pain, right foot 07/27/2017   Asthma 08/29/2010   EXERCISE INDUCED ASTHMA 07/11/2010    PCP: Bevin Bernice RAMAN, DO  REFERRING PROVIDER: Jennye Aleck SAILOR, PA-C  REFERRING DIAG: right shoulder arthroscopy with subacromial decompression, distal clavicle excision, biceps tenodesis, bankart repair   THERAPY DIAG:  Acute pain of right shoulder  Muscle weakness (generalized)  Localized edema  Rationale for Evaluation and Treatment: Rehabilitation  ONSET DATE: 06/01/24  SUBJECTIVE:  SUBJECTIVE STATEMENT: Patient underwent Rt shoulder surgery on 9/18. She has f/u with Dr. Cristy on 9/26. She has used all the pain meds and is taking tylenol  as needed for pain. Patient reports the shoulder remains painful described as stabbing and burning. Patient reports sleep is uncomfortable, but remains in her sling at all times except for showering. 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  Hand dominance: Right  PERTINENT HISTORY: None   PAIN:  Are you having pain? Yes: NPRS scale: 4 currently; at worst 11 Pain location: Rt shoulder Pain description: stabbing, burning,ache Aggravating factors: movement,sleep Relieving factors: ice, medicine  PRECAUTIONS: Other: see post-op protocol for current restrictions  RED FLAGS: None   WEIGHT BEARING RESTRICTIONS: Yes NWB RUE  FALLS:  Has patient fallen in last 6 months? No  LIVING ENVIRONMENT: Lives with: lives with their family Lives in: House/apartment Stairs: Yes: Internal: 15 steps; on right going up Has following equipment at home: None  OCCUPATION: Professional  basketball player   PLOF: Independent  PATIENT GOALS: To move my arm above 90. To be able to lift like before. Shoot without pain   NEXT MD VISIT: 06/09/24  OBJECTIVE:  Note: Objective measures were completed at Evaluation unless otherwise noted.  DIAGNOSTIC FINDINGS:  None on file   PATIENT SURVEYS :  Quick DASH: 88.6% disability   COGNITION: Overall cognitive status: Within functional limits for tasks assessed     SENSATION: Not tested  POSTURE: Rounded shoulders   UPPER EXTREMITY ROM:   Passive ROM Right eval Left eval  Shoulder flexion 55   Shoulder extension    Shoulder abduction 35   Shoulder adduction    Shoulder internal rotation    Shoulder external rotation 0   Elbow flexion    Elbow extension    Wrist flexion    Wrist extension    Wrist ulnar deviation    Wrist radial deviation    Wrist pronation    Wrist supination    (Blank rows = not tested)  UPPER EXTREMITY MMT: MMT deferred secondary to post-op acuity, current restrictions  MMT Right eval Left eval  Shoulder flexion    Shoulder extension    Shoulder abduction    Shoulder adduction    Shoulder internal rotation    Shoulder external rotation    Middle trapezius    Lower trapezius    Elbow flexion    Elbow extension    Wrist flexion    Wrist extension    Wrist ulnar deviation    Wrist radial deviation    Wrist pronation    Wrist supination    Grip strength (lbs)    (Blank rows = not tested)                                                                                                                                [facility] Adult PT Treatment:  DATE: 06/05/24 Therapeutic Exercise: Demonstrated,performed and issued initial HEP.    Modalities: Ice pack to Rt shoulder x 10 minutes Self Care: Discussed post-operative protocol, current restrictions     PATIENT EDUCATION: Education details: see treatment; POC  Person educated:  Patient Education method: Explanation, Demonstration, Tactile cues, Verbal cues, and Handouts Education comprehension: verbalized understanding, returned demonstration, verbal cues required, tactile cues required, and needs further education  HOME EXERCISE PROGRAM: Access Code: [email or web address] URL: [email or web address] Date: 06/06/2024 Prepared by: Lucie Meeter  Exercises - Wrist Flexion Extension AROM - Palms Down  - 2 x daily - 7 x weekly - 2 sets - 10 reps - Putty Squeezes  - 2 x daily - 7 x weekly - 2 sets - 10 reps  ASSESSMENT:  CLINICAL IMPRESSION: Patient is a 28 y.o. Rt-handed female who was seen today for physical therapy evaluation and treatment for s/p right shoulder arthroscopy with subacromial decompression, distal clavicle excision, biceps tenodesis, and bankart repair on 06/01/24. She demonstrates RUE ROM and strength deficits that are consistent with recent post-operative status. Patient became nauseous and dizzy following Rt shoulder PROM assessment that was relieved following inclined sitting with ice to the Rt shoulder. She will benefit from skilled PT to address the above stated deficits in order to restore functional use of the RUE and return to professional basketball.     OBJECTIVE IMPAIRMENTS: decreased activity tolerance, decreased endurance, decreased knowledge of condition, decreased ROM, decreased strength, increased edema, increased fascial restrictions, impaired UE functional use, improper body mechanics, postural dysfunction, and pain.   ACTIVITY LIMITATIONS: carrying, lifting, sleeping, bathing, dressing, self feeding, reach over head, and hygiene/grooming  PARTICIPATION LIMITATIONS: meal prep, cleaning, laundry, driving, shopping, community activity, occupation, and yard work  PERSONAL FACTORS: Age, Profession, Time since onset of injury/illness/exacerbation, and 3+ comorbidities: see PMH above are also affecting patient's functional outcome.    REHAB POTENTIAL: Excellent  CLINICAL DECISION MAKING: Stable/uncomplicated  EVALUATION COMPLEXITY: Low  GOALS: Goals reviewed with patient? Yes  SHORT TERM GOALS: Target date: 07/18/2024    Patient will be independent and compliant with initial HEP.   Baseline: initial HEP issued  Goal status: INITIAL  2.  Patient will demonstrate at least 90 degrees of Rt shoulder flexion AROM to improve ability to complete reaching activity.  Baseline: see PROM above  Goal status: INITIAL  3.  Patient will demonstrate at least 20 degrees of Rt shoulder ER AROM to improve ability to complete self-care activities.  Baseline: see PROM above  Goal status: INITIAL  4. Patient will report pain at worst rated as </=5/10 to improve sleep quality.   Baseline: 11  Goal status: INITIAL   LONG TERM GOALS: Target date: 08/29/2024    Patient will score </= 20 % disability on the QuickDASH (MCID is 8-15.9) to signify clinically meaningful improvement in functional abilities.   Baseline: see above Goal status: INITIAL  2.  Patient will demonstrate at least 4+/5 Rt shoulder strength in order to shoot a basketball.  Baseline: strength testing deferred Goal status: INITIAL  3.  Patient will demonstrate functional and pain free Rt shoulder AROM necessary to rebound and pass basketball.  Baseline: see PROM above  Goal status: INITIAL  4.  Patient will be independent with advanced home program to progress/maintain current level of function  Baseline: initial HEP issued  Goal status: INITIAL  PLAN: PT FREQUENCY: 2x/week  PT DURATION: 12 weeks  PLANNED INTERVENTIONS: 97164- PT Re-evaluation, 97750- Physical Performance Testing, 97110-Therapeutic exercises, 97530- Therapeutic activity, 97112-  Neuromuscular re-education, [id number]- Self Care, 02859- Manual therapy, [id number]- Aquatic Therapy, [phone]- Electrical stimulation (unattended), [id number]- Electrical stimulation (manual), [id number]- Vasopneumatic device,  20560 (1-2 muscles), 20561 (3+ muscles)- Dry Needling, Patient/Family education, Cryotherapy, and Moist heat  PLAN FOR NEXT SESSION: progress per protocol on file. Gameready not covered.    Ehren Berisha, PT, DPT, ATC 06/06/24 2:08 PM  For all possible CPT codes, reference the Planned Interventions line above.     Check all conditions that are expected to impact treatment: {Conditions expected to impact treatment:Psychological or psychiatric disorders   If treatment provided at initial evaluation, no treatment charged due to lack of authorization.

## 2024-06-06 ENCOUNTER — Ambulatory Visit: Attending: Sports Medicine

## 2024-06-06 ENCOUNTER — Other Ambulatory Visit: Payer: Self-pay

## 2024-06-06 DIAGNOSIS — M6281 Muscle weakness (generalized): Secondary | ICD-10-CM | POA: Diagnosis present

## 2024-06-06 DIAGNOSIS — R600 Localized edema: Secondary | ICD-10-CM | POA: Insufficient documentation

## 2024-06-06 DIAGNOSIS — M25511 Pain in right shoulder: Secondary | ICD-10-CM | POA: Insufficient documentation

## 2024-06-08 ENCOUNTER — Ambulatory Visit: Payer: Self-pay

## 2024-06-08 DIAGNOSIS — M25511 Pain in right shoulder: Secondary | ICD-10-CM | POA: Diagnosis not present

## 2024-06-08 DIAGNOSIS — R600 Localized edema: Secondary | ICD-10-CM

## 2024-06-08 DIAGNOSIS — M6281 Muscle weakness (generalized): Secondary | ICD-10-CM

## 2024-06-08 NOTE — Therapy (Signed)
 OUTPATIENT PHYSICAL THERAPY UPPER EXTREMITY TREATMENT   Patient Name: Donna Montgomery MRN: [id number] DOB:Jan 14, 1996, 28 y.o., female Today's Date: 06/08/2024  END OF SESSION:  PT End of Session - 06/08/24 1614     Visit Number 2    Number of Visits 24    Date for Recertification  08/29/24    Authorization Type M[employer]he[employer]    Authorization Time Period 9/24-12/23    Authorization - Visit Number 1    Authorization - Number of Visits 13    PT Start Time 1615    PT Stop Time 1657    PT Time Calculation (min) 42 min    Activity Tolerance Patient tolerated treatment well    Behavior During Therapy WFL for tasks assessed/performed           Past Medical History:  Diagnosis Date   Allergy    Anemia 2014   My blood tests in college said i had low iron and I was given medication. It has been low since.   Anxiety 2014   Had anxiety my whole life, but recognized in college by health professional.   Asthma    Depression 2014   Noticed symptoms in college, was put onto two different medicines. Stopped taking medication and seeked therapy. Symptoms come and go.   Heart murmur 05/08/2019   Ulcer 2013   Bleeding ulcer from H. Bacteria.   Past Surgical History:  Procedure Laterality Date   FRACTURE SURGERY  2017   Hand fracture   OPEN REDUCTION INTERNAL FIXATION (ORIF) HAND Left 2017   POSTERIOR LUMBAR FUSION 2 WITH HARDWARE REMOVAL Right 06/01/2024   Procedure: ARTHROSCOPY, SHOULDER WITH DEBRIDEMENT;  Surgeon: Cristy Bonner DASEN, MD;  Location: [facility];  Service: Orthopedics;  Laterality: Right;  arthroscopy, shoulder with extensive debridement, Bankart repair, decompression, distal clavicle excision, biceps tenodesis   Patient Active Problem List   Diagnosis Date Noted   Degeneration of lateral meniscus, left 02/25/2024   GAD (generalized anxiety disorder) 08/05/2023   Current moderate episode of major depressive disorder without prior episode (HCC) 08/05/2023    Chronic right shoulder pain 08/05/2023   Acute otitis externa of left ear 08/05/2023   Tinnitus of left ear 08/05/2023   Menorrhagia with regular cycle 10/24/2022   Iron deficiency anemia 10/24/2022   Depression, recurrent 03/25/2020   Anxiety 03/25/2020   Chronic toe pain, right foot 07/27/2017   Asthma 08/29/2010   EXERCISE INDUCED ASTHMA 07/11/2010    PCP: Bevin Bernice RAMAN, DO  REFERRING PROVIDER: Jennye Aleck SAILOR, PA-C  REFERRING DIAG: right shoulder arthroscopy with subacromial decompression, distal clavicle excision, biceps tenodesis, bankart repair   THERAPY DIAG:  Acute pain of right shoulder  Muscle weakness (generalized)  Localized edema  Rationale for Evaluation and Treatment: Rehabilitation  ONSET DATE: 06/01/24  SUBJECTIVE:  SUBJECTIVE STATEMENT: Patient reports the shoulder feels better compared to the other day. She is taking tylenol  for pain. Has f/u with Dr. Cristy tomorrow.    EVAL: Patient underwent Rt shoulder surgery on 9/18. She has f/u with Dr. Cristy on 9/26. She has used all the pain meds and is taking tylenol  as needed for pain. Patient reports the shoulder remains painful described as stabbing and burning. Patient reports sleep is uncomfortable, but remains in her sling at all times except for showering. 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  Hand dominance: Right  PERTINENT HISTORY: None   PAIN:  Are you having pain? Yes: NPRS scale: 3 Pain location: Rt shoulder/upper arm Pain description:  burning,ache Aggravating factors: movement,sleep Relieving factors: ice, medicine  PRECAUTIONS: Other: see post-op protocol for current restrictions  RED FLAGS: None   WEIGHT BEARING RESTRICTIONS: Yes NWB RUE  FALLS:  Has patient fallen  in last 6 months? No  LIVING ENVIRONMENT: Lives with: lives with their family Lives in: House/apartment Stairs: Yes: Internal: 15 steps; on right going up Has following equipment at home: None  OCCUPATION: Professional basketball player   PLOF: Independent  PATIENT GOALS: To move my arm above 90. To be able to lift like before. Shoot without pain   NEXT MD VISIT: 06/09/24  OBJECTIVE:  Note: Objective measures were completed at Evaluation unless otherwise noted.  DIAGNOSTIC FINDINGS:  None on file   PATIENT SURVEYS :  Quick DASH: 88.6% disability   COGNITION: Overall cognitive status: Within functional limits for tasks assessed     SENSATION: Not tested  POSTURE: Rounded shoulders   UPPER EXTREMITY ROM:   Passive ROM Right eval Left eval  Shoulder flexion 55   Shoulder extension    Shoulder abduction 35   Shoulder adduction    Shoulder internal rotation    Shoulder external rotation 0   Elbow flexion    Elbow extension    Wrist flexion    Wrist extension    Wrist ulnar deviation    Wrist radial deviation    Wrist pronation    Wrist supination    (Blank rows = not tested)  UPPER EXTREMITY MMT: MMT deferred secondary to post-op acuity, current restrictions  MMT Right eval Left eval  Shoulder flexion    Shoulder extension    Shoulder abduction    Shoulder adduction    Shoulder internal rotation    Shoulder external rotation    Middle trapezius    Lower trapezius    Elbow flexion    Elbow extension    Wrist flexion    Wrist extension    Wrist ulnar deviation    Wrist radial deviation    Wrist pronation    Wrist supination    Grip strength (lbs)    (Blank rows = not tested)      [facility] Adult PT Treatment:                                                DATE: 06/08/24 Therapeutic Exercise: Supine elbow flexion/extension AROM x 10  Pendulums small range circles x 10  Reviewed and updated HEP  Manual Therapy: Rt shoulder PROM to tolerance  within post-op parameters Rt elbow/wrist PROM in all planes to tolerance   Modalities: VASO- game ready Rt shoulder low compression 34 degrees x  10 mintues                                                                                                                          [facility] Adult PT Treatment:                                                DATE: 06/05/24 Therapeutic Exercise: Demonstrated,performed and issued initial HEP.    Modalities: Ice pack to Rt shoulder x 10 minutes Self Care: Discussed post-operative protocol, current restrictions     PATIENT EDUCATION: Education details: see treatment Person educated: Patient Education method: Explanation, Demonstration, Tactile cues, Verbal cues, and Handouts Education comprehension: verbalized understanding, returned demonstration, verbal cues required, tactile cues required, and needs further education  HOME EXERCISE PROGRAM: Access Code: [email or web address] URL: [email or web address] Date: 06/08/2024 Prepared by: Lucie Meeter  Exercises - Wrist Flexion Extension AROM - Palms Down  - 2 x daily - 7 x weekly - 2 sets - 10 reps - Putty Squeezes  - 2 x daily - 7 x weekly - 2 sets - 10 reps - Supine Elbow Flexion Extension AROM  - 2 x daily - 7 x weekly - 1 sets - 10 reps - Circular Shoulder Pendulum with Table Support  - 2 x daily - 7 x weekly - 1 sets - 10 reps  ASSESSMENT:  CLINICAL IMPRESSION: Patient is 1 week post-op tolerating gentle PROM within post-operative restrictions well. She achieves targeted protocol abduction PROM, but flexion and ER remain limited. Able to complete elbow flexion/extension AROM without an increase in pain. Finished session with game ready for edema reduction and pain control.   EVAL: Patient is a 28 y.o. Rt-handed female who was seen today for physical therapy evaluation and treatment for s/p right shoulder arthroscopy with subacromial decompression, distal clavicle excision, biceps  tenodesis, and bankart repair on 06/01/24. She demonstrates RUE ROM and strength deficits that are consistent with recent post-operative status. Patient became nauseous and dizzy following Rt shoulder PROM assessment that was relieved following inclined sitting with ice to the Rt shoulder. She will benefit from skilled PT to address the above stated deficits in order to restore functional use of the RUE and return to professional basketball.     OBJECTIVE IMPAIRMENTS: decreased activity tolerance, decreased endurance, decreased knowledge of condition, decreased ROM, decreased strength, increased edema, increased fascial restrictions, impaired UE functional use, improper body mechanics, postural dysfunction, and pain.   ACTIVITY LIMITATIONS: carrying, lifting, sleeping, bathing, dressing, self feeding, reach over head, and hygiene/grooming  PARTICIPATION LIMITATIONS: meal prep, cleaning, laundry, driving, shopping, community activity, occupation, and yard work  PERSONAL FACTORS: Age, Profession, Time since onset of injury/illness/exacerbation, and 3+ comorbidities: see PMH above are also affecting patient's functional outcome.   REHAB POTENTIAL: Excellent  CLINICAL DECISION MAKING: Stable/uncomplicated  EVALUATION COMPLEXITY: Low  GOALS: Goals reviewed  with patient? Yes  SHORT TERM GOALS: Target date: 07/18/2024    Patient will be independent and compliant with initial HEP.   Baseline: initial HEP issued  Goal status: INITIAL  2.  Patient will demonstrate at least 90 degrees of Rt shoulder flexion AROM to improve ability to complete reaching activity.  Baseline: see PROM above  Goal status: INITIAL  3.  Patient will demonstrate at least 20 degrees of Rt shoulder ER AROM to improve ability to complete self-care activities.  Baseline: see PROM above  Goal status: INITIAL  4. Patient will report pain at worst rated as </=5/10 to improve sleep quality.   Baseline: 11  Goal status:  INITIAL   LONG TERM GOALS: Target date: 08/29/2024    Patient will score </= 20 % disability on the QuickDASH (MCID is 8-15.9) to signify clinically meaningful improvement in functional abilities.   Baseline: see above Goal status: INITIAL  2.  Patient will demonstrate at least 4+/5 Rt shoulder strength in order to shoot a basketball.  Baseline: strength testing deferred Goal status: INITIAL  3.  Patient will demonstrate functional and pain free Rt shoulder AROM necessary to rebound and pass basketball.  Baseline: see PROM above  Goal status: INITIAL  4.  Patient will be independent with advanced home program to progress/maintain current level of function  Baseline: initial HEP issued  Goal status: INITIAL  PLAN: PT FREQUENCY: 2x/week  PT DURATION: 12 weeks  PLANNED INTERVENTIONS: 97164- PT Re-evaluation, 97750- Physical Performance Testing, 97110-Therapeutic exercises, 97530- Therapeutic activity, [id number]- Neuromuscular re-education, 97535- Self Care, 02859- Manual therapy, [id number]- Aquatic Therapy, H9716- Electrical stimulation (unattended), [id number]- Electrical stimulation (manual), 97016- Vasopneumatic device, 20560 (1-2 muscles), 20561 (3+ muscles)- Dry Needling, Patient/Family education, Cryotherapy, and Moist heat  PLAN FOR NEXT SESSION: progress per protocol on file. Gameready not covered.    Randolf Sansoucie, PT, DPT, ATC 06/08/24 5:26 PM

## 2024-06-14 ENCOUNTER — Ambulatory Visit: Payer: Self-pay | Attending: Sports Medicine

## 2024-06-14 DIAGNOSIS — M25511 Pain in right shoulder: Secondary | ICD-10-CM | POA: Diagnosis not present

## 2024-06-14 DIAGNOSIS — R600 Localized edema: Secondary | ICD-10-CM | POA: Diagnosis not present

## 2024-06-14 DIAGNOSIS — M6281 Muscle weakness (generalized): Secondary | ICD-10-CM | POA: Insufficient documentation

## 2024-06-14 NOTE — Therapy (Signed)
 OUTPATIENT PHYSICAL THERAPY UPPER EXTREMITY TREATMENT   Patient Name: Donna Montgomery MRN: [id number] DOB:05/03/1996, 28 y.o., female Today's Date: 06/14/2024  END OF SESSION:  PT End of Session - 06/14/24 1404     Visit Number 3    Number of Visits 24    Date for Recertification  08/29/24    Authorization Type [employer]    Authorization Time Period 9/24-12/23    Authorization - Visit Number 2    Authorization - Number of Visits 13    PT Start Time 1403    PT Stop Time 1455    PT Time Calculation (min) 52 min    Activity Tolerance Patient tolerated treatment well    Behavior During Therapy WFL for tasks assessed/performed            Past Medical History:  Diagnosis Date   Allergy    Anemia 2014   My blood tests in college said i had low iron and I was given medication. It has been low since.   Anxiety 2014   Had anxiety my whole life, but recognized in college by health professional.   Asthma    Depression 2014   Noticed symptoms in college, was put onto two different medicines. Stopped taking medication and seeked therapy. Symptoms come and go.   Heart murmur 05/08/2019   Ulcer 2013   Bleeding ulcer from H. Bacteria.   Past Surgical History:  Procedure Laterality Date   FRACTURE SURGERY  2017   Hand fracture   OPEN REDUCTION INTERNAL FIXATION (ORIF) HAND Left 2017   POSTERIOR LUMBAR FUSION 2 WITH HARDWARE REMOVAL Right 06/01/2024   Procedure: ARTHROSCOPY, SHOULDER WITH DEBRIDEMENT;  Surgeon: Cristy Bonner DASEN, MD;  Location: [facility];  Service: Orthopedics;  Laterality: Right;  arthroscopy, shoulder with extensive debridement, Bankart repair, decompression, distal clavicle excision, biceps tenodesis   Patient Active Problem List   Diagnosis Date Noted   Degeneration of lateral meniscus, left 02/25/2024   GAD (generalized anxiety disorder) 08/05/2023   Current moderate episode of major depressive disorder without prior episode (HCC) 08/05/2023    Chronic right shoulder pain 08/05/2023   Acute otitis externa of left ear 08/05/2023   Tinnitus of left ear 08/05/2023   Menorrhagia with regular cycle 10/24/2022   Iron deficiency anemia 10/24/2022   Depression, recurrent 03/25/2020   Anxiety 03/25/2020   Chronic toe pain, right foot 07/27/2017   Asthma 08/29/2010   EXERCISE INDUCED ASTHMA 07/11/2010    PCP: Bevin Bernice RAMAN, DO  REFERRING PROVIDER: Jennye Aleck SAILOR, PA-C  REFERRING DIAG: right shoulder arthroscopy with subacromial decompression, distal clavicle excision, biceps tenodesis, bankart repair   THERAPY DIAG:  Acute pain of right shoulder  Muscle weakness (generalized)  Localized edema  Rationale for Evaluation and Treatment: Rehabilitation  ONSET DATE: 06/01/24  SUBJECTIVE:  SUBJECTIVE STATEMENT: Patient reports the shoulder feels good today. The pendulums no longer cause discomfort. Had f/u with Dr. Cristy, which went well. Next f/u is in 3 weeks.    EVAL: Patient underwent Rt shoulder surgery on 9/18. She has f/u with Dr. Cristy on 9/26. She has used all the pain meds and is taking tylenol  as needed for pain. Patient reports the shoulder remains painful described as stabbing and burning. Patient reports sleep is uncomfortable, but remains in her sling at all times except for showering. 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  Hand dominance: Right  PERTINENT HISTORY: None   PAIN:  Are you having pain? Yes: NPRS scale: 1 Pain location: Rt shoulder/upper arm Pain description:  burning,ache Aggravating factors: movement,sleep Relieving factors: ice, medicine  PRECAUTIONS: Other: see post-op protocol for current restrictions  RED FLAGS: None   WEIGHT BEARING RESTRICTIONS: Yes NWB RUE  FALLS:   Has patient fallen in last 6 months? No  LIVING ENVIRONMENT: Lives with: lives with their family Lives in: House/apartment Stairs: Yes: Internal: 15 steps; on right going up Has following equipment at home: None  OCCUPATION: Professional basketball player   PLOF: Independent  PATIENT GOALS: To move my arm above 90. To be able to lift like before. Shoot without pain   NEXT MD VISIT: October 2025   OBJECTIVE:  Note: Objective measures were completed at Evaluation unless otherwise noted.  DIAGNOSTIC FINDINGS:  None on file   PATIENT SURVEYS :  Quick DASH: 88.6% disability   COGNITION: Overall cognitive status: Within functional limits for tasks assessed     SENSATION: Not tested  POSTURE: Rounded shoulders   UPPER EXTREMITY ROM:   Passive ROM Right eval Left eval 06/14/24 Right PROM  Shoulder flexion 55    Shoulder extension     Shoulder abduction 35  45  Shoulder adduction     Shoulder internal rotation     Shoulder external rotation 0  10  Elbow flexion     Elbow extension     Wrist flexion     Wrist extension     Wrist ulnar deviation     Wrist radial deviation     Wrist pronation     Wrist supination     (Blank rows = not tested)  UPPER EXTREMITY MMT: MMT deferred secondary to post-op acuity, current restrictions  MMT Right eval Left eval  Shoulder flexion    Shoulder extension    Shoulder abduction    Shoulder adduction    Shoulder internal rotation    Shoulder external rotation    Middle trapezius    Lower trapezius    Elbow flexion    Elbow extension    Wrist flexion    Wrist extension    Wrist ulnar deviation    Wrist radial deviation    Wrist pronation    Wrist supination    Grip strength (lbs)    (Blank rows = not tested)   [facility] Adult PT Treatment:                                                DATE: 06/14/24 Therapeutic Exercise: Abduction, ER, and IR isometric in sling x 5 each  HEP update  Manual Therapy: Rt shoulder  PROM to tolerance within post-op parameters Rt elbow/wrist PROM in all planes  to tolerance   Modalities: VASO- game ready Rt shoulder medium compression 34 degrees x 10 mintues  Self Care: Protocol review Removed sling pillow and readjusted sling to proper fit.     [facility] Adult PT Treatment:                                                DATE: 06/08/24 Therapeutic Exercise: Supine elbow flexion/extension AROM x 10  Pendulums small range circles x 10  Reviewed and updated HEP  Manual Therapy: Rt shoulder PROM to tolerance within post-op parameters Rt elbow/wrist PROM in all planes to tolerance   Modalities: VASO- game ready Rt shoulder low compression 34 degrees x 10 mintues                                                                                                                          [facility] Adult PT Treatment:                                                DATE: 06/05/24 Therapeutic Exercise: Demonstrated,performed and issued initial HEP.    Modalities: Ice pack to Rt shoulder x 10 minutes Self Care: Discussed post-operative protocol, current restrictions     PATIENT EDUCATION: Education details: HEP update  Person educated: Patient Education method: Explanation, Demonstration, Tactile cues, Verbal cues, and Handouts Education comprehension: verbalized understanding, returned demonstration, verbal cues required, tactile cues required, and needs further education  HOME EXERCISE PROGRAM: Access Code: [email or web address] URL: [email or web address] Date: 06/14/2024 Prepared by: Lucie Meeter  Exercises - Wrist Flexion Extension AROM - Palms Down  - 2 x daily - 7 x weekly - 2 sets - 10 reps - Putty Squeezes  - 2 x daily - 7 x weekly - 2 sets - 10 reps - Supine Elbow Flexion Extension AROM  - 2 x daily - 7 x weekly - 1 sets - 10 reps - Circular Shoulder Pendulum with Table Support  - 2 x daily - 7 x weekly - 1 sets - 10 reps - Standing Isometric Shoulder External  Rotation with Doorway  - 2 x daily - 7 x weekly - 2 sets - 5 reps - 5 sec  hold - Standing Isometric Shoulder Internal Rotation at Doorway  - 2 x daily - 7 x weekly - 2 sets - 10 reps - 5 sec hold - Standing Isometric Shoulder Abduction with Doorway - Arm Bent  - 1 x daily - 7 x weekly - 2 sets - 5 reps - 5 sec  hold  ASSESSMENT:  CLINICAL IMPRESSION: Patient tolerated PROM of the Rt shoulder well today. Achieves current abduction PROM goal. ER and flexion PROM continue to gradually improve. Introduced cuff and deltoid  isometrics in the sling with ability to properly engage musculature without pain. Isometrics were added to HEP.   EVAL: Patient is a 28 y.o. Rt-handed female who was seen today for physical therapy evaluation and treatment for s/p right shoulder arthroscopy with subacromial decompression, distal clavicle excision, biceps tenodesis, and bankart repair on 06/01/24. She demonstrates RUE ROM and strength deficits that are consistent with recent post-operative status. Patient became nauseous and dizzy following Rt shoulder PROM assessment that was relieved following inclined sitting with ice to the Rt shoulder. She will benefit from skilled PT to address the above stated deficits in order to restore functional use of the RUE and return to professional basketball.     OBJECTIVE IMPAIRMENTS: decreased activity tolerance, decreased endurance, decreased knowledge of condition, decreased ROM, decreased strength, increased edema, increased fascial restrictions, impaired UE functional use, improper body mechanics, postural dysfunction, and pain.   ACTIVITY LIMITATIONS: carrying, lifting, sleeping, bathing, dressing, self feeding, reach over head, and hygiene/grooming  PARTICIPATION LIMITATIONS: meal prep, cleaning, laundry, driving, shopping, community activity, occupation, and yard work  PERSONAL FACTORS: Age, Profession, Time since onset of injury/illness/exacerbation, and 3+ comorbidities: see  PMH above are also affecting patient's functional outcome.   REHAB POTENTIAL: Excellent  CLINICAL DECISION MAKING: Stable/uncomplicated  EVALUATION COMPLEXITY: Low  GOALS: Goals reviewed with patient? Yes  SHORT TERM GOALS: Target date: 07/18/2024    Patient will be independent and compliant with initial HEP.   Baseline: initial HEP issued  Goal status: INITIAL  2.  Patient will demonstrate at least 90 degrees of Rt shoulder flexion AROM to improve ability to complete reaching activity.  Baseline: see PROM above  Goal status: INITIAL  3.  Patient will demonstrate at least 20 degrees of Rt shoulder ER AROM to improve ability to complete self-care activities.  Baseline: see PROM above  Goal status: INITIAL  4. Patient will report pain at worst rated as </=5/10 to improve sleep quality.   Baseline: 11  Goal status: INITIAL   LONG TERM GOALS: Target date: 08/29/2024    Patient will score </= 20 % disability on the QuickDASH (MCID is 8-15.9) to signify clinically meaningful improvement in functional abilities.   Baseline: see above Goal status: INITIAL  2.  Patient will demonstrate at least 4+/5 Rt shoulder strength in order to shoot a basketball.  Baseline: strength testing deferred Goal status: INITIAL  3.  Patient will demonstrate functional and pain free Rt shoulder AROM necessary to rebound and pass basketball.  Baseline: see PROM above  Goal status: INITIAL  4.  Patient will be independent with advanced home program to progress/maintain current level of function  Baseline: initial HEP issued  Goal status: INITIAL  PLAN: PT FREQUENCY: 2x/week  PT DURATION: 12 weeks  PLANNED INTERVENTIONS: 97164- PT Re-evaluation, 97750- Physical Performance Testing, 97110-Therapeutic exercises, 97530- Therapeutic activity, [id number]- Neuromuscular re-education, 97535- Self Care, 02859- Manual therapy, [id number]- Aquatic Therapy, H9716- Electrical stimulation (unattended), [id number]-  Electrical stimulation (manual), 97016- Vasopneumatic device, 20560 (1-2 muscles), 20561 (3+ muscles)- Dry Needling, Patient/Family education, Cryotherapy, and Moist heat  PLAN FOR NEXT SESSION: progress per protocol on file. Gameready not covered.    Raymar Joiner, PT, DPT, ATC 06/14/24 3:01 PM

## 2024-06-16 ENCOUNTER — Ambulatory Visit: Payer: Self-pay | Admitting: Physical Therapy

## 2024-06-16 ENCOUNTER — Encounter: Payer: Self-pay | Admitting: Physical Therapy

## 2024-06-16 DIAGNOSIS — M6281 Muscle weakness (generalized): Secondary | ICD-10-CM

## 2024-06-16 DIAGNOSIS — R600 Localized edema: Secondary | ICD-10-CM

## 2024-06-16 DIAGNOSIS — M25511 Pain in right shoulder: Secondary | ICD-10-CM | POA: Diagnosis not present

## 2024-06-16 NOTE — Therapy (Signed)
 OUTPATIENT PHYSICAL THERAPY UPPER EXTREMITY TREATMENT   Patient Name: Donna Montgomery MRN: [id number] DOB:May 14, 1996, 28 y.o., female Today's Date: 06/16/2024  END OF SESSION:  PT End of Session - 06/16/24 0928     Visit Number 4    Number of Visits 24    Date for Recertification  08/29/24    Authorization Type MCD-heathy blue    Authorization Time Period 9/24-12/23    Authorization - Visit Number 3    Authorization - Number of Visits 13    PT Start Time 0848    PT Stop Time 0938    PT Time Calculation (min) 50 min    Activity Tolerance Patient tolerated treatment well    Behavior During Therapy [facility] for tasks assessed/performed             Past Medical History:  Diagnosis Date   Allergy    Anemia 2014   My blood tests in college said i had low iron and I was given medication. It has been low since.   Anxiety 2014   Had anxiety my whole life, but recognized in college by health professional.   Asthma    Depression 2014   Noticed symptoms in college, was put onto two different medicines. Stopped taking medication and seeked therapy. Symptoms come and go.   Heart murmur 05/08/2019   Ulcer 2013   Bleeding ulcer from H. Bacteria.   Past Surgical History:  Procedure Laterality Date   FRACTURE SURGERY  2017   Hand fracture   OPEN REDUCTION INTERNAL FIXATION (ORIF) HAND Left 2017   POSTERIOR LUMBAR FUSION 2 WITH HARDWARE REMOVAL Right 06/01/2024   Procedure: ARTHROSCOPY, SHOULDER WITH DEBRIDEMENT;  Surgeon: Cristy Bonner DASEN, MD;  Location: [facility];  Service: Orthopedics;  Laterality: Right;  arthroscopy, shoulder with extensive debridement, Bankart repair, decompression, distal clavicle excision, biceps tenodesis   Patient Active Problem List   Diagnosis Date Noted   Degeneration of lateral meniscus, left 02/25/2024   GAD (generalized anxiety disorder) 08/05/2023   Current moderate episode of major depressive disorder without prior episode (HCC) 08/05/2023    Chronic right shoulder pain 08/05/2023   Acute otitis externa of left ear 08/05/2023   Tinnitus of left ear 08/05/2023   Menorrhagia with regular cycle 10/24/2022   Iron deficiency anemia 10/24/2022   Depression, recurrent 03/25/2020   Anxiety 03/25/2020   Chronic toe pain, right foot 07/27/2017   Asthma 08/29/2010   EXERCISE INDUCED ASTHMA 07/11/2010    PCP: Bevin Bernice RAMAN, DO  REFERRING PROVIDER: Jennye Aleck SAILOR, PA-C  REFERRING DIAG: right shoulder arthroscopy with subacromial decompression, distal clavicle excision, biceps tenodesis, bankart repair   THERAPY DIAG:  Acute pain of right shoulder  Muscle weakness (generalized)  Localized edema  Rationale for Evaluation and Treatment: Rehabilitation  ONSET DATE: 06/01/24  SUBJECTIVE:  SUBJECTIVE STATEMENT: Patient reports the shoulder feels good today. The pendulums no longer cause discomfort. Had f/u with Dr. Cristy, which went well. Next f/u is in 3 weeks.    EVAL: Patient underwent Rt shoulder surgery on 9/18. She has f/u with Dr. Cristy on 9/26. She has used all the pain meds and is taking tylenol  as needed for pain. Patient reports the shoulder remains painful described as stabbing and burning. Patient reports sleep is uncomfortable, but remains in her sling at all times except for showering. 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  Hand dominance: Right  PERTINENT HISTORY: None   PAIN:  Are you having pain? Yes: NPRS scale: 1 Pain location: Rt shoulder/upper arm Pain description:  burning,ache Aggravating factors: movement,sleep Relieving factors: ice, medicine  PRECAUTIONS: Other: see post-op protocol for current restrictions  RED FLAGS: None   WEIGHT BEARING RESTRICTIONS: Yes NWB RUE  FALLS:   Has patient fallen in last 6 months? No  LIVING ENVIRONMENT: Lives with: lives with their family Lives in: House/apartment Stairs: Yes: Internal: 15 steps; on right going up Has following equipment at home: None  OCCUPATION: Professional basketball player   PLOF: Independent  PATIENT GOALS: To move my arm above 90. To be able to lift like before. Shoot without pain   NEXT MD VISIT: October 2025   OBJECTIVE:  Note: Objective measures were completed at Evaluation unless otherwise noted.  DIAGNOSTIC FINDINGS:  None on file   PATIENT SURVEYS :  Quick DASH: 88.6% disability   COGNITION: Overall cognitive status: Within functional limits for tasks assessed     SENSATION: Not tested  POSTURE: Rounded shoulders   UPPER EXTREMITY ROM:   Passive ROM Right eval Left eval 06/14/24 Right PROM  Shoulder flexion 55    Shoulder extension     Shoulder abduction 35  45  Shoulder adduction     Shoulder internal rotation     Shoulder external rotation 0  10  Elbow flexion     Elbow extension     Wrist flexion     Wrist extension     Wrist ulnar deviation     Wrist radial deviation     Wrist pronation     Wrist supination     (Blank rows = not tested)  UPPER EXTREMITY MMT: MMT deferred secondary to post-op acuity, current restrictions  MMT Right eval Left eval  Shoulder flexion    Shoulder extension    Shoulder abduction    Shoulder adduction    Shoulder internal rotation    Shoulder external rotation    Middle trapezius    Lower trapezius    Elbow flexion    Elbow extension    Wrist flexion    Wrist extension    Wrist ulnar deviation    Wrist radial deviation    Wrist pronation    Wrist supination    Grip strength (lbs)    (Blank rows = not tested)   [facility] Adult PT Treatment:                                                DATE: 06/16/24 Therapeutic Exercise: HEP review Isometrics in sling 10 x 5 sec hold Abd ER IR Manual Therapy: Rt shoulder PROM  to tolerance within post-op parameters Rt elbow/wrist PROM in all planes to  tolerance   Modalities: VASO- game ready Rt shoulder medium compression 34 degrees x 10 mintues    [facility] Adult PT Treatment:                                                DATE: 06/14/24 Therapeutic Exercise: Abduction, ER, and IR isometric in sling x 5 each  HEP update  Manual Therapy: Rt shoulder PROM to tolerance within post-op parameters Rt elbow/wrist PROM in all planes to tolerance   Modalities: VASO- game ready Rt shoulder medium compression 34 degrees x 10 mintues  Self Care: Protocol review Removed sling pillow and readjusted sling to proper fit.     [facility] Adult PT Treatment:                                                DATE: 06/08/24 Therapeutic Exercise: Supine elbow flexion/extension AROM x 10  Pendulums small range circles x 10  Reviewed and updated HEP  Manual Therapy: Rt shoulder PROM to tolerance within post-op parameters Rt elbow/wrist PROM in all planes to tolerance   Modalities: VASO- game ready Rt shoulder low compression 34 degrees x 10 mintues      PATIENT EDUCATION: Education details: HEP update  Person educated: Patient Education method: Explanation, Demonstration, Tactile cues, Verbal cues, and Handouts Education comprehension: verbalized understanding, returned demonstration, verbal cues required, tactile cues required, and needs further education  HOME EXERCISE PROGRAM: Access Code: [email or web address] URL: [email or web address] Date: 06/14/2024 Prepared by: Lucie Meeter  Exercises - Wrist Flexion Extension AROM - Palms Down  - 2 x daily - 7 x weekly - 2 sets - 10 reps - Putty Squeezes  - 2 x daily - 7 x weekly - 2 sets - 10 reps - Supine Elbow Flexion Extension AROM  - 2 x daily - 7 x weekly - 1 sets - 10 reps - Circular Shoulder Pendulum with Table Support  - 2 x daily - 7 x weekly - 1 sets - 10 reps - Standing Isometric Shoulder External Rotation with  Doorway  - 2 x daily - 7 x weekly - 2 sets - 5 reps - 5 sec  hold - Standing Isometric Shoulder Internal Rotation at Doorway  - 2 x daily - 7 x weekly - 2 sets - 10 reps - 5 sec hold - Standing Isometric Shoulder Abduction with Doorway - Arm Bent  - 1 x daily - 7 x weekly - 2 sets - 5 reps - 5 sec  hold  ASSESSMENT:  CLINICAL IMPRESSION: Pt is progressing well within limits of protocol. She does have fatigue with isometrics but no pain. She is able to reach 90 degrees flexion at end of session after manual work.   EVAL: Patient is a 28 y.o. Rt-handed female who was seen today for physical therapy evaluation and treatment for s/p right shoulder arthroscopy with subacromial decompression, distal clavicle excision, biceps tenodesis, and bankart repair on 06/01/24. She demonstrates RUE ROM and strength deficits that are consistent with recent post-operative status. Patient became nauseous and dizzy following Rt shoulder PROM assessment that was relieved following inclined sitting with ice to the Rt shoulder. She will benefit from skilled PT to address the above stated deficits in order to  restore functional use of the RUE and return to professional basketball.     OBJECTIVE IMPAIRMENTS: decreased activity tolerance, decreased endurance, decreased knowledge of condition, decreased ROM, decreased strength, increased edema, increased fascial restrictions, impaired UE functional use, improper body mechanics, postural dysfunction, and pain.     GOALS: Goals reviewed with patient? Yes  SHORT TERM GOALS: Target date: 07/18/2024    Patient will be independent and compliant with initial HEP.   Baseline: initial HEP issued  Goal status: INITIAL  2.  Patient will demonstrate at least 90 degrees of Rt shoulder flexion AROM to improve ability to complete reaching activity.  Baseline: see PROM above  Goal status: INITIAL  3.  Patient will demonstrate at least 20 degrees of Rt shoulder ER AROM to improve  ability to complete self-care activities.  Baseline: see PROM above  Goal status: INITIAL  4. Patient will report pain at worst rated as </=5/10 to improve sleep quality.   Baseline: 11  Goal status: INITIAL   LONG TERM GOALS: Target date: 08/29/2024    Patient will score </= 20 % disability on the QuickDASH (MCID is 8-15.9) to signify clinically meaningful improvement in functional abilities.   Baseline: see above Goal status: INITIAL  2.  Patient will demonstrate at least 4+/5 Rt shoulder strength in order to shoot a basketball.  Baseline: strength testing deferred Goal status: INITIAL  3.  Patient will demonstrate functional and pain free Rt shoulder AROM necessary to rebound and pass basketball.  Baseline: see PROM above  Goal status: INITIAL  4.  Patient will be independent with advanced home program to progress/maintain current level of function  Baseline: initial HEP issued  Goal status: INITIAL  PLAN: PT FREQUENCY: 2x/week  PT DURATION: 12 weeks  PLANNED INTERVENTIONS: 97164- PT Re-evaluation, 97750- Physical Performance Testing, 97110-Therapeutic exercises, 97530- Therapeutic activity, [id number]- Neuromuscular re-education, 97535- Self Care, [id number]- Manual therapy, [id number]- Aquatic Therapy, H9716- Electrical stimulation (unattended), [id number]- Electrical stimulation (manual), 97016- Vasopneumatic device, 20560 (1-2 muscles), 20561 (3+ muscles)- Dry Needling, Patient/Family education, Cryotherapy, and Moist heat  PLAN FOR NEXT SESSION: progress per protocol on file. Gameready not covered.   Darice Conine, PT,DPT10/03/259:29 AM

## 2024-06-21 ENCOUNTER — Ambulatory Visit: Payer: Self-pay

## 2024-06-21 DIAGNOSIS — M6281 Muscle weakness (generalized): Secondary | ICD-10-CM | POA: Diagnosis not present

## 2024-06-21 DIAGNOSIS — R600 Localized edema: Secondary | ICD-10-CM

## 2024-06-21 DIAGNOSIS — M25511 Pain in right shoulder: Secondary | ICD-10-CM | POA: Diagnosis not present

## 2024-06-21 NOTE — Therapy (Signed)
 OUTPATIENT PHYSICAL THERAPY UPPER EXTREMITY TREATMENT   Patient Name: Donna Montgomery MRN: [id number] DOB:December 05, 1995, 28 y.o., female Today's Date: 06/21/2024  END OF SESSION:  PT End of Session - 06/21/24 1444     Visit Number 5    Number of Visits 24    Date for Recertification  08/29/24    Authorization Type MCD-he[employer] blue    Authorization Time Period 9/24-12/23    Authorization - Visit Number 4    Authorization - Number of Visits 13    PT Start Time 1445    PT Stop Time 1540    PT Time Calculation (min) 55 min    Activity Tolerance Patient tolerated treatment well    Behavior During Therapy WFL for tasks assessed/performed              Past Medical History:  Diagnosis Date   Allergy    Anemia 2014   My blood tests in college said i had low iron and I was given medication. It has been low since.   Anxiety 2014   Had anxiety my whole life, but recognized in college by health professional.   Asthma    Depression 2014   Noticed symptoms in college, was put onto two different medicines. Stopped taking medication and seeked therapy. Symptoms come and go.   Heart murmur 05/08/2019   Ulcer 2013   Bleeding ulcer from H. Bacteria.   Past Surgical History:  Procedure Laterality Date   FRACTURE SURGERY  2017   Hand fracture   OPEN REDUCTION INTERNAL FIXATION (ORIF) HAND Left 2017   POSTERIOR LUMBAR FUSION 2 WITH HARDWARE REMOVAL Right 06/01/2024   Procedure: ARTHROSCOPY, SHOULDER WITH DEBRIDEMENT;  Surgeon: Cristy Bonner DASEN, MD;  Location: [facility];  Service: Orthopedics;  Laterality: Right;  arthroscopy, shoulder with extensive debridement, Bankart repair, decompression, distal clavicle excision, biceps tenodesis   Patient Active Problem List   Diagnosis Date Noted   Degeneration of lateral meniscus, left 02/25/2024   GAD (generalized anxiety disorder) 08/05/2023   Current moderate episode of major depressive disorder without prior episode (HCC)  08/05/2023   Chronic right shoulder pain 08/05/2023   Acute otitis externa of left ear 08/05/2023   Tinnitus of left ear 08/05/2023   Menorrhagia with regular cycle 10/24/2022   Iron deficiency anemia 10/24/2022   Depression, recurrent 03/25/2020   Anxiety 03/25/2020   Chronic toe pain, right foot 07/27/2017   Asthma 08/29/2010   EXERCISE INDUCED ASTHMA 07/11/2010    PCP: Bevin Bernice RAMAN, DO  REFERRING PROVIDER: Jennye Aleck SAILOR, PA-C  REFERRING DIAG: right shoulder arthroscopy with subacromial decompression, distal clavicle excision, biceps tenodesis, bankart repair   THERAPY DIAG:  Acute pain of right shoulder  Muscle weakness (generalized)  Localized edema  Rationale for Evaluation and Treatment: Rehabilitation  ONSET DATE: 06/01/24  SUBJECTIVE:  SUBJECTIVE STATEMENT: Patient reports the shoulder has been hurting more since Friday. Pain is staying the same. Continues to ice and take tylenol .    EVAL: Patient underwent Rt shoulder surgery on 9/18. She has f/u with Dr. Cristy on 9/26. She has used all the pain meds and is taking tylenol  as needed for pain. Patient reports the shoulder remains painful described as stabbing and burning. Patient reports sleep is uncomfortable, but remains in her sling at all times except for showering. 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  Hand dominance: Right  PERTINENT HISTORY: None   PAIN:  Are you having pain? Yes: NPRS scale: 3 Pain location: Rt shoulder/upper arm Pain description:  burning,ache,deep Aggravating factors: movement,sleep Relieving factors: ice, medicine  PRECAUTIONS: Other: see post-op protocol for current restrictions  RED FLAGS: None   WEIGHT BEARING RESTRICTIONS: Yes NWB RUE  FALLS:  Has  patient fallen in last 6 months? No  LIVING ENVIRONMENT: Lives with: lives with their family Lives in: House/apartment Stairs: Yes: Internal: 15 steps; on right going up Has following equipment at home: None  OCCUPATION: Professional basketball player   PLOF: Independent  PATIENT GOALS: To move my arm above 90. To be able to lift like before. Shoot without pain   NEXT MD VISIT: October 2025   OBJECTIVE:  Note: Objective measures were completed at Evaluation unless otherwise noted.  DIAGNOSTIC FINDINGS:  None on file   PATIENT SURVEYS :  Quick DASH: 88.6% disability   COGNITION: Overall cognitive status: Within functional limits for tasks assessed     SENSATION: Not tested  POSTURE: Rounded shoulders   UPPER EXTREMITY ROM:   Passive ROM Right eval Left eval 06/14/24 Right PROM  Shoulder flexion 55    Shoulder extension     Shoulder abduction 35  45  Shoulder adduction     Shoulder internal rotation     Shoulder external rotation 0  10  Elbow flexion     Elbow extension     Wrist flexion     Wrist extension     Wrist ulnar deviation     Wrist radial deviation     Wrist pronation     Wrist supination     (Blank rows = not tested)  UPPER EXTREMITY MMT: MMT deferred secondary to post-op acuity, current restrictions  MMT Right eval Left eval  Shoulder flexion    Shoulder extension    Shoulder abduction    Shoulder adduction    Shoulder internal rotation    Shoulder external rotation    Middle trapezius    Lower trapezius    Elbow flexion    Elbow extension    Wrist flexion    Wrist extension    Wrist ulnar deviation    Wrist radial deviation    Wrist pronation    Wrist supination    Grip strength (lbs)    (Blank rows = not tested)  [facility] Adult PT Treatment:                                                DATE: 06/21/24 Therapeutic Exercise: Supine elbow flex/ext AROM x 10  Supine shoulder flexion AAROM hand clasped x 5; partial range Supine  shoulder abduction AAROM with cane 2 x 5; to 45 degrees  Supine shoulder ER AAROM with cane 2  x 5; partial range  Manual Therapy: Rt shoulder PROM to tolerance within post-op parameters Rt elbow/wrist PROM in all planes to tolerance   Modalities: VASO- game ready Rt shoulder medium compression 34 degrees x 10 mintues  Self Care: Reviewed protocol   [facility] Adult PT Treatment:                                                DATE: 06/16/24 Therapeutic Exercise: HEP review Isometrics in sling 10 x 5 sec hold Abd ER IR Manual Therapy: Rt shoulder PROM to tolerance within post-op parameters Rt elbow/wrist PROM in all planes to tolerance   Modalities: VASO- game ready Rt shoulder medium compression 34 degrees x 10 mintues    [facility] Adult PT Treatment:                                                DATE: 06/14/24 Therapeutic Exercise: Abduction, ER, and IR isometric in sling x 5 each  HEP update  Manual Therapy: Rt shoulder PROM to tolerance within post-op parameters Rt elbow/wrist PROM in all planes to tolerance   Modalities: VASO- game ready Rt shoulder medium compression 34 degrees x 10 mintues  Self Care: Protocol review Removed sling pillow and readjusted sling to proper fit.     [facility] Adult PT Treatment:                                                DATE: 06/08/24 Therapeutic Exercise: Supine elbow flexion/extension AROM x 10  Pendulums small range circles x 10  Reviewed and updated HEP  Manual Therapy: Rt shoulder PROM to tolerance within post-op parameters Rt elbow/wrist PROM in all planes to tolerance   Modalities: VASO- game ready Rt shoulder low compression 34 degrees x 10 mintues      PATIENT EDUCATION: Education details: see treatment  Person educated: Patient Education method: Explanation, Demonstration, Tactile cues, and Verbal cues Education comprehension: verbalized understanding, returned demonstration, verbal cues required, tactile cues required, and  needs further education  HOME EXERCISE PROGRAM: Access Code: [email or web address] URL: [email or web address] Date: 06/14/2024 Prepared by: Lucie Meeter  Exercises - Wrist Flexion Extension AROM - Palms Down  - 2 x daily - 7 x weekly - 2 sets - 10 reps - Putty Squeezes  - 2 x daily - 7 x weekly - 2 sets - 10 reps - Supine Elbow Flexion Extension AROM  - 2 x daily - 7 x weekly - 1 sets - 10 reps - Circular Shoulder Pendulum with Table Support  - 2 x daily - 7 x weekly - 1 sets - 10 reps - Standing Isometric Shoulder External Rotation with Doorway  - 2 x daily - 7 x weekly - 2 sets - 5 reps - 5 sec  hold - Standing Isometric Shoulder Internal Rotation at Doorway  - 2 x daily - 7 x weekly - 2 sets - 10 reps - 5 sec hold - Standing Isometric Shoulder Abduction with Doorway - Arm Bent  - 1 x daily - 7 x weekly - 2 sets - 5 reps - 5  sec  hold  ASSESSMENT:  CLINICAL IMPRESSION: Patient with increased pain and limited range with passive ER ROM compared to previous visits. Continues to tolerate abduction PROM well achieving current protocol range. Fair tolerance to flexion PROM visually achieving approximately 50 degrees. Introduced AAROM within post-op parameters with fairly good tolerance. No changes made to HEP this visit.   EVAL: Patient is a 28 y.o. Rt-handed female who was seen today for physical therapy evaluation and treatment for s/p right shoulder arthroscopy with subacromial decompression, distal clavicle excision, biceps tenodesis, and bankart repair on 06/01/24. She demonstrates RUE ROM and strength deficits that are consistent with recent post-operative status. Patient became nauseous and dizzy following Rt shoulder PROM assessment that was relieved following inclined sitting with ice to the Rt shoulder. She will benefit from skilled PT to address the above stated deficits in order to restore functional use of the RUE and return to professional basketball.     OBJECTIVE IMPAIRMENTS:  decreased activity tolerance, decreased endurance, decreased knowledge of condition, decreased ROM, decreased strength, increased edema, increased fascial restrictions, impaired UE functional use, improper body mechanics, postural dysfunction, and pain.     GOALS: Goals reviewed with patient? Yes  SHORT TERM GOALS: Target date: 07/18/2024    Patient will be independent and compliant with initial HEP.   Baseline: initial HEP issued  Goal status: INITIAL  2.  Patient will demonstrate at least 90 degrees of Rt shoulder flexion AROM to improve ability to complete reaching activity.  Baseline: see PROM above  Goal status: INITIAL  3.  Patient will demonstrate at least 20 degrees of Rt shoulder ER AROM to improve ability to complete self-care activities.  Baseline: see PROM above  Goal status: INITIAL  4. Patient will report pain at worst rated as </=5/10 to improve sleep quality.   Baseline: 11  Goal status: INITIAL   LONG TERM GOALS: Target date: 08/29/2024    Patient will score </= 20 % disability on the QuickDASH (MCID is 8-15.9) to signify clinically meaningful improvement in functional abilities.   Baseline: see above Goal status: INITIAL  2.  Patient will demonstrate at least 4+/5 Rt shoulder strength in order to shoot a basketball.  Baseline: strength testing deferred Goal status: INITIAL  3.  Patient will demonstrate functional and pain free Rt shoulder AROM necessary to rebound and pass basketball.  Baseline: see PROM above  Goal status: INITIAL  4.  Patient will be independent with advanced home program to progress/maintain current level of function  Baseline: initial HEP issued  Goal status: INITIAL  PLAN: PT FREQUENCY: 2x/week  PT DURATION: 12 weeks  PLANNED INTERVENTIONS: 97164- PT Re-evaluation, 97750- Physical Performance Testing, 97110-Therapeutic exercises, 97530- Therapeutic activity, [id number]- Neuromuscular re-education, 97535- Self Care, 02859-  Manual therapy, [id number]- Aquatic Therapy, H9716- Electrical stimulation (unattended), [id number]- Electrical stimulation (manual), 97016- Vasopneumatic device, 20560 (1-2 muscles), 20561 (3+ muscles)- Dry Needling, Patient/Family education, Cryotherapy, and Moist heat  PLAN FOR NEXT SESSION: progress per protocol on file. Gameready not covered.   Jaiah Weigel, PT, DPT, ATC 06/21/24 4:18 PM

## 2024-06-23 ENCOUNTER — Ambulatory Visit: Payer: Self-pay

## 2024-06-23 DIAGNOSIS — M6281 Muscle weakness (generalized): Secondary | ICD-10-CM | POA: Diagnosis not present

## 2024-06-23 DIAGNOSIS — R600 Localized edema: Secondary | ICD-10-CM | POA: Diagnosis not present

## 2024-06-23 DIAGNOSIS — M25511 Pain in right shoulder: Secondary | ICD-10-CM | POA: Diagnosis not present

## 2024-06-23 NOTE — Therapy (Signed)
 OUTPATIENT PHYSICAL THERAPY UPPER EXTREMITY TREATMENT   Patient Name: Donna Montgomery MRN: [id number] DOB:10/22/1995, 28 y.o., female Today's Date: 06/23/2024  END OF SESSION:  PT End of Session - 06/23/24 0847     Visit Number 6    Number of Visits 24    Date for Recertification  08/29/24    Authorization Type [id number]    Authorization Time Period 9/24-12/23    Authorization - Visit Number 5    Authorization - Number of Visits 13    PT Start Time 0847    PT Stop Time 0940    PT Time Calculation (min) 53 min    Activity Tolerance Patient tolerated treatment well    Behavior During Therapy [facility] for tasks assessed/performed               Past Medical History:  Diagnosis Date   Allergy    Anemia 2014   My blood tests in college said i had low iron and I was given medication. It has been low since.   Anxiety 2014   Had anxiety my whole life, but recognized in college by health professional.   Asthma    Depression 2014   Noticed symptoms in college, was put onto two different medicines. Stopped taking medication and seeked therapy. Symptoms come and go.   Heart murmur 05/08/2019   Ulcer 2013   Bleeding ulcer from H. Bacteria.   Past Surgical History:  Procedure Laterality Date   FRACTURE SURGERY  2017   Hand fracture   OPEN REDUCTION INTERNAL FIXATION (ORIF) HAND Left 2017   POSTERIOR LUMBAR FUSION 2 WITH HARDWARE REMOVAL Right 06/01/2024   Procedure: ARTHROSCOPY, SHOULDER WITH DEBRIDEMENT;  Surgeon: Cristy Bonner DASEN, MD;  Location: [facility];  Service: Orthopedics;  Laterality: Right;  arthroscopy, shoulder with extensive debridement, Bankart repair, decompression, distal clavicle excision, biceps tenodesis   Patient Active Problem List   Diagnosis Date Noted   Degeneration of lateral meniscus, left 02/25/2024   GAD (generalized anxiety disorder) 08/05/2023   Current moderate episode of major depressive disorder without prior episode (HCC)  08/05/2023   Chronic right shoulder pain 08/05/2023   Acute otitis externa of left ear 08/05/2023   Tinnitus of left ear 08/05/2023   Menorrhagia with regular cycle 10/24/2022   Iron deficiency anemia 10/24/2022   Depression, recurrent 03/25/2020   Anxiety 03/25/2020   Chronic toe pain, right foot 07/27/2017   Asthma 08/29/2010   EXERCISE INDUCED ASTHMA 07/11/2010    PCP: Bevin Bernice RAMAN, DO  REFERRING PROVIDER: Jennye Aleck SAILOR, PA-C  REFERRING DIAG: right shoulder arthroscopy with subacromial decompression, distal clavicle excision, biceps tenodesis, bankart repair   THERAPY DIAG:  Acute pain of right shoulder  Muscle weakness (generalized)  Localized edema  Rationale for Evaluation and Treatment: Rehabilitation  ONSET DATE: 06/01/24  SUBJECTIVE:  SUBJECTIVE STATEMENT: Patient reports the shoulder feels about the same. The isometrics in the brace are starting to aggravate the shoulder.    EVAL: Patient underwent Rt shoulder surgery on 9/18. She has f/u with Dr. Cristy on 9/26. She has used all the pain meds and is taking tylenol  as needed for pain. Patient reports the shoulder remains painful described as stabbing and burning. Patient reports sleep is uncomfortable, but remains in her sling at all times except for showering. 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  Hand dominance: Right  PERTINENT HISTORY: None   PAIN:  Are you having pain? Yes: NPRS scale: 3 Pain location: Rt shoulder/upper arm Pain description:  burning,ache,deep Aggravating factors: movement,sleep Relieving factors: ice, medicine  PRECAUTIONS: Other: see post-op protocol for current restrictions  RED FLAGS: None   WEIGHT BEARING RESTRICTIONS: Yes NWB RUE  FALLS:  Has patient  fallen in last 6 months? No  LIVING ENVIRONMENT: Lives with: lives with their family Lives in: House/apartment Stairs: Yes: Internal: 15 steps; on right going up Has following equipment at home: None  OCCUPATION: Professional basketball player   PLOF: Independent  PATIENT GOALS: To move my arm above 90. To be able to lift like before. Shoot without pain   NEXT MD VISIT: October 2025   OBJECTIVE:  Note: Objective measures were completed at Evaluation unless otherwise noted.  DIAGNOSTIC FINDINGS:  None on file   PATIENT SURVEYS :  Quick DASH: 88.6% disability   COGNITION: Overall cognitive status: Within functional limits for tasks assessed     SENSATION: Not tested  POSTURE: Rounded shoulders   UPPER EXTREMITY ROM:   Passive ROM Right eval Left eval 06/14/24 Right PROM 06/23/24 Right PROM  Shoulder flexion 55   80  Shoulder extension      Shoulder abduction 35  45   Shoulder adduction      Shoulder internal rotation      Shoulder external rotation 0  10 0  Elbow flexion      Elbow extension      Wrist flexion      Wrist extension      Wrist ulnar deviation      Wrist radial deviation      Wrist pronation      Wrist supination      (Blank rows = not tested)  UPPER EXTREMITY MMT: MMT deferred secondary to post-op acuity, current restrictions  MMT Right eval Left eval  Shoulder flexion    Shoulder extension    Shoulder abduction    Shoulder adduction    Shoulder internal rotation    Shoulder external rotation    Middle trapezius    Lower trapezius    Elbow flexion    Elbow extension    Wrist flexion    Wrist extension    Wrist ulnar deviation    Wrist radial deviation    Wrist pronation    Wrist supination    Grip strength (lbs)    (Blank rows = not tested) [facility] Adult PT Treatment:                                                DATE: 06/23/24 Therapeutic Exercise: Shoulder IR, ER, abduction isometric in sling x 5 each  Reviewed HEP   Manual Therapy: Rt shoulder PROM to tolerance within post-op  parameters Gentle STM with bio-freeze to deltoid, bicep brachii  Modalities: VASO- game ready Rt shoulder medium compression 34 degrees x 10 mintues  Self Care: Use of topical analgesic for pain control Re-try abduction pillow for comfort if needed   [facility] Adult PT Treatment:                                                DATE: 06/21/24 Therapeutic Exercise: Supine elbow flex/ext AROM x 10  Supine shoulder flexion AAROM hand clasped x 5; partial range Supine shoulder abduction AAROM with cane 2 x 5; to 45 degrees  Supine shoulder ER AAROM with cane 2 x 5; partial range  Manual Therapy: Rt shoulder PROM to tolerance within post-op parameters Rt elbow/wrist PROM in all planes to tolerance   Modalities: VASO- game ready Rt shoulder medium compression 34 degrees x 10 mintues  Self Care: Reviewed protocol   [facility] Adult PT Treatment:                                                DATE: 06/16/24 Therapeutic Exercise: HEP review Isometrics in sling 10 x 5 sec hold Abd ER IR Manual Therapy: Rt shoulder PROM to tolerance within post-op parameters Rt elbow/wrist PROM in all planes to tolerance   Modalities: VASO- game ready Rt shoulder medium compression 34 degrees x 10 mintues    [facility] Adult PT Treatment:                                                DATE: 06/14/24 Therapeutic Exercise: Abduction, ER, and IR isometric in sling x 5 each  HEP update  Manual Therapy: Rt shoulder PROM to tolerance within post-op parameters Rt elbow/wrist PROM in all planes to tolerance   Modalities: VASO- game ready Rt shoulder medium compression 34 degrees x 10 mintues  Self Care: Protocol review Removed sling pillow and readjusted sling to proper fit.     [facility] Adult PT Treatment:                                                DATE: 06/08/24 Therapeutic Exercise: Supine elbow flexion/extension AROM x 10  Pendulums small range  circles x 10  Reviewed and updated HEP  Manual Therapy: Rt shoulder PROM to tolerance within post-op parameters Rt elbow/wrist PROM in all planes to tolerance   Modalities: VASO- game ready Rt shoulder low compression 34 degrees x 10 mintues      PATIENT EDUCATION: Education details: see treatment  Person educated: Patient Education method: Explanation, Demonstration, Tactile cues, and Verbal cues Education comprehension: verbalized understanding, returned demonstration, verbal cues required, tactile cues required, and needs further education  HOME EXERCISE PROGRAM: Access Code: [email or web address] URL: [email or web address] Date: 06/14/2024 Prepared by: Lucie Meeter  Exercises - Wrist Flexion Extension AROM - Palms Down  - 2 x daily - 7 x weekly - 2 sets - 10 reps - Putty Squeezes  - 2 x daily -  7 x weekly - 2 sets - 10 reps - Supine Elbow Flexion Extension AROM  - 2 x daily - 7 x weekly - 1 sets - 10 reps - Circular Shoulder Pendulum with Table Support  - 2 x daily - 7 x weekly - 1 sets - 10 reps - Standing Isometric Shoulder External Rotation with Doorway  - 2 x daily - 7 x weekly - 2 sets - 5 reps - 5 sec  hold - Standing Isometric Shoulder Internal Rotation at Doorway  - 2 x daily - 7 x weekly - 2 sets - 10 reps - 5 sec hold - Standing Isometric Shoulder Abduction with Doorway - Arm Bent  - 1 x daily - 7 x weekly - 2 sets - 5 reps - 5 sec  hold  ASSESSMENT:  CLINICAL IMPRESSION: Patient responded well to soft tissue work utilizing topical analgesic reporting a reduction in pain following this intervention. Improved tolerance to passive flexion and ER ROM today compared to previous session. Responds well to prolonged hold in ER and flexion at tolerable range, which then allows for further range as pain subsides. Flexion PROM has significantly improved compared to initial evaluation. ER remains slightly limited compared to previous measurement.   EVAL: Patient is a 28  y.o. Rt-handed female who was seen today for physical therapy evaluation and treatment for s/p right shoulder arthroscopy with subacromial decompression, distal clavicle excision, biceps tenodesis, and bankart repair on 06/01/24. She demonstrates RUE ROM and strength deficits that are consistent with recent post-operative status. Patient became nauseous and dizzy following Rt shoulder PROM assessment that was relieved following inclined sitting with ice to the Rt shoulder. She will benefit from skilled PT to address the above stated deficits in order to restore functional use of the RUE and return to professional basketball.     OBJECTIVE IMPAIRMENTS: decreased activity tolerance, decreased endurance, decreased knowledge of condition, decreased ROM, decreased strength, increased edema, increased fascial restrictions, impaired UE functional use, improper body mechanics, postural dysfunction, and pain.     GOALS: Goals reviewed with patient? Yes  SHORT TERM GOALS: Target date: 07/18/2024    Patient will be independent and compliant with initial HEP.   Baseline: initial HEP issued  Goal status: INITIAL  2.  Patient will demonstrate at least 90 degrees of Rt shoulder flexion AROM to improve ability to complete reaching activity.  Baseline: see PROM above  Goal status: INITIAL  3.  Patient will demonstrate at least 20 degrees of Rt shoulder ER AROM to improve ability to complete self-care activities.  Baseline: see PROM above  Goal status: INITIAL  4. Patient will report pain at worst rated as </=5/10 to improve sleep quality.   Baseline: 11  Goal status: INITIAL   LONG TERM GOALS: Target date: 08/29/2024    Patient will score </= 20 % disability on the QuickDASH (MCID is [id number]) to signify clinically meaningful improvement in functional abilities.   Baseline: see above Goal status: INITIAL  2.  Patient will demonstrate at least 4+/5 Rt shoulder strength in order to shoot a  basketball.  Baseline: strength testing deferred Goal status: INITIAL  3.  Patient will demonstrate functional and pain free Rt shoulder AROM necessary to rebound and pass basketball.  Baseline: see PROM above  Goal status: INITIAL  4.  Patient will be independent with advanced home program to progress/maintain current level of function  Baseline: initial HEP issued  Goal status: INITIAL  PLAN: PT FREQUENCY: 2x/week  PT DURATION:  12 weeks  PLANNED INTERVENTIONS: 97164- PT Re-evaluation, 97750- Physical Performance Testing, 97110-Therapeutic exercises, 97530- Therapeutic activity, [id number]- Neuromuscular re-education, 97535- Self Care, [id number]- Manual therapy, [id number]- Aquatic Therapy, H9716- Electrical stimulation (unattended), [id number]- Electrical stimulation (manual), 97016- Vasopneumatic device, 20560 (1-2 muscles), 20561 (3+ muscles)- Dry Needling, Patient/Family education, Cryotherapy, and Moist heat  PLAN FOR NEXT SESSION: progress per protocol on file. Gameready not covered.   Syon Tews, PT, DPT, ATC 06/23/24 11:14 AM

## 2024-06-28 ENCOUNTER — Ambulatory Visit: Payer: Self-pay

## 2024-06-28 DIAGNOSIS — R600 Localized edema: Secondary | ICD-10-CM | POA: Diagnosis not present

## 2024-06-28 DIAGNOSIS — M6281 Muscle weakness (generalized): Secondary | ICD-10-CM | POA: Diagnosis not present

## 2024-06-28 DIAGNOSIS — M25511 Pain in right shoulder: Secondary | ICD-10-CM | POA: Diagnosis not present

## 2024-06-28 NOTE — Therapy (Signed)
 OUTPATIENT PHYSICAL THERAPY UPPER EXTREMITY TREATMENT   Patient Name: Donna Montgomery MRN: [id number] DOB:1996/06/15, 28 y.o., female Today's Date: 06/28/2024  END OF SESSION:  PT End of Session - 06/28/24 1359     Visit Number 7    Number of Visits 24    Date for Recertification  08/29/24    Authorization Type [id number]    Authorization Time Period 9/24-12/23    Authorization - Visit Number 6    Authorization - Number of Visits 13    PT Start Time 1400    PT Stop Time 1500    PT Time Calculation (min) 60 min    Activity Tolerance Patient tolerated treatment well    Behavior During Therapy WFL for tasks assessed/performed                Past Medical History:  Diagnosis Date   Allergy    Anemia 2014   My blood tests in college said i had low iron and I was given medication. It has been low since.   Anxiety 2014   Had anxiety my whole life, but recognized in college by health professional.   Asthma    Depression 2014   Noticed symptoms in college, was put onto two different medicines. Stopped taking medication and seeked therapy. Symptoms come and go.   Heart murmur 05/08/2019   Ulcer 2013   Bleeding ulcer from H. Bacteria.   Past Surgical History:  Procedure Laterality Date   FRACTURE SURGERY  2017   Hand fracture   OPEN REDUCTION INTERNAL FIXATION (ORIF) HAND Left 2017   POSTERIOR LUMBAR FUSION 2 WITH HARDWARE REMOVAL Right 06/01/2024   Procedure: ARTHROSCOPY, SHOULDER WITH DEBRIDEMENT;  Surgeon: Cristy Bonner DASEN, MD;  [facility]:  [facility];  Service: Orthopedics;  Laterality: Right;  arthroscopy, shoulder with extensive debridement, Bankart repair, decompression, distal clavicle excision, biceps tenodesis   Patient Active Problem List   Diagnosis Date Noted   Degeneration of lateral meniscus, left 02/25/2024   GAD (generalized anxiety disorder) 08/05/2023   Current moderate episode of major depressive disorder without prior episode (HCC)  08/05/2023   Chronic right shoulder pain 08/05/2023   Acute otitis externa of left ear 08/05/2023   Tinnitus of left ear 08/05/2023   Menorrhagia with regular cycle 10/24/2022   Iron deficiency anemia 10/24/2022   Depression, recurrent 03/25/2020   Anxiety 03/25/2020   Chronic toe pain, right foot 07/27/2017   Asthma 08/29/2010   EXERCISE INDUCED ASTHMA 07/11/2010    PCP: Bevin Bernice RAMAN, DO  REFERRING PROVIDER: Jennye Aleck SAILOR, PA-C  REFERRING DIAG: right shoulder arthroscopy with subacromial decompression, distal clavicle excision, biceps tenodesis, bankart repair   THERAPY DIAG:  Acute pain of right shoulder  Muscle weakness (generalized)  Localized edema  Rationale for Evaluation and Treatment: Rehabilitation  ONSET DATE: 06/01/24  SUBJECTIVE:  SUBJECTIVE STATEMENT: Patient reports she is at a 2 today. Has not had to take tylenol  since last visit, but took some today prior to coming here. Biofreeze is helping.    EVAL: Patient underwent Rt shoulder surgery on 9/18. She has f/u with Dr. Cristy on 9/26. She has used all the pain meds and is taking tylenol  as needed for pain. Patient reports the shoulder remains painful described as stabbing and burning. Patient reports sleep is uncomfortable, but remains in her sling at all times except for showering. 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  Hand dominance: Right  PERTINENT HISTORY: None   PAIN:  Are you having pain? Yes: NPRS scale: 2 Pain location: Rt shoulder/upper arm Pain description:  ache Aggravating factors: movement,sleep Relieving factors: ice, medicine  PRECAUTIONS: Other: see post-op protocol for current restrictions  RED FLAGS: None   WEIGHT BEARING RESTRICTIONS: Yes NWB RUE  FALLS:   Has patient fallen in last 6 months? No  LIVING ENVIRONMENT: Lives with: lives with their family Lives in: House/apartment Stairs: Yes: Internal: 15 steps; on right going up Has following equipment at home: None  OCCUPATION: Professional basketball player   PLOF: Independent  PATIENT GOALS: To move my arm above 90. To be able to lift like before. Shoot without pain   NEXT MD VISIT: October 2025   OBJECTIVE:  Note: Objective measures were completed at Evaluation unless otherwise noted.  DIAGNOSTIC FINDINGS:  None on file   PATIENT SURVEYS :  Quick DASH: 88.6% disability   COGNITION: Overall cognitive status: Within functional limits for tasks assessed     SENSATION: Not tested  POSTURE: Rounded shoulders   UPPER EXTREMITY ROM:   Passive ROM Right eval Left eval 06/14/24 Right PROM 06/23/24 Right PROM  Shoulder flexion 55   80  Shoulder extension      Shoulder abduction 35  45   Shoulder adduction      Shoulder internal rotation      Shoulder external rotation 0  10 0  Elbow flexion      Elbow extension      Wrist flexion      Wrist extension      Wrist ulnar deviation      Wrist radial deviation      Wrist pronation      Wrist supination      (Blank rows = not tested)  UPPER EXTREMITY MMT: MMT deferred secondary to post-op acuity, current restrictions  MMT Right eval Left eval  Shoulder flexion    Shoulder extension    Shoulder abduction    Shoulder adduction    Shoulder internal rotation    Shoulder external rotation    Middle trapezius    Lower trapezius    Elbow flexion    Elbow extension    Wrist flexion    Wrist extension    Wrist ulnar deviation    Wrist radial deviation    Wrist pronation    Wrist supination    Grip strength (lbs)    (Blank rows = not tested) [facility] Adult PT Treatment:                                                DATE: 06/28/24 Therapeutic Exercise: Supine shoulder abduction AROM to 45 degrees, elbow  bent Supine shoulder flexion AAROM hand  clasp within post-op restriction  Scapular retraction x 10  Towel slides flexion to 90 degrees x 10  Manual Therapy: Rt shoulder PROM to tolerance within post-op parameters Rt elbow PROM flexion/extension to tolerance   Modalities: VASO- game ready Rt shoulder medium compression 34 degrees x 15 minutes     [facility] Adult PT Treatment:                                                DATE: 06/23/24 Therapeutic Exercise: Shoulder IR, ER, abduction isometric in sling x 5 each  Reviewed HEP  Manual Therapy: Rt shoulder PROM to tolerance within post-op parameters Gentle STM with bio-freeze to deltoid, bicep brachii  Modalities: VASO- game ready Rt shoulder medium compression 34 degrees x 10 mintues  Self Care: Use of topical analgesic for pain control Re-try abduction pillow for comfort if needed   [facility] Adult PT Treatment:                                                DATE: 06/21/24 Therapeutic Exercise: Supine elbow flex/ext AROM x 10  Supine shoulder flexion AAROM hand clasped x 5; partial range Supine shoulder abduction AAROM with cane 2 x 5; to 45 degrees  Supine shoulder ER AAROM with cane 2 x 5; partial range  Manual Therapy: Rt shoulder PROM to tolerance within post-op parameters Rt elbow/wrist PROM in all planes to tolerance   Modalities: VASO- game ready Rt shoulder medium compression 34 degrees x 10 mintues  Self Care: Reviewed protocol   [facility] Adult PT Treatment:                                                DATE: 06/16/24 Therapeutic Exercise: HEP review Isometrics in sling 10 x 5 sec hold Abd ER IR Manual Therapy: Rt shoulder PROM to tolerance within post-op parameters Rt elbow/wrist PROM in all planes to tolerance   Modalities: VASO- game ready Rt shoulder medium compression 34 degrees x 10 mintues    [facility] Adult PT Treatment:                                                DATE: 06/14/24 Therapeutic  Exercise: Abduction, ER, and IR isometric in sling x 5 each  HEP update  Manual Therapy: Rt shoulder PROM to tolerance within post-op parameters Rt elbow/wrist PROM in all planes to tolerance   Modalities: VASO- game ready Rt shoulder medium compression 34 degrees x 10 mintues  Self Care: Protocol review Removed sling pillow and readjusted sling to proper fit.     [facility] Adult PT Treatment:                                                DATE: 06/08/24 Therapeutic Exercise: Supine elbow flexion/extension AROM x 10  Pendulums small  range circles x 10  Reviewed and updated HEP  Manual Therapy: Rt shoulder PROM to tolerance within post-op parameters Rt elbow/wrist PROM in all planes to tolerance   Modalities: VASO- game ready Rt shoulder low compression 34 degrees x 10 mintues      PATIENT EDUCATION: Education details: see treatment  Person educated: Patient Education method: Explanation, Demonstration, Tactile cues, and Verbal cues Education comprehension: verbalized understanding, returned demonstration, verbal cues required, tactile cues required, and needs further education  HOME EXERCISE PROGRAM: Access Code: [email or web address] URL: [email or web address] Date: 06/28/2024 Prepared by: Lucie Meeter  Exercises - Wrist Flexion Extension AROM - Palms Down  - 2 x daily - 7 x weekly - 2 sets - 10 reps - Putty Squeezes  - 2 x daily - 7 x weekly - 2 sets - 10 reps - Supine Elbow Flexion Extension AROM  - 2 x daily - 7 x weekly - 1 sets - 10 reps - Circular Shoulder Pendulum with Table Support  - 2 x daily - 7 x weekly - 1 sets - 10 reps - Standing Isometric Shoulder External Rotation with Doorway  - 2 x daily - 7 x weekly - 2 sets - 5 reps - 5 sec  hold - Standing Isometric Shoulder Internal Rotation at Doorway  - 2 x daily - 7 x weekly - 2 sets - 10 reps - 5 sec hold - Standing Isometric Shoulder Abduction with Doorway - Arm Bent  - 1 x daily - 7 x weekly - 2 sets - 5 reps  - 5 sec  hold - Supine Shoulder Flexion AAROM with Hands Clasped  - 2 x daily - 7 x weekly - 1 sets - 10 reps - 5 sec  hold - Supine Shoulder Abduction AROM  - 2 x daily - 7 x weekly - 1 sets - 10 reps - 5 sec  hold - Seated Scapular Retraction  - 3 x daily - 7 x weekly - 2 sets - 10 reps - Standing Shoulder External Rotation AAROM with Dowel  - 2 x daily - 7 x weekly - 1 sets - 10 reps  ASSESSMENT:  CLINICAL IMPRESSION: Patient tolerated session well today focusing on shoulder ROM activity. Able to initiate shoulder abduction AROM within protocol parameters in gravity minimized positioning without onset of pain. Tolerated AAROM in flexion and ER well noting pain at available end range of each motion within current post-op restrictions.   EVAL: Patient is a 28 y.o. Rt-handed female who was seen today for physical therapy evaluation and treatment for s/p right shoulder arthroscopy with subacromial decompression, distal clavicle excision, biceps tenodesis, and bankart repair on 06/01/24. She demonstrates RUE ROM and strength deficits that are consistent with recent post-operative status. Patient became nauseous and dizzy following Rt shoulder PROM assessment that was relieved following inclined sitting with ice to the Rt shoulder. She will benefit from skilled PT to address the above stated deficits in order to restore functional use of the RUE and return to professional basketball.     OBJECTIVE IMPAIRMENTS: decreased activity tolerance, decreased endurance, decreased knowledge of condition, decreased ROM, decreased strength, increased edema, increased fascial restrictions, impaired UE functional use, improper body mechanics, postural dysfunction, and pain.     GOALS: Goals reviewed with patient? Yes  SHORT TERM GOALS: Target date: 07/18/2024    Patient will be independent and compliant with initial HEP.   Baseline: initial HEP issued  Goal status: INITIAL  2.  Patient will demonstrate at  least  90 degrees of Rt shoulder flexion AROM to improve ability to complete reaching activity.  Baseline: see PROM above  Goal status: INITIAL  3.  Patient will demonstrate at least 20 degrees of Rt shoulder ER AROM to improve ability to complete self-care activities.  Baseline: see PROM above  Goal status: INITIAL  4. Patient will report pain at worst rated as </=5/10 to improve sleep quality.   Baseline: 11  Goal status: INITIAL   LONG TERM GOALS: Target date: 08/29/2024    Patient will score </= 20 % disability on the QuickDASH (MCID is [id number]) to signify clinically meaningful improvement in functional abilities.   Baseline: see above Goal status: INITIAL  2.  Patient will demonstrate at least 4+/5 Rt shoulder strength in order to shoot a basketball.  Baseline: strength testing deferred Goal status: INITIAL  3.  Patient will demonstrate functional and pain free Rt shoulder AROM necessary to rebound and pass basketball.  Baseline: see PROM above  Goal status: INITIAL  4.  Patient will be independent with advanced home program to progress/maintain current level of function  Baseline: initial HEP issued  Goal status: INITIAL  PLAN: PT FREQUENCY: 2x/week  PT DURATION: 12 weeks  PLANNED INTERVENTIONS: 97164- PT Re-evaluation, 97750- Physical Performance Testing, 97110-Therapeutic exercises, 97530- Therapeutic activity, [id number]- Neuromuscular re-education, 97535- Self Care, [id number]- Manual therapy, [id number]- Aquatic Therapy, [id number]- Electrical stimulation (unattended), [id number]- Electrical stimulation (manual), 97016- Vasopneumatic device, 20560 (1-2 muscles), 20561 (3+ muscles)- Dry Needling, Patient/Family education, Cryotherapy, and Moist heat  PLAN FOR NEXT SESSION: progress per protocol on file. Gameready not covered.   Dayzha Pogosyan, PT, DPT, ATC 06/28/24 2:48 PM

## 2024-06-29 ENCOUNTER — Ambulatory Visit

## 2024-06-30 ENCOUNTER — Ambulatory Visit: Attending: Sports Medicine

## 2024-07-03 ENCOUNTER — Ambulatory Visit

## 2024-07-03 DIAGNOSIS — M25511 Pain in right shoulder: Secondary | ICD-10-CM

## 2024-07-03 DIAGNOSIS — R600 Localized edema: Secondary | ICD-10-CM

## 2024-07-03 DIAGNOSIS — M6281 Muscle weakness (generalized): Secondary | ICD-10-CM | POA: Diagnosis not present

## 2024-07-03 NOTE — Therapy (Signed)
 OUTPATIENT PHYSICAL THERAPY UPPER EXTREMITY TREATMENT   Patient Name: Donna Montgomery MRN: [id number] DOB:Aug 03, 1996, 28 y.o., female Today's Date: 07/03/2024  END OF SESSION:  PT End of Session - 07/03/24 0848     Visit Number 8    Number of Visits 24    Date for Recertification  08/29/24    Authorization Type [id number]    Authorization Time Period 9/24-12/23    Authorization - Visit Number 7    Authorization - Number of Visits 13    PT Start Time 0847    PT Stop Time 0945    PT Time Calculation (min) 58 min    Activity Tolerance Patient tolerated treatment well    Behavior During Therapy [facility] for tasks assessed/performed                 Past Medical History:  Diagnosis Date   Allergy    Anemia 2014   My blood tests in college said i had low iron and I was given medication. It has been low since.   Anxiety 2014   Had anxiety my whole life, but recognized in college by health professional.   Asthma    Depression 2014   Noticed symptoms in college, was put onto two different medicines. Stopped taking medication and seeked therapy. Symptoms come and go.   Heart murmur 05/08/2019   Ulcer 2013   Bleeding ulcer from H. Bacteria.   Past Surgical History:  Procedure Laterality Date   FRACTURE SURGERY  2017   Hand fracture   OPEN REDUCTION INTERNAL FIXATION (ORIF) HAND Left 2017   POSTERIOR LUMBAR FUSION 2 WITH HARDWARE REMOVAL Right 06/01/2024   Procedure: ARTHROSCOPY, SHOULDER WITH DEBRIDEMENT;  Surgeon: Donna Bonner DASEN, MD;  Location: [facility];  Service: Orthopedics;  Laterality: Right;  arthroscopy, shoulder with extensive debridement, Bankart repair, decompression, distal clavicle excision, biceps tenodesis   Patient Active Problem List   Diagnosis Date Noted   Degeneration of lateral meniscus, left 02/25/2024   GAD (generalized anxiety disorder) 08/05/2023   Current moderate episode of major depressive disorder without prior episode (HCC)  08/05/2023   Chronic right shoulder pain 08/05/2023   Acute otitis externa of left ear 08/05/2023   Tinnitus of left ear 08/05/2023   Menorrhagia with regular cycle 10/24/2022   Iron deficiency anemia 10/24/2022   Depression, recurrent 03/25/2020   Anxiety 03/25/2020   Chronic toe pain, right foot 07/27/2017   Asthma 08/29/2010   EXERCISE INDUCED ASTHMA 07/11/2010    PCP: Donna Bernice RAMAN, DO  REFERRING PROVIDER: Jennye Aleck SAILOR, PA-C  REFERRING DIAG: right shoulder arthroscopy with subacromial decompression, distal clavicle excision, biceps tenodesis, bankart repair   THERAPY DIAG:  Acute pain of right shoulder  Muscle weakness (generalized)  Localized edema  Rationale for Evaluation and Treatment: Rehabilitation  ONSET DATE: 06/01/24  SUBJECTIVE:  SUBJECTIVE STATEMENT: Patient had f/u with Dr. Cristy on Friday and was instructed that she could progress to phase II of rehab. She is cleared from sling. She has f/u in 1 month to potentially discuss knee interventions.    EVAL: Patient underwent Rt shoulder surgery on 9/18. She has f/u with Dr. Cristy on 9/26. She has used all the pain meds and is taking tylenol  as needed for pain. Patient reports the shoulder remains painful described as stabbing and burning. Patient reports sleep is uncomfortable, but remains in her sling at all times except for showering. 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  Hand dominance: Right  PERTINENT HISTORY: None   PAIN:  Are you having pain? Yes: NPRS scale: 2 Pain location: Rt shoulder/upper arm Pain description:  ache Aggravating factors: movement,sleep Relieving factors: ice, medicine  PRECAUTIONS: Other: see post-op protocol for current restrictions; per patient cleared to  begin phase II rehab  RED FLAGS: None   WEIGHT BEARING RESTRICTIONS: Yes NWB RUE  FALLS:  Has patient fallen in last 6 months? No  LIVING ENVIRONMENT: Lives with: lives with their family Lives in: House/apartment Stairs: Yes: Internal: 15 steps; on right going up Has following equipment at home: None  OCCUPATION: Professional basketball player   PLOF: Independent  PATIENT GOALS: To move my arm above 90. To be able to lift like before. Shoot without pain   NEXT MD VISIT: November 2025   OBJECTIVE:  Note: Objective measures were completed at Evaluation unless otherwise noted.  DIAGNOSTIC FINDINGS:  None on file   PATIENT SURVEYS :  Quick DASH: 88.6% disability   COGNITION: Overall cognitive status: Within functional limits for tasks assessed     SENSATION: Not tested  POSTURE: Rounded shoulders   UPPER EXTREMITY ROM:   Passive ROM Right eval Left eval 06/14/24 Right PROM 06/23/24 Right PROM  Shoulder flexion 55   80  Shoulder extension      Shoulder abduction 35  45   Shoulder adduction      Shoulder internal rotation      Shoulder external rotation 0  10 0  Elbow flexion      Elbow extension      Wrist flexion      Wrist extension      Wrist ulnar deviation      Wrist radial deviation      Wrist pronation      Wrist supination      (Blank rows = not tested)  UPPER EXTREMITY MMT: MMT deferred secondary to post-op acuity, current restrictions  MMT Right eval Left eval  Shoulder flexion    Shoulder extension    Shoulder abduction    Shoulder adduction    Shoulder internal rotation    Shoulder external rotation    Middle trapezius    Lower trapezius    Elbow flexion    Elbow extension    Wrist flexion    Wrist extension    Wrist ulnar deviation    Wrist radial deviation    Wrist pronation    Wrist supination    Grip strength (lbs)    (Blank rows = not tested) [facility] Adult PT Treatment:                                                 DATE: 07/03/24  Therapeutic Exercise: Supine shoulder flexion AAROM with dowel x 10  Supine shoulder ER AAROM with dowel x 10  Standing shoulder abduction AAROM with dowel x 10  Standing shoulder towel slides flexion x 5 HEP update  Manual Therapy: Rt shoulder PROM flexion, abduction, ER to tolerance  Neuromuscular re-ed: Scapular retraction x 10 standing   Modalities: VASO- game ready Rt shoulder medium compression 34 degrees x 15 mintues     [facility] Adult PT Treatment:                                                DATE: 06/28/24 Therapeutic Exercise: Supine shoulder abduction AROM to 45 degrees, elbow bent Supine shoulder flexion AAROM hand clasp within post-op restriction  Scapular retraction x 10  Towel slides flexion to 90 degrees x 10  Manual Therapy: Rt shoulder PROM to tolerance within post-op parameters Rt elbow PROM flexion/extension to tolerance   Modalities: VASO- game ready Rt shoulder medium compression 34 degrees x 15 minutes     [facility] Adult PT Treatment:                                                DATE: 06/23/24 Therapeutic Exercise: Shoulder IR, ER, abduction isometric in sling x 5 each  Reviewed HEP  Manual Therapy: Rt shoulder PROM to tolerance within post-op parameters Gentle STM with bio-freeze to deltoid, bicep brachii  Modalities: VASO- game ready Rt shoulder medium compression 34 degrees x 10 mintues  Self Care: Use of topical analgesic for pain control Re-try abduction pillow for comfort if needed   [facility] Adult PT Treatment:                                                DATE: 06/21/24 Therapeutic Exercise: Supine elbow flex/ext AROM x 10  Supine shoulder flexion AAROM hand clasped x 5; partial range Supine shoulder abduction AAROM with cane 2 x 5; to 45 degrees  Supine shoulder ER AAROM with cane 2 x 5; partial range  Manual Therapy: Rt shoulder PROM to tolerance within post-op parameters Rt elbow/wrist PROM in all planes to tolerance    Modalities: VASO- game ready Rt shoulder medium compression 34 degrees x 10 mintues  Self Care: Reviewed protocol   [facility] Adult PT Treatment:                                                DATE: 06/16/24 Therapeutic Exercise: HEP review Isometrics in sling 10 x 5 sec hold Abd ER IR Manual Therapy: Rt shoulder PROM to tolerance within post-op parameters Rt elbow/wrist PROM in all planes to tolerance   Modalities: VASO- game ready Rt shoulder medium compression 34 degrees x 10 mintues     PATIENT EDUCATION: Education details: see treatment  Person educated: Patient Education method: Explanation, Demonstration, Tactile cues, and Verbal cues, handout  Education comprehension: verbalized understanding, returned demonstration, verbal cues required, tactile cues required, and needs further education  HOME EXERCISE PROGRAM: Access Code: [email or web address] URL: [email or web address] Date: 07/03/2024 Prepared by: Lucie Meeter  Exercises - Standing Isometric Shoulder External Rotation with Doorway  - 2 x daily - 7 x weekly - 2 sets - 5 reps - 5 sec  hold - Standing Isometric Shoulder Internal Rotation at Doorway  - 2 x daily - 7 x weekly - 2 sets - 10 reps - 5 sec hold - Standing Isometric Shoulder Abduction with Doorway - Arm Bent  - 1 x daily - 7 x weekly - 2 sets - 5 reps - 5 sec  hold - Supine Shoulder Flexion AAROM with Hands Clasped  - 2 x daily - 7 x weekly - 1 sets - 10 reps - 5 sec  hold - Supine Shoulder Abduction AROM  - 2 x daily - 7 x weekly - 1 sets - 10 reps - 5 sec  hold - Seated Scapular Retraction  - 3 x daily - 7 x weekly - 2 sets - 10 reps - Standing Shoulder External Rotation AAROM with Dowel  - 2 x daily - 7 x weekly - 1 sets - 10 reps - Supine Shoulder Flexion Extension AAROM with Dowel  - 1 x daily - 7 x weekly - 1 sets - 10 reps - Supine Shoulder External Rotation with Dowel  - 1 x daily - 7 x weekly - 1 sets - 10 reps - Standing Shoulder Abduction  AAROM with Dowel  - 1 x daily - 7 x weekly - 1 sets - 10 reps - Shoulder Flexion Wall Slide with Towel  - 1 x daily - 7 x weekly - 1 sets - 10 reps  ASSESSMENT:  CLINICAL IMPRESSION: Patient arrives with minimal Rt shoulder pain and is cleared for phase II of rehab. Tolerated Rt shoulder PROM well with most limitation remaining in ER. Progressed AAROM with patient tolerating these activities well reporting tightness, but no increase in pain. Challenged with flexion wall slides reporting weakness as limiting factor, but no pain. HEP was updated to include further AAROM activity.   EVAL: Patient is a 28 y.o. Rt-handed female who was seen today for physical therapy evaluation and treatment for s/p right shoulder arthroscopy with subacromial decompression, distal clavicle excision, biceps tenodesis, and bankart repair on 06/01/24. She demonstrates RUE ROM and strength deficits that are consistent with recent post-operative status. Patient became nauseous and dizzy following Rt shoulder PROM assessment that was relieved following inclined sitting with ice to the Rt shoulder. She will benefit from skilled PT to address the above stated deficits in order to restore functional use of the RUE and return to professional basketball.     OBJECTIVE IMPAIRMENTS: decreased activity tolerance, decreased endurance, decreased knowledge of condition, decreased ROM, decreased strength, increased edema, increased fascial restrictions, impaired UE functional use, improper body mechanics, postural dysfunction, and pain.     GOALS: Goals reviewed with patient? Yes  SHORT TERM GOALS: Target date: 07/18/2024    Patient will be independent and compliant with initial HEP.   Baseline: initial HEP issued  Goal status: MET  2.  Patient will demonstrate at least 90 degrees of Rt shoulder flexion AROM to improve ability to complete reaching activity.  Baseline: see PROM above  Goal status: INITIAL  3.  Patient will  demonstrate at least 20 degrees of Rt shoulder ER AROM to improve ability to complete self-care activities.  Baseline: see PROM above  Goal status: INITIAL  4. Patient will report pain at worst rated as </=  5/10 to improve sleep quality.   Baseline: 11  Goal status: INITIAL   LONG TERM GOALS: Target date: 08/29/2024    Patient will score </= 20 % disability on the QuickDASH (MCID is [id number]) to signify clinically meaningful improvement in functional abilities.   Baseline: see above Goal status: INITIAL  2.  Patient will demonstrate at least 4+/5 Rt shoulder strength in order to shoot a basketball.  Baseline: strength testing deferred Goal status: INITIAL  3.  Patient will demonstrate functional and pain free Rt shoulder AROM necessary to rebound and pass basketball.  Baseline: see PROM above  Goal status: INITIAL  4.  Patient will be independent with advanced home program to progress/maintain current level of function  Baseline: initial HEP issued  Goal status: INITIAL  PLAN: PT FREQUENCY: 2x/week  PT DURATION: 12 weeks  PLANNED INTERVENTIONS: 97164- PT Re-evaluation, 97750- Physical Performance Testing, 97110-Therapeutic exercises, 97530- Therapeutic activity, [id number]- Neuromuscular re-education, 97535- Self Care, [id number]- Manual therapy, [id number]- Aquatic Therapy, [id number]- Electrical stimulation (unattended), [id number]- Electrical stimulation (manual), 97016- Vasopneumatic device, 20560 (1-2 muscles), 20561 (3+ muscles)- Dry Needling, Patient/Family education, Cryotherapy, and Moist heat  PLAN FOR NEXT SESSION: progress per protocol on file. Gameready not covered.   Kohle Winner, PT, DPT, ATC 07/03/24 9:36 AM

## 2024-07-06 ENCOUNTER — Ambulatory Visit

## 2024-07-06 DIAGNOSIS — M25511 Pain in right shoulder: Secondary | ICD-10-CM

## 2024-07-06 DIAGNOSIS — R600 Localized edema: Secondary | ICD-10-CM

## 2024-07-06 DIAGNOSIS — M6281 Muscle weakness (generalized): Secondary | ICD-10-CM

## 2024-07-06 NOTE — Therapy (Signed)
 OUTPATIENT PHYSICAL THERAPY UPPER EXTREMITY TREATMENT   Patient Name: Donna Montgomery MRN: [id number] DOB:July 14, 1996, 28 y.o., female Today's Date: 07/06/2024  END OF SESSION:  PT End of Session - 07/06/24 0846     Visit Number 9    Number of Visits 24    Date for Recertification  08/29/24    Authorization Type [employer]he[employer] [id number]    Authorization Time Period 9/24-12/23    Authorization - Visit Number 8    Authorization - Number of Visits 13    PT Start Time 0846    PT Stop Time 0945    PT Time Calculation (min) 59 min    Activity Tolerance Patient tolerated treatment well    Behavior During Therapy [facility] for tasks assessed/performed                  Past Medical History:  Diagnosis Date   Allergy    Anemia 2014   My blood tests in college said i had low iron and I was given medication. It has been low since.   Anxiety 2014   Had anxiety my whole life, but recognized in college by health professional.   Asthma    Depression 2014   Noticed symptoms in college, was put onto two different medicines. Stopped taking medication and seeked therapy. Symptoms come and go.   Heart murmur 05/08/2019   Ulcer 2013   Bleeding ulcer from H. Bacteria.   Past Surgical History:  Procedure Laterality Date   FRACTURE SURGERY  2017   Hand fracture   OPEN REDUCTION INTERNAL FIXATION (ORIF) HAND Left 2017   POSTERIOR LUMBAR FUSION 2 WITH HARDWARE REMOVAL Right 06/01/2024   Procedure: ARTHROSCOPY, SHOULDER WITH DEBRIDEMENT;  Surgeon: Cristy Bonner DASEN, MD;  Location: [facility];  Service: Orthopedics;  Laterality: Right;  arthroscopy, shoulder with extensive debridement, Bankart repair, decompression, distal clavicle excision, biceps tenodesis   Patient Active Problem List   Diagnosis Date Noted   Degeneration of lateral meniscus, left 02/25/2024   GAD (generalized anxiety disorder) 08/05/2023   Current moderate episode of major depressive disorder without prior episode (HCC)  08/05/2023   Chronic right shoulder pain 08/05/2023   Acute otitis externa of left ear 08/05/2023   Tinnitus of left ear 08/05/2023   Menorrhagia with regular cycle 10/24/2022   Iron deficiency anemia 10/24/2022   Depression, recurrent 03/25/2020   Anxiety 03/25/2020   Chronic toe pain, right foot 07/27/2017   Asthma 08/29/2010   EXERCISE INDUCED ASTHMA 07/11/2010    PCP: Bevin Bernice RAMAN, DO  REFERRING PROVIDER: Jennye Aleck SAILOR, PA-C  REFERRING DIAG: right shoulder arthroscopy with subacromial decompression, distal clavicle excision, biceps tenodesis, bankart repair   THERAPY DIAG:  Acute pain of right shoulder  Muscle weakness (generalized)  Localized edema  Rationale for Evaluation and Treatment: Rehabilitation  ONSET DATE: 06/01/24  SUBJECTIVE:  SUBJECTIVE STATEMENT: Patient reports the arm was sore after last session. Completed HEP the following day and took tylenol . Feels better today.    EVAL: Patient underwent Rt shoulder surgery on 9/18. She has f/u with Dr. Cristy on 9/26. She has used all the pain meds and is taking tylenol  as needed for pain. Patient reports the shoulder remains painful described as stabbing and burning. Patient reports sleep is uncomfortable, but remains in her sling at all times except for showering. 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  Hand dominance: Right  PERTINENT HISTORY: None   PAIN:  Are you having pain? Yes: NPRS scale: 3 Pain location: Rt shoulder/upper arm Pain description:  sore Aggravating factors: movement,sleep Relieving factors: ice, medicine  PRECAUTIONS: Other: see post-op protocol for current restrictions; per patient cleared to begin phase II rehab  RED FLAGS: None   WEIGHT BEARING RESTRICTIONS: Yes  NWB RUE  FALLS:  Has patient fallen in last 6 months? No  LIVING ENVIRONMENT: Lives with: lives with their family Lives in: House/apartment Stairs: Yes: Internal: 15 steps; on right going up Has following equipment at home: None  OCCUPATION: Professional basketball player   PLOF: Independent  PATIENT GOALS: To move my arm above 90. To be able to lift like before. Shoot without pain   NEXT MD VISIT: November 2025   OBJECTIVE:  Note: Objective measures were completed at Evaluation unless otherwise noted.  DIAGNOSTIC FINDINGS:  None on file   PATIENT SURVEYS :  Quick DASH: 88.6% disability   COGNITION: Overall cognitive status: Within functional limits for tasks assessed     SENSATION: Not tested  POSTURE: Rounded shoulders   UPPER EXTREMITY ROM:   Passive ROM Right eval Left eval 06/14/24 Right PROM 06/23/24 Right PROM  Shoulder flexion 55   80  Shoulder extension      Shoulder abduction 35  45   Shoulder adduction      Shoulder internal rotation      Shoulder external rotation 0  10 0  Elbow flexion      Elbow extension      Wrist flexion      Wrist extension      Wrist ulnar deviation      Wrist radial deviation      Wrist pronation      Wrist supination      (Blank rows = not tested)  UPPER EXTREMITY MMT: MMT deferred secondary to post-op acuity, current restrictions  MMT Right eval Left eval  Shoulder flexion    Shoulder extension    Shoulder abduction    Shoulder adduction    Shoulder internal rotation    Shoulder external rotation    Middle trapezius    Lower trapezius    Elbow flexion    Elbow extension    Wrist flexion    Wrist extension    Wrist ulnar deviation    Wrist radial deviation    Wrist pronation    Wrist supination    Grip strength (lbs)    (Blank rows = not tested) [facility] Adult PT Treatment:                                                DATE: 07/06/24 Therapeutic Exercise: Seated physioball rollout x 10; 5 sec  hold  Standing ER AROM x 10  Seated abduction towel slide x 5  HEP update   Neuromuscular re-ed: ER/IR reactive isometrics yellow band x 10 each  Sidelying shoulder abduction x 5 elbow bent, x 5 arm straight  Prone scapular retraction 2 x 10  Therapeutic Activity: Pulleys flexion x 2 minutes  Supine shoulder AAROM with physioball x 5 Wall slide flexion x 5   Modalities: VASO- game ready Rt shoulder medium compression 34 degrees x 15 mintues     [facility] Adult PT Treatment:                                                DATE: 07/03/24 Therapeutic Exercise: Supine shoulder flexion AAROM with dowel x 10  Supine shoulder ER AAROM with dowel x 10  Standing shoulder abduction AAROM with dowel x 10  Standing shoulder towel slides flexion x 5 HEP update  Manual Therapy: Rt shoulder PROM flexion, abduction, ER to tolerance  Neuromuscular re-ed: Scapular retraction x 10 standing   Modalities: VASO- game ready Rt shoulder medium compression 34 degrees x 15 mintues     [facility] Adult PT Treatment:                                                DATE: 06/28/24 Therapeutic Exercise: Supine shoulder abduction AROM to 45 degrees, elbow bent Supine shoulder flexion AAROM hand clasp within post-op restriction  Scapular retraction x 10  Towel slides flexion to 90 degrees x 10  Manual Therapy: Rt shoulder PROM to tolerance within post-op parameters Rt elbow PROM flexion/extension to tolerance   Modalities: VASO- game ready Rt shoulder medium compression 34 degrees x 15 minutes     [facility] Adult PT Treatment:                                                DATE: 06/23/24 Therapeutic Exercise: Shoulder IR, ER, abduction isometric in sling x 5 each  Reviewed HEP  Manual Therapy: Rt shoulder PROM to tolerance within post-op parameters Gentle STM with bio-freeze to deltoid, bicep brachii  Modalities: VASO- game ready Rt shoulder medium compression 34 degrees x 10 mintues  Self Care: Use of  topical analgesic for pain control Re-try abduction pillow for comfort if needed   [facility] Adult PT Treatment:                                                DATE: 06/21/24 Therapeutic Exercise: Supine elbow flex/ext AROM x 10  Supine shoulder flexion AAROM hand clasped x 5; partial range Supine shoulder abduction AAROM with cane 2 x 5; to 45 degrees  Supine shoulder ER AAROM with cane 2 x 5; partial range  Manual Therapy: Rt shoulder PROM to tolerance within post-op parameters Rt elbow/wrist PROM in all planes to tolerance   Modalities: VASO- game ready Rt shoulder medium compression 34 degrees x 10 mintues  Self Care: Reviewed protocol     PATIENT EDUCATION: Education details: see treatment  Person  educated: Patient Education method: Explanation, Demonstration, Tactile cues, and Verbal cues, handout  Education comprehension: verbalized understanding, returned demonstration, verbal cues required, tactile cues required, and needs further education  HOME EXERCISE PROGRAM: Access Code: [email or web address] URL: [email or web address] Date: 07/06/2024 Prepared by: Lucie Meeter  Exercises - Supine Shoulder Flexion AAROM with Hands Clasped  - 1 x daily - 7 x weekly - 1 sets - 10 reps - 5 sec  hold - Seated Scapular Retraction  - 3 x daily - 7 x weekly - 2 sets - 10 reps - Standing Shoulder External Rotation AAROM with Dowel  - 1 x daily - 7 x weekly - 1 sets - 10 reps - Supine Shoulder Flexion Extension AAROM with Dowel  - 1 x daily - 7 x weekly - 1 sets - 10 reps - Supine Shoulder External Rotation with Dowel  - 1 x daily - 7 x weekly - 1 sets - 10 reps - Standing Shoulder Abduction AAROM with Dowel  - 1 x daily - 7 x weekly - 1 sets - 10 reps - Shoulder Flexion Wall Slide with Towel  - 1 x daily - 7 x weekly - 1 sets - 10 reps - Shoulder External Rotation Reactive Isometrics  - 1 x daily - 7 x weekly - 2 sets - 10 reps - Shoulder Internal Rotation Reactive Isometrics  - 1 x daily  - 7 x weekly - 2 sets - 10 reps - Seated 3 Way Exercise Ball Roll Out Stretch  - 1 x daily - 7 x weekly - 1 sets - 10 reps - Sidelying Shoulder Abduction  - 1 x daily - 7 x weekly - 1 sets - 10 reps - Shoulder External Rotation and Scapular Retraction  - 1 x daily - 7 x weekly - 1 sets - 10 reps - Prone Scapular Retraction  - 1 x daily - 7 x weekly - 2 sets - 10 reps  ASSESSMENT:  CLINICAL IMPRESSION: Patient with minimal Rt shoulder soreness upon arrival. Introduced reactive rotator cuff isometrics today without onset of pain, though fatigues with these exercises. Introduced AROM today with significant weakness noted with sidelying shoulder abduction as she is only able to complete through minimal range with short lever arm. Improved tolerance and range noted with wall slide today compared to previous visit. She reported soreness at conclusion of session and was recommended to continue to ice frequently throughout the day.   EVAL: Patient is a 28 y.o. Rt-handed female who was seen today for physical therapy evaluation and treatment for s/p right shoulder arthroscopy with subacromial decompression, distal clavicle excision, biceps tenodesis, and bankart repair on 06/01/24. She demonstrates RUE ROM and strength deficits that are consistent with recent post-operative status. Patient became nauseous and dizzy following Rt shoulder PROM assessment that was relieved following inclined sitting with ice to the Rt shoulder. She will benefit from skilled PT to address the above stated deficits in order to restore functional use of the RUE and return to professional basketball.     OBJECTIVE IMPAIRMENTS: decreased activity tolerance, decreased endurance, decreased knowledge of condition, decreased ROM, decreased strength, increased edema, increased fascial restrictions, impaired UE functional use, improper body mechanics, postural dysfunction, and pain.     GOALS: Goals reviewed with patient? Yes  SHORT  TERM GOALS: Target date: 07/18/2024    Patient will be independent and compliant with initial HEP.   Baseline: initial HEP issued  Goal status: MET  2.  Patient will demonstrate at least 90  degrees of Rt shoulder flexion AROM to improve ability to complete reaching activity.  Baseline: see PROM above  Goal status: INITIAL  3.  Patient will demonstrate at least 20 degrees of Rt shoulder ER AROM to improve ability to complete self-care activities.  Baseline: see PROM above  Goal status: INITIAL  4. Patient will report pain at worst rated as </=5/10 to improve sleep quality.   Baseline: 11  Goal status: INITIAL   LONG TERM GOALS: Target date: 08/29/2024    Patient will score </= 20 % disability on the QuickDASH (MCID is [id number]) to signify clinically meaningful improvement in functional abilities.   Baseline: see above Goal status: INITIAL  2.  Patient will demonstrate at least 4+/5 Rt shoulder strength in order to shoot a basketball.  Baseline: strength testing deferred Goal status: INITIAL  3.  Patient will demonstrate functional and pain free Rt shoulder AROM necessary to rebound and pass basketball.  Baseline: see PROM above  Goal status: INITIAL  4.  Patient will be independent with advanced home program to progress/maintain current level of function  Baseline: initial HEP issued  Goal status: INITIAL  PLAN: PT FREQUENCY: 2x/week  PT DURATION: 12 weeks  PLANNED INTERVENTIONS: 97164- PT Re-evaluation, 97750- Physical Performance Testing, 97110-Therapeutic exercises, 97530- Therapeutic activity, [id number]- Neuromuscular re-education, 97535- Self Care, [id number]- Manual therapy, [id number]- Aquatic Therapy, H9716- Electrical stimulation (unattended), [id number]- Electrical stimulation (manual), 97016- Vasopneumatic device, 20560 (1-2 muscles), 20561 (3+ muscles)- Dry Needling, Patient/Family education, Cryotherapy, and Moist heat  PLAN FOR NEXT SESSION: progress per protocol on file.  Gameready not covered.   Ravonda Brecheen, PT, DPT, ATC 07/06/24 9:47 AM

## 2024-07-07 DIAGNOSIS — F411 Generalized anxiety disorder: Secondary | ICD-10-CM | POA: Diagnosis not present

## 2024-07-10 ENCOUNTER — Ambulatory Visit

## 2024-07-12 ENCOUNTER — Ambulatory Visit

## 2024-07-12 DIAGNOSIS — R600 Localized edema: Secondary | ICD-10-CM | POA: Diagnosis not present

## 2024-07-12 DIAGNOSIS — M6281 Muscle weakness (generalized): Secondary | ICD-10-CM | POA: Diagnosis not present

## 2024-07-12 DIAGNOSIS — M25511 Pain in right shoulder: Secondary | ICD-10-CM

## 2024-07-12 NOTE — Therapy (Signed)
 OUTPATIENT PHYSICAL THERAPY UPPER EXTREMITY TREATMENT   Patient Name: Donna Montgomery MRN: [id number] DOB:Dec 21, 1995, 28 y.o., female Today's Date: 07/12/2024  END OF SESSION:  PT End of Session - 07/12/24 0845     Visit Number 10    Number of Visits 24    Date for Recertification  08/29/24    Authorization Type [employer]he[employer]    Authorization Time Period 9/24-12/23    Authorization - Visit Number 9    Authorization - Number of Visits 13    PT Start Time 0845    PT Stop Time 0945    PT Time Calculation (min) 60 min    Activity Tolerance Patient tolerated treatment well    Behavior During Therapy [facility] for tasks assessed/performed                   Past Medical History:  Diagnosis Date   Allergy    Anemia 2014   My blood tests in college said i had low iron and I was given medication. It has been low since.   Anxiety 2014   Had anxiety my whole life, but recognized in college by health professional.   Asthma    Depression 2014   Noticed symptoms in college, was put onto two different medicines. Stopped taking medication and seeked therapy. Symptoms come and go.   Heart murmur 05/08/2019   Ulcer 2013   Bleeding ulcer from H. Bacteria.   Past Surgical History:  Procedure Laterality Date   FRACTURE SURGERY  2017   Hand fracture   OPEN REDUCTION INTERNAL FIXATION (ORIF) HAND Left 2017   POSTERIOR LUMBAR FUSION 2 WITH HARDWARE REMOVAL Right 06/01/2024   Procedure: ARTHROSCOPY, SHOULDER WITH DEBRIDEMENT;  Surgeon: Cristy Bonner DASEN, MD;  Location: [facility];  Service: Orthopedics;  Laterality: Right;  arthroscopy, shoulder with extensive debridement, Bankart repair, decompression, distal clavicle excision, biceps tenodesis   Patient Active Problem List   Diagnosis Date Noted   Degeneration of lateral meniscus, left 02/25/2024   GAD (generalized anxiety disorder) 08/05/2023   Current moderate episode of major depressive disorder without prior episode  (HCC) 08/05/2023   Chronic right shoulder pain 08/05/2023   Acute otitis externa of left ear 08/05/2023   Tinnitus of left ear 08/05/2023   Menorrhagia with regular cycle 10/24/2022   Iron deficiency anemia 10/24/2022   Depression, recurrent 03/25/2020   Anxiety 03/25/2020   Chronic toe pain, right foot 07/27/2017   Asthma 08/29/2010   EXERCISE INDUCED ASTHMA 07/11/2010    PCP: Bevin Bernice RAMAN, DO  REFERRING PROVIDER: Jennye Aleck SAILOR, PA-C  REFERRING DIAG: right shoulder arthroscopy with subacromial decompression, distal clavicle excision, biceps tenodesis, bankart repair   THERAPY DIAG:  Acute pain of right shoulder  Muscle weakness (generalized)  Localized edema  Rationale for Evaluation and Treatment: Rehabilitation  ONSET DATE: 06/01/24  SUBJECTIVE:  SUBJECTIVE STATEMENT: Was sore the day after last session. Completed HEP and would get sore the day following. Right now pain is 2-3.    EVAL: Patient underwent Rt shoulder surgery on 9/18. She has f/u with Dr. Cristy on 9/26. She has used all the pain meds and is taking tylenol  as needed for pain. Patient reports the shoulder remains painful described as stabbing and burning. Patient reports sleep is uncomfortable, but remains in her sling at all times except for showering. 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  Hand dominance: Right  PERTINENT HISTORY: None   PAIN:  Are you having pain? Yes: NPRS scale: 2-3 Pain location: Rt anterior shoulder Pain description:  ache Aggravating factors: movement,sleep Relieving factors: ice, medicine  PRECAUTIONS: Other: see post-op protocol for current restrictions; per patient cleared to begin phase II rehab  RED FLAGS: None   WEIGHT BEARING RESTRICTIONS: Yes NWB  RUE  FALLS:  Has patient fallen in last 6 months? No  LIVING ENVIRONMENT: Lives with: lives with their family Lives in: House/apartment Stairs: Yes: Internal: 15 steps; on right going up Has following equipment at home: None  OCCUPATION: Professional basketball player   PLOF: Independent  PATIENT GOALS: To move my arm above 90. To be able to lift like before. Shoot without pain   NEXT MD VISIT: November 2025   OBJECTIVE:  Note: Objective measures were completed at Evaluation unless otherwise noted.  DIAGNOSTIC FINDINGS:  None on file   PATIENT SURVEYS :  Quick DASH: 88.6% disability   COGNITION: Overall cognitive status: Within functional limits for tasks assessed     SENSATION: Not tested  POSTURE: Rounded shoulders   UPPER EXTREMITY ROM:   Passive ROM Right eval Left eval 06/14/24 Right PROM 06/23/24 Right PROM 07/12/24 Right PROM  Shoulder flexion 55   80 125  Shoulder extension       Shoulder abduction 35  45  85  Shoulder adduction       Shoulder internal rotation       Shoulder external rotation 0  10 0 12  Elbow flexion       Elbow extension       Wrist flexion       Wrist extension       Wrist ulnar deviation       Wrist radial deviation       Wrist pronation       Wrist supination       (Blank rows = not tested)  UPPER EXTREMITY MMT: MMT deferred secondary to post-op acuity, current restrictions  MMT Right eval Left eval  Shoulder flexion    Shoulder extension    Shoulder abduction    Shoulder adduction    Shoulder internal rotation    Shoulder external rotation    Middle trapezius    Lower trapezius    Elbow flexion    Elbow extension    Wrist flexion    Wrist extension    Wrist ulnar deviation    Wrist radial deviation    Wrist pronation    Wrist supination    Grip strength (lbs)    (Blank rows = not tested) [facility] Adult PT Treatment:                                                DATE:  07/12/24 Therapeutic  Exercise: Doorway ER stretch x 5; 10 sec hold Sidelying ER 2 x 5  HEP update  Manual Therapy: Rt shoulder flexion, ER, and abduction PROM with gentle stretching at tolerable end range   Therapeutic Activity: Pulleys flexion/ scaption x 2 min each Sidelying shoulder flexion AROM x 5  Inclined shoulder flexion AAROM hand clasp x 5  Inclined shoulder flexion AAROM physioball x 5  Finger ladder flexion x 5 Modalities: VASO- game ready Rt shoulder medium compression 34 degrees x 15 mintues     [facility] Adult PT Treatment:                                                DATE: 07/06/24 Therapeutic Exercise: Seated physioball rollout x 10; 5 sec hold  Standing ER AROM x 10  Seated abduction towel slide x 5  HEP update   Neuromuscular re-ed: ER/IR reactive isometrics yellow band x 10 each  Sidelying shoulder abduction x 5 elbow bent, x 5 arm straight  Prone scapular retraction 2 x 10  Therapeutic Activity: Pulleys flexion x 2 minutes  Supine shoulder AAROM with physioball x 5 Wall slide flexion x 5   Modalities: VASO- game ready Rt shoulder medium compression 34 degrees x 15 mintues     [facility] Adult PT Treatment:                                                DATE: 07/03/24 Therapeutic Exercise: Supine shoulder flexion AAROM with dowel x 10  Supine shoulder ER AAROM with dowel x 10  Standing shoulder abduction AAROM with dowel x 10  Standing shoulder towel slides flexion x 5 HEP update  Manual Therapy: Rt shoulder PROM flexion, abduction, ER to tolerance  Neuromuscular re-ed: Scapular retraction x 10 standing   Modalities: VASO- game ready Rt shoulder medium compression 34 degrees x 15 mintues     PATIENT EDUCATION: Education details: HEP update Person educated: Patient Education method: Explanation, Demonstration, Tactile cues, and Verbal cues, handout  Education comprehension: verbalized understanding, returned demonstration, verbal cues required, tactile cues required,  and needs further education  HOME EXERCISE PROGRAM: Access Code: [email or web address] URL: [email or web address] Date: 07/12/2024 Prepared by: Lucie Meeter  Exercises - Supine Shoulder Flexion AAROM with Hands Clasped  - 1 x daily - 7 x weekly - 1 sets - 10 reps - 5 sec  hold - Seated Scapular Retraction  - 3 x daily - 7 x weekly - 2 sets - 10 reps - Standing Shoulder External Rotation AAROM with Dowel  - 1 x daily - 7 x weekly - 1 sets - 10 reps - Supine Shoulder Flexion Extension AAROM with Dowel  - 1 x daily - 7 x weekly - 1 sets - 10 reps - Standing Shoulder Abduction AAROM with Dowel  - 1 x daily - 7 x weekly - 1 sets - 10 reps - Shoulder Flexion Wall Slide with Towel  - 1 x daily - 7 x weekly - 1 sets - 10 reps - Shoulder External Rotation Reactive Isometrics  - 1 x daily - 7 x weekly - 2 sets - 10 reps - Shoulder Internal Rotation Reactive Isometrics  - 1 x daily - 7 x weekly -  2 sets - 10 reps - Seated 3 Way Exercise Ball Roll Out Stretch  - 1 x daily - 7 x weekly - 1 sets - 10 reps - Sidelying Shoulder Abduction  - 1 x daily - 7 x weekly - 1 sets - 10 reps - Shoulder External Rotation and Scapular Retraction  - 1 x daily - 7 x weekly - 1 sets - 10 reps - Prone Scapular Retraction  - 1 x daily - 7 x weekly - 2 sets - 10 reps - Sidelying Shoulder External Rotation  - 1 x daily - 7 x weekly - 2 sets - 5 reps - Sidelying Shoulder Flexion 15 Degrees  - 1 x daily - 7 x weekly - 2 sets - 5 reps - Standing Shoulder External Rotation Stretch in Doorway  - 1 x daily - 7 x weekly - 5 sets - 10 sec  hold ASSESSMENT:  CLINICAL IMPRESSION: Rt shoulder PROM continues to gradually improve in all planes with ER remaining most limited secondary to pain. Progressed flexion and ER AROM in gravity minimized positioning with patient quickly fatiguing, but no increase in pain. Progressed shoulder flexion AAROM in inclined positioning with good tolerance.   EVAL: Patient is a 28 y.o. Rt-handed  female who was seen today for physical therapy evaluation and treatment for s/p right shoulder arthroscopy with subacromial decompression, distal clavicle excision, biceps tenodesis, and bankart repair on 06/01/24. She demonstrates RUE ROM and strength deficits that are consistent with recent post-operative status. Patient became nauseous and dizzy following Rt shoulder PROM assessment that was relieved following inclined sitting with ice to the Rt shoulder. She will benefit from skilled PT to address the above stated deficits in order to restore functional use of the RUE and return to professional basketball.     OBJECTIVE IMPAIRMENTS: decreased activity tolerance, decreased endurance, decreased knowledge of condition, decreased ROM, decreased strength, increased edema, increased fascial restrictions, impaired UE functional use, improper body mechanics, postural dysfunction, and pain.     GOALS: Goals reviewed with patient? Yes  SHORT TERM GOALS: Target date: 07/18/2024    Patient will be independent and compliant with initial HEP.   Baseline: initial HEP issued  Goal status: MET  2.  Patient will demonstrate at least 90 degrees of Rt shoulder flexion AROM to improve ability to complete reaching activity.  Baseline: see PROM above  Goal status: INITIAL  3.  Patient will demonstrate at least 20 degrees of Rt shoulder ER AROM to improve ability to complete self-care activities.  Baseline: see PROM above  Goal status: INITIAL  4. Patient will report pain at worst rated as </=5/10 to improve sleep quality.   Baseline: 11  Goal status: INITIAL   LONG TERM GOALS: Target date: 08/29/2024    Patient will score </= 20 % disability on the QuickDASH (MCID is 8-15.9) to signify clinically meaningful improvement in functional abilities.   Baseline: see above Goal status: INITIAL  2.  Patient will demonstrate at least 4+/5 Rt shoulder strength in order to shoot a basketball.  Baseline:  strength testing deferred Goal status: INITIAL  3.  Patient will demonstrate functional and pain free Rt shoulder AROM necessary to rebound and pass basketball.  Baseline: see PROM above  Goal status: INITIAL  4.  Patient will be independent with advanced home program to progress/maintain current level of function  Baseline: initial HEP issued  Goal status: INITIAL  PLAN: PT FREQUENCY: 2x/week  PT DURATION: 12 weeks  PLANNED INTERVENTIONS: 02835-  PT Re-evaluation, 97750- Physical Performance Testing, 97110-Therapeutic exercises, 97530- Therapeutic activity, [id number]- Neuromuscular re-education, 97535- Self Care, 02859- Manual therapy, [phone]- Aquatic Therapy, [phone]- Electrical stimulation (unattended), [id number]- Electrical stimulation (manual), 97016- Vasopneumatic device, 20560 (1-2 muscles), 20561 (3+ muscles)- Dry Needling, Patient/Family education, Cryotherapy, and Moist heat  PLAN FOR NEXT SESSION: progress per protocol on file. Gameready not covered.   Zulma Court, PT, DPT, ATC 07/12/24 10:20 AM

## 2024-07-17 ENCOUNTER — Ambulatory Visit: Attending: Sports Medicine

## 2024-07-17 DIAGNOSIS — M25511 Pain in right shoulder: Secondary | ICD-10-CM | POA: Insufficient documentation

## 2024-07-17 DIAGNOSIS — R600 Localized edema: Secondary | ICD-10-CM | POA: Diagnosis not present

## 2024-07-17 DIAGNOSIS — M6281 Muscle weakness (generalized): Secondary | ICD-10-CM | POA: Diagnosis not present

## 2024-07-17 NOTE — Therapy (Signed)
 OUTPATIENT PHYSICAL THERAPY UPPER EXTREMITY TREATMENT   Patient Name: Donna Montgomery MRN: [id number] DOB:1996-05-31, 28 y.o., female Today's Date: 07/17/2024  END OF SESSION:  PT End of Session - 07/17/24 0848     Visit Number 11    Number of Visits 24    Date for Recertification  08/29/24    Authorization Type [employer]he[employer] [id number]    Authorization Time Period 9/24-12/23    Authorization - Visit Number 10    Authorization - Number of Visits 13    PT Start Time 0848    PT Stop Time 0945    PT Time Calculation (min) 57 min    Activity Tolerance Patient tolerated treatment well    Behavior During Therapy [facility] for tasks assessed/performed                    Past Medical History:  Diagnosis Date   Allergy    Anemia 2014   My blood tests in college said i had low iron and I was given medication. It has been low since.   Anxiety 2014   Had anxiety my whole life, but recognized in college by health professional.   Asthma    Depression 2014   Noticed symptoms in college, was put onto two different medicines. Stopped taking medication and seeked therapy. Symptoms come and go.   Heart murmur 05/08/2019   Ulcer 2013   Bleeding ulcer from H. Bacteria.   Past Surgical History:  Procedure Laterality Date   FRACTURE SURGERY  2017   Hand fracture   OPEN REDUCTION INTERNAL FIXATION (ORIF) HAND Left 2017   POSTERIOR LUMBAR FUSION 2 WITH HARDWARE REMOVAL Right 06/01/2024   Procedure: ARTHROSCOPY, SHOULDER WITH DEBRIDEMENT;  Surgeon: Cristy Bonner DASEN, MD;  Location: [facility];  Service: Orthopedics;  Laterality: Right;  arthroscopy, shoulder with extensive debridement, Bankart repair, decompression, distal clavicle excision, biceps tenodesis   Patient Active Problem List   Diagnosis Date Noted   Degeneration of lateral meniscus, left 02/25/2024   GAD (generalized anxiety disorder) 08/05/2023   Current moderate episode of major depressive disorder without prior episode  (HCC) 08/05/2023   Chronic right shoulder pain 08/05/2023   Acute otitis externa of left ear 08/05/2023   Tinnitus of left ear 08/05/2023   Menorrhagia with regular cycle 10/24/2022   Iron deficiency anemia 10/24/2022   Depression, recurrent 03/25/2020   Anxiety 03/25/2020   Chronic toe pain, right foot 07/27/2017   Asthma 08/29/2010   EXERCISE INDUCED ASTHMA 07/11/2010    PCP: Bevin Bernice RAMAN, DO  REFERRING PROVIDER: Jennye Aleck SAILOR, PA-C  REFERRING DIAG: right shoulder arthroscopy with subacromial decompression, distal clavicle excision, biceps tenodesis, bankart repair   THERAPY DIAG:  Acute pain of right shoulder  Muscle weakness (generalized)  Localized edema  Rationale for Evaluation and Treatment: Rehabilitation  ONSET DATE: 06/01/24  SUBJECTIVE:  SUBJECTIVE STATEMENT: Patient reports shoulder was really sore for 2 days after last session. She rested over the weekend and now the shoulder feels really good without pain.    EVAL: Patient underwent Rt shoulder surgery on 9/18. She has f/u with Dr. Cristy on 9/26. She has used all the pain meds and is taking tylenol  as needed for pain. Patient reports the shoulder remains painful described as stabbing and burning. Patient reports sleep is uncomfortable, but remains in her sling at all times except for showering. 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  Hand dominance: Right  PERTINENT HISTORY: None   PAIN:  Are you having pain? Yes: NPRS scale: none currently; at worst 7 Pain location: Rt anterior shoulder Pain description:  ache Aggravating factors: movement,sleep Relieving factors: ice, medicine  PRECAUTIONS: Other: see post-op protocol for current restrictions; per patient cleared to begin phase II  rehab  RED FLAGS: None   WEIGHT BEARING RESTRICTIONS: Yes NWB RUE  FALLS:  Has patient fallen in last 6 months? No  LIVING ENVIRONMENT: Lives with: lives with their family Lives in: House/apartment Stairs: Yes: Internal: 15 steps; on right going up Has following equipment at home: None  OCCUPATION: Professional basketball player   PLOF: Independent  PATIENT GOALS: To move my arm above 90. To be able to lift like before. Shoot without pain   NEXT MD VISIT: November 2025   OBJECTIVE:  Note: Objective measures were completed at Evaluation unless otherwise noted.  DIAGNOSTIC FINDINGS:  None on file   PATIENT SURVEYS :  Quick DASH: 88.6% disability   COGNITION: Overall cognitive status: Within functional limits for tasks assessed     SENSATION: Not tested  POSTURE: Rounded shoulders   UPPER EXTREMITY ROM:   Passive ROM Right eval Left eval 06/14/24 Right PROM 06/23/24 Right PROM 07/12/24 Right PROM 07/17/24 Right AROM  Shoulder flexion 55   80 125 110  Shoulder extension        Shoulder abduction 35  45  85   Shoulder adduction        Shoulder internal rotation        Shoulder external rotation 0  10 0 12 15  Elbow flexion        Elbow extension        Wrist flexion        Wrist extension        Wrist ulnar deviation        Wrist radial deviation        Wrist pronation        Wrist supination        (Blank rows = not tested)  UPPER EXTREMITY MMT: MMT deferred secondary to post-op acuity, current restrictions  MMT Right eval Left eval  Shoulder flexion    Shoulder extension    Shoulder abduction    Shoulder adduction    Shoulder internal rotation    Shoulder external rotation    Middle trapezius    Lower trapezius    Elbow flexion    Elbow extension    Wrist flexion    Wrist extension    Wrist ulnar deviation    Wrist radial deviation    Wrist pronation    Wrist supination    Grip strength (lbs)    (Blank rows = not tested) [facility]  Adult PT Treatment:  DATE: 07/17/24  Neuromuscular re-ed: Prone row 2 x 10  Bilateral ER x 10  Supine serratus punch x 10  Therapeutic Activity: Finger ladder flexion x 5; attempted abduction unable to control upper trap engagement Inclined shoulder flexion AAROM with dowel x 10  Supine shoulder flexion AROM 2 x 5  Seated shoulder abduction short lever 2 x 5  Inclined shoulder flexion AROM 2 x 5  Wall slides flexion x 5  Standing shoulder abduction AROM long lever x 5 Modalities: VASO- game ready Rt shoulder medium compression 34 degrees x 15 mintues     [facility] Adult PT Treatment:                                                DATE: 07/12/24 Therapeutic Exercise: Doorway ER stretch x 5; 10 sec hold Sidelying ER 2 x 5  HEP update  Manual Therapy: Rt shoulder flexion, ER, and abduction PROM with gentle stretching at tolerable end range   Therapeutic Activity: Pulleys flexion/ scaption x 2 min each Sidelying shoulder flexion AROM x 5  Inclined shoulder flexion AAROM hand clasp x 5  Inclined shoulder flexion AAROM physioball x 5  Finger ladder flexion x 5 Modalities: VASO- game ready Rt shoulder medium compression 34 degrees x 15 mintues     [facility] Adult PT Treatment:                                                DATE: 07/06/24 Therapeutic Exercise: Seated physioball rollout x 10; 5 sec hold  Standing ER AROM x 10  Seated abduction towel slide x 5  HEP update   Neuromuscular re-ed: ER/IR reactive isometrics yellow band x 10 each  Sidelying shoulder abduction x 5 elbow bent, x 5 arm straight  Prone scapular retraction 2 x 10  Therapeutic Activity: Pulleys flexion x 2 minutes  Supine shoulder AAROM with physioball x 5 Wall slide flexion x 5   Modalities: VASO- game ready Rt shoulder medium compression 34 degrees x 15 mintues       PATIENT EDUCATION: Education details: HEP update Person educated: Patient Education  method: Explanation, Demonstration, Tactile cues, and Verbal cues, handout  Education comprehension: verbalized understanding, returned demonstration, verbal cues required, tactile cues required, and needs further education  HOME EXERCISE PROGRAM: Access Code: [email or web address] URL: [email or web address] Date: 07/17/2024 Prepared by: Lucie Meeter  Exercises - Supine Shoulder Flexion AAROM with Hands Clasped  - 1 x daily - 7 x weekly - 1 sets - 10 reps - 5 sec  hold - Supine Shoulder Flexion Extension Full Range AROM  - 1 x daily - 7 x weekly - 2 sets - 5 reps - Standing Shoulder External Rotation AAROM with Dowel  - 1 x daily - 7 x weekly - 1 sets - 10 reps - Standing Shoulder Abduction AAROM with Dowel  - 1 x daily - 7 x weekly - 1 sets - 10 reps - Shoulder Flexion Wall Slide with Towel  - 1 x daily - 7 x weekly - 1 sets - 10 reps - Shoulder External Rotation Reactive Isometrics  - 1 x daily - 7 x weekly - 2 sets - 10 reps - Shoulder Internal Rotation Reactive Isometrics  -  1 x daily - 7 x weekly - 2 sets - 10 reps - Seated 3 Way Exercise Ball Roll Out Stretch  - 1 x daily - 7 x weekly - 1 sets - 10 reps - Sidelying Shoulder Abduction  - 1 x daily - 7 x weekly - 1 sets - 10 reps - Shoulder External Rotation and Scapular Retraction  - 1 x daily - 7 x weekly - 1 sets - 10 reps - Sidelying Shoulder External Rotation  - 1 x daily - 7 x weekly - 2 sets - 5 reps - Standing Shoulder External Rotation Stretch in Doorway  - 1 x daily - 7 x weekly - 5 sets - 10 sec  hold - Prone Scapular Retraction  - 1 x daily - 7 x weekly - 2 sets - 10 reps - Prone Shoulder Row  - 1 x daily - 7 x weekly - 2 sets - 10 reps ASSESSMENT:  CLINICAL IMPRESSION: Patient arrives without reports of pain. Continued with ROM progression into more gravity dependent positioning with good tolerance. With finger ladder abduction she is unable to control for excessive upper trap engagement, so this was discontinued. With  gravity dependent shoulder abduction AROM she is able to complete through approximately 45 degrees without shoulder shrug. Fatigues with inclined shoulder flexion AROM, but no onset of pain. Has met 2/4 STG at this time.   EVAL: Patient is a 28 y.o. Rt-handed female who was seen today for physical therapy evaluation and treatment for s/p right shoulder arthroscopy with subacromial decompression, distal clavicle excision, biceps tenodesis, and bankart repair on 06/01/24. She demonstrates RUE ROM and strength deficits that are consistent with recent post-operative status. Patient became nauseous and dizzy following Rt shoulder PROM assessment that was relieved following inclined sitting with ice to the Rt shoulder. She will benefit from skilled PT to address the above stated deficits in order to restore functional use of the RUE and return to professional basketball.     OBJECTIVE IMPAIRMENTS: decreased activity tolerance, decreased endurance, decreased knowledge of condition, decreased ROM, decreased strength, increased edema, increased fascial restrictions, impaired UE functional use, improper body mechanics, postural dysfunction, and pain.     GOALS: Goals reviewed with patient? Yes  SHORT TERM GOALS: Target date: 07/18/2024    Patient will be independent and compliant with initial HEP.   Baseline: initial HEP issued  Goal status: MET  2.  Patient will demonstrate at least 90 degrees of Rt shoulder flexion AROM to improve ability to complete reaching activity.  Baseline: see PROM above  Goal status: MET  3.  Patient will demonstrate at least 20 degrees of Rt shoulder ER AROM to improve ability to complete self-care activities.  Baseline: see PROM above  Goal status: progressing  4. Patient will report pain at worst rated as </=5/10 to improve sleep quality.   Baseline: 11  07/17/24: 7  Goal status: progressing   LONG TERM GOALS: Target date: 08/29/2024    Patient will score </=  20 % disability on the QuickDASH (MCID is [id number]) to signify clinically meaningful improvement in functional abilities.   Baseline: see above Goal status: INITIAL  2.  Patient will demonstrate at least 4+/5 Rt shoulder strength in order to shoot a basketball.  Baseline: strength testing deferred Goal status: INITIAL  3.  Patient will demonstrate functional and pain free Rt shoulder AROM necessary to rebound and pass basketball.  Baseline: see PROM above  Goal status: INITIAL  4.  Patient will  be independent with advanced home program to progress/maintain current level of function  Baseline: initial HEP issued  Goal status: INITIAL  PLAN: PT FREQUENCY: 2x/week  PT DURATION: 12 weeks  PLANNED INTERVENTIONS: 97164- PT Re-evaluation, 97750- Physical Performance Testing, 97110-Therapeutic exercises, 97530- Therapeutic activity, [id number]- Neuromuscular re-education, 97535- Self Care, 02859- Manual therapy, [id number]- Aquatic Therapy, H9716- Electrical stimulation (unattended), [id number]- Electrical stimulation (manual), [id number]- Vasopneumatic device, 20560 (1-2 muscles), 20561 (3+ muscles)- Dry Needling, Patient/Family education, Cryotherapy, and Moist heat  PLAN FOR NEXT SESSION: progress per protocol on file. Gameready not covered.   Madalin Hughart, PT, DPT, ATC 07/17/24 9:37 AM

## 2024-07-19 ENCOUNTER — Ambulatory Visit

## 2024-07-24 ENCOUNTER — Ambulatory Visit

## 2024-07-24 DIAGNOSIS — M25511 Pain in right shoulder: Secondary | ICD-10-CM | POA: Diagnosis not present

## 2024-07-24 DIAGNOSIS — M6281 Muscle weakness (generalized): Secondary | ICD-10-CM

## 2024-07-24 DIAGNOSIS — R600 Localized edema: Secondary | ICD-10-CM

## 2024-07-24 NOTE — Therapy (Signed)
 OUTPATIENT PHYSICAL THERAPY UPPER EXTREMITY TREATMENT   Patient Name: Donna Montgomery MRN: [id number] DOB:08-10-1996, 28 y.o., female Today's Date: 07/24/2024  END OF SESSION:  PT End of Session - 07/24/24 0847     Visit Number 12    Number of Visits 24    Date for Recertification  08/29/24    Authorization Type [id number]    Authorization Time Period 9/24-12/23    Authorization - Visit Number 11    Authorization - Number of Visits 13    PT Start Time 0847    PT Stop Time 0945    PT Time Calculation (min) 58 min    Activity Tolerance Patient tolerated treatment well    Behavior During Therapy [facility] for tasks assessed/performed                     Past Medical History:  Diagnosis Date   Allergy    Anemia 2014   My blood tests in college said i had low iron and I was given medication. It has been low since.   Anxiety 2014   Had anxiety my whole life, but recognized in college by health professional.   Asthma    Depression 2014   Noticed symptoms in college, was put onto two different medicines. Stopped taking medication and seeked therapy. Symptoms come and go.   Heart murmur 05/08/2019   Ulcer 2013   Bleeding ulcer from H. Bacteria.   Past Surgical History:  Procedure Laterality Date   FRACTURE SURGERY  2017   Hand fracture   OPEN REDUCTION INTERNAL FIXATION (ORIF) HAND Left 2017   POSTERIOR LUMBAR FUSION 2 WITH HARDWARE REMOVAL Right 06/01/2024   Procedure: ARTHROSCOPY, SHOULDER WITH DEBRIDEMENT;  Surgeon: Cristy Bonner DASEN, MD;  Location: [facility];  Service: Orthopedics;  Laterality: Right;  arthroscopy, shoulder with extensive debridement, Bankart repair, decompression, distal clavicle excision, biceps tenodesis   Patient Active Problem List   Diagnosis Date Noted   Degeneration of lateral meniscus, left 02/25/2024   GAD (generalized anxiety disorder) 08/05/2023   Current moderate episode of major depressive disorder without prior  episode (HCC) 08/05/2023   Chronic right shoulder pain 08/05/2023   Acute otitis externa of left ear 08/05/2023   Tinnitus of left ear 08/05/2023   Menorrhagia with regular cycle 10/24/2022   Iron deficiency anemia 10/24/2022   Depression, recurrent 03/25/2020   Anxiety 03/25/2020   Chronic toe pain, right foot 07/27/2017   Asthma 08/29/2010   EXERCISE INDUCED ASTHMA 07/11/2010    PCP: Bevin Bernice RAMAN, DO  REFERRING PROVIDER: Jennye Aleck SAILOR, PA-C  REFERRING DIAG: right shoulder arthroscopy with subacromial decompression, distal clavicle excision, biceps tenodesis, bankart repair   THERAPY DIAG:  Acute pain of right shoulder  Muscle weakness (generalized)  Localized edema  Rationale for Evaluation and Treatment: Rehabilitation  ONSET DATE: 06/01/24  SUBJECTIVE:  SUBJECTIVE STATEMENT: Patient reports the shoulder is feeling pretty good. Not as sore after last visit as it previously had been following PT. Has f/u with Dr. Cristy tomorrow.    EVAL: Patient underwent Rt shoulder surgery on 9/18. She has f/u with Dr. Cristy on 9/26. She has used all the pain meds and is taking tylenol  as needed for pain. Patient reports the shoulder remains painful described as stabbing and burning. Patient reports sleep is uncomfortable, but remains in her sling at all times except for showering. 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  Hand dominance: Right  PERTINENT HISTORY: None   PAIN:  Are you having pain? Yes: NPRS scale: none currently; at worst 4 Pain location: Rt anterior shoulder Pain description:  ache Aggravating factors: reaching Relieving factors: ice, medicine  PRECAUTIONS: Other: see post-op protocol for current restrictions; per patient cleared to begin phase II  rehab  RED FLAGS: None   WEIGHT BEARING RESTRICTIONS: Yes NWB RUE  FALLS:  Has patient fallen in last 6 months? No  LIVING ENVIRONMENT: Lives with: lives with their family Lives in: House/apartment Stairs: Yes: Internal: 15 steps; on right going up Has following equipment at home: None  OCCUPATION: Professional basketball player   PLOF: Independent  PATIENT GOALS: To move my arm above 90. To be able to lift like before. Shoot without pain   NEXT MD VISIT: November 2025   OBJECTIVE:  Note: Objective measures were completed at Evaluation unless otherwise noted.  DIAGNOSTIC FINDINGS:  None on file   PATIENT SURVEYS :  Quick DASH: 88.6% disability   COGNITION: Overall cognitive status: Within functional limits for tasks assessed     SENSATION: Not tested  POSTURE: Rounded shoulders   UPPER EXTREMITY ROM:   Passive ROM Right eval Left eval 06/14/24 Right PROM 06/23/24 Right PROM 07/12/24 Right PROM 07/17/24 Right AROM 07/25/23 Right AROM   Shoulder flexion 55   80 125 110 122  Shoulder extension         Shoulder abduction 35  45  85  85  Shoulder adduction         Shoulder internal rotation         Shoulder external rotation 0  10 0 12 15 28   Elbow flexion         Elbow extension         Wrist flexion         Wrist extension         Wrist ulnar deviation         Wrist radial deviation         Wrist pronation         Wrist supination         (Blank rows = not tested)  UPPER EXTREMITY MMT: MMT deferred secondary to post-op acuity, current restrictions  MMT Right eval Left eval  Shoulder flexion    Shoulder extension    Shoulder abduction    Shoulder adduction    Shoulder internal rotation    Shoulder external rotation    Middle trapezius    Lower trapezius    Elbow flexion    Elbow extension    Wrist flexion    Wrist extension    Wrist ulnar deviation    Wrist radial deviation    Wrist pronation    Wrist supination    Grip strength  (lbs)    (Blank rows = not tested) [facility] Adult PT Treatment:  DATE: 07/24/24 Therapeutic Exercise: Reviewed and updated HEP  Manual Therapy: Rt shoulder PROM to tolerance flexion, abduction, ER with gentle stretching at available end range  Neuromuscular re-ed: Rows 2 x 15 green band  Resisted shoulder extension red band 2 x 10 Therapeutic Activity: Supine shoulder flexion AROM x 10  Table slides abduction x 10  Standing shoulder abduction 2 x 5  Standing shoulder flexion x 5  Pulleys flexion x 2 minutes  Modalities: VASO- game ready Rt shoulder medium compression 34 degrees x 15 mintues     [facility] Adult PT Treatment:                                                DATE: 07/17/24  Neuromuscular re-ed: Prone row 2 x 10  Bilateral ER x 10  Supine serratus punch x 10  Therapeutic Activity: Finger ladder flexion x 5; attempted abduction unable to control upper trap engagement Inclined shoulder flexion AAROM with dowel x 10  Supine shoulder flexion AROM 2 x 5  Seated shoulder abduction short lever 2 x 5  Inclined shoulder flexion AROM 2 x 5  Wall slides flexion x 5  Standing shoulder abduction AROM long lever x 5 Modalities: VASO- game ready Rt shoulder medium compression 34 degrees x 15 mintues     [facility] Adult PT Treatment:                                                DATE: 07/12/24 Therapeutic Exercise: Doorway ER stretch x 5; 10 sec hold Sidelying ER 2 x 5  HEP update  Manual Therapy: Rt shoulder flexion, ER, and abduction PROM with gentle stretching at tolerable end range   Therapeutic Activity: Pulleys flexion/ scaption x 2 min each Sidelying shoulder flexion AROM x 5  Inclined shoulder flexion AAROM hand clasp x 5  Inclined shoulder flexion AAROM physioball x 5  Finger ladder flexion x 5 Modalities: VASO- game ready Rt shoulder medium compression 34 degrees x 15 mintues     [facility] Adult PT Treatment:                                                 DATE: 07/06/24 Therapeutic Exercise: Seated physioball rollout x 10; 5 sec hold  Standing ER AROM x 10  Seated abduction towel slide x 5  HEP update   Neuromuscular re-ed: ER/IR reactive isometrics yellow band x 10 each  Sidelying shoulder abduction x 5 elbow bent, x 5 arm straight  Prone scapular retraction 2 x 10  Therapeutic Activity: Pulleys flexion x 2 minutes  Supine shoulder AAROM with physioball x 5 Wall slide flexion x 5   Modalities: VASO- game ready Rt shoulder medium compression 34 degrees x 15 mintues       PATIENT EDUCATION: Education details: HEP update Person educated: Patient Education method: Explanation, Demonstration, Tactile cues, and Verbal cues, handout  Education comprehension: verbalized understanding, returned demonstration, verbal cues required, tactile cues required, and needs further education  HOME EXERCISE PROGRAM: Access Code: [email or web address] URL: [email or web address] Date: 07/24/2024 Prepared by: Lucie Meeter  Exercises -  Supine Shoulder Flexion AAROM with Hands Clasped  - 1 x daily - 7 x weekly - 1 sets - 10 reps - 5 sec  hold - Seated Shoulder Abduction Towel Slide at Table Top  - 1 x daily - 7 x weekly - 1 sets - 5 reps - 5-10 sec  hold - Standing Shoulder External Rotation AAROM with Dowel  - 1 x daily - 7 x weekly - 1 sets - 10 reps - Standing Shoulder Abduction AAROM with Dowel  - 1 x daily - 7 x weekly - 1 sets - 10 reps - Shoulder Flexion Wall Slide with Towel  - 1 x daily - 7 x weekly - 1 sets - 10 reps - Shoulder External Rotation Reactive Isometrics  - 1 x daily - 7 x weekly - 2 sets - 10 reps - Shoulder Internal Rotation Reactive Isometrics  - 1 x daily - 7 x weekly - 2 sets - 10 reps - Seated 3 Way Exercise Ball Roll Out Stretch  - 1 x daily - 7 x weekly - 1 sets - 10 reps - Sidelying Shoulder Abduction  - 1 x daily - 7 x weekly - 1 sets - 10 reps - Shoulder External Rotation and Scapular  Retraction  - 1 x daily - 7 x weekly - 1 sets - 10 reps - Sidelying Shoulder External Rotation  - 1 x daily - 7 x weekly - 2 sets - 5 reps - Standing Shoulder External Rotation Stretch in Doorway  - 1 x daily - 7 x weekly - 5 sets - 10 sec  hold - Prone Scapular Retraction  - 1 x daily - 7 x weekly - 2 sets - 10 reps - Prone Shoulder Row  - 1 x daily - 7 x weekly - 2 sets - 10 reps - Standing Shoulder Row with Anchored Resistance  - 1 x daily - 3 x weekly - 2 sets - 15 reps - Shoulder extension with resistance - Neutral  - 1 x daily - 3 x weekly - 2 sets - 10 reps - Shoulder Abduction - Thumbs Up  - 1 x daily - 7 x weekly - 2 sets - 5 reps - Standing Shoulder Flexion to 90 Degrees  - 1 x daily - 7 x weekly - 2 sets - 5 reps ASSESSMENT:  CLINICAL IMPRESSION: Patient arrives without reports of pain. Remains limited in shoulder PROM/AROM secondary to pain, but continues to make steady progress with all ROM. Progressed into resisted periscapular strengthening with patient requiring cues for proper trunk/core engagement with ability to correct once cued. Progressed into gravity dependent shoulder AROM with patient able to complete partial range of flexion and abduction AROM without compensatory shrug. She reported soreness at conclusion of session.   EVAL: Patient is a 28 y.o. Rt-handed female who was seen today for physical therapy evaluation and treatment for s/p right shoulder arthroscopy with subacromial decompression, distal clavicle excision, biceps tenodesis, and bankart repair on 06/01/24. She demonstrates RUE ROM and strength deficits that are consistent with recent post-operative status. Patient became nauseous and dizzy following Rt shoulder PROM assessment that was relieved following inclined sitting with ice to the Rt shoulder. She will benefit from skilled PT to address the above stated deficits in order to restore functional use of the RUE and return to professional basketball.      OBJECTIVE IMPAIRMENTS: decreased activity tolerance, decreased endurance, decreased knowledge of condition, decreased ROM, decreased strength, increased edema, increased fascial restrictions,  impaired UE functional use, improper body mechanics, postural dysfunction, and pain.     GOALS: Goals reviewed with patient? Yes  SHORT TERM GOALS: Target date: 07/18/2024    Patient will be independent and compliant with initial HEP.   Baseline: initial HEP issued  Goal status: MET  2.  Patient will demonstrate at least 90 degrees of Rt shoulder flexion AROM to improve ability to complete reaching activity.  Baseline: see PROM above  Goal status: MET  3.  Patient will demonstrate at least 20 degrees of Rt shoulder ER AROM to improve ability to complete self-care activities.  Baseline: see PROM above  Goal status: MET  4. Patient will report pain at worst rated as </=5/10 to improve sleep quality.   Baseline: 11  07/17/24: 7  07/24/24: 4   Goal status: MET   LONG TERM GOALS: Target date: 08/29/2024    Patient will score </= 20 % disability on the QuickDASH (MCID is [id number]) to signify clinically meaningful improvement in functional abilities.   Baseline: see above Goal status: INITIAL  2.  Patient will demonstrate at least 4+/5 Rt shoulder strength in order to shoot a basketball.  Baseline: strength testing deferred Goal status: INITIAL  3.  Patient will demonstrate functional and pain free Rt shoulder AROM necessary to rebound and pass basketball.  Baseline: see PROM above  Goal status: INITIAL  4.  Patient will be independent with advanced home program to progress/maintain current level of function  Baseline: initial HEP issued  Goal status: INITIAL  PLAN: PT FREQUENCY: 2x/week  PT DURATION: 12 weeks  PLANNED INTERVENTIONS: 97164- PT Re-evaluation, 97750- Physical Performance Testing, 97110-Therapeutic exercises, 97530- Therapeutic activity, [id number]- Neuromuscular  re-education, 97535- Self Care, [id number]- Manual therapy, [id number]- Aquatic Therapy, H9716- Electrical stimulation (unattended), [id number]- Electrical stimulation (manual), 97016- Vasopneumatic device, 20560 (1-2 muscles), 20561 (3+ muscles)- Dry Needling, Patient/Family education, Cryotherapy, and Moist heat  PLAN FOR NEXT SESSION: progress per protocol on file. Gameready not covered.   Enza Shone, PT, DPT, ATC 07/24/24 9:45 AM

## 2024-07-26 ENCOUNTER — Ambulatory Visit

## 2024-07-31 ENCOUNTER — Ambulatory Visit

## 2024-08-02 ENCOUNTER — Ambulatory Visit

## 2024-08-02 DIAGNOSIS — R600 Localized edema: Secondary | ICD-10-CM

## 2024-08-02 DIAGNOSIS — M25511 Pain in right shoulder: Secondary | ICD-10-CM | POA: Diagnosis not present

## 2024-08-02 DIAGNOSIS — M6281 Muscle weakness (generalized): Secondary | ICD-10-CM

## 2024-08-02 NOTE — Therapy (Addendum)
 " OUTPATIENT PHYSICAL THERAPY UPPER EXTREMITY TREATMENT  PHYSICAL THERAPY DISCHARGE SUMMARY  Visits from Start of Care: 13  Current functional level related to goals / functional outcomes: See goals below   Remaining deficits: Status unknown   Education / Equipment: N/A   Patient agrees to discharge. Patient goals were partially met. Patient is being discharged due to not returning since the last visit.  Patient Name: Donna Montgomery MRN: [id number] DOB:Jun 08, 1996, 28 y.o., female Today's Date: 08/02/2024  END OF SESSION:  PT End of Session - 08/02/24 1019     Visit Number 13    Number of Visits 24    Date for Recertification  08/29/24    Authorization Type MCD-he[employer] [id number]    Authorization Time Period 9/24-12/23    Authorization - Visit Number 12    Authorization - Number of Visits 13    PT Start Time 1018    PT Stop Time 1100    PT Time Calculation (min) 42 min    Activity Tolerance Patient tolerated treatment well    Behavior During Therapy WFL for tasks assessed/performed                      Past Medical History:  Diagnosis Date   Allergy    Anemia 2014   My blood tests in college said i had low iron and I was given medication. It has been low since.   Anxiety 2014   Had anxiety my whole life, but recognized in college by health professional.   Asthma    Depression 2014   Noticed symptoms in college, was put onto two different medicines. Stopped taking medication and seeked therapy. Symptoms come and go.   Heart murmur 05/08/2019   Ulcer 2013   Bleeding ulcer from H. Bacteria.   Past Surgical History:  Procedure Laterality Date   FRACTURE SURGERY  2017   Hand fracture   OPEN REDUCTION INTERNAL FIXATION (ORIF) HAND Left 2017   POSTERIOR LUMBAR FUSION 2 WITH HARDWARE REMOVAL Right 06/01/2024   Procedure: ARTHROSCOPY, SHOULDER WITH DEBRIDEMENT;  Surgeon: Cristy Bonner DASEN, MD;  Location: [facility];  Service: Orthopedics;  Laterality:  Right;  arthroscopy, shoulder with extensive debridement, Bankart repair, decompression, distal clavicle excision, biceps tenodesis   Patient Active Problem List   Diagnosis Date Noted   Degeneration of lateral meniscus, left 02/25/2024   GAD (generalized anxiety disorder) 08/05/2023   Current moderate episode of major depressive disorder without prior episode (HCC) 08/05/2023   Chronic right shoulder pain 08/05/2023   Acute otitis externa of left ear 08/05/2023   Tinnitus of left ear 08/05/2023   Menorrhagia with regular cycle 10/24/2022   Iron deficiency anemia 10/24/2022   Depression, recurrent 03/25/2020   Anxiety 03/25/2020   Chronic toe pain, right foot 07/27/2017   Asthma 08/29/2010   EXERCISE INDUCED ASTHMA 07/11/2010    PCP: Bevin Bernice RAMAN, DO  REFERRING PROVIDER: Jennye Aleck SAILOR, PA-C  REFERRING DIAG: right shoulder arthroscopy with subacromial decompression, distal clavicle excision, biceps tenodesis, bankart repair   THERAPY DIAG:  Acute pain of right shoulder  Muscle weakness (generalized)  Localized edema  Rationale for Evaluation and Treatment: Rehabilitation  ONSET DATE: 06/01/24  SUBJECTIVE:  SUBJECTIVE STATEMENT: Patient reports the shoulder is feeling ok. It was a little sore last week just with general activity. Had f/u with Dr. Cristy last week who is pleased with her progress thus far. She is scheduled for knee surgery on 08/14/24.    EVAL: Patient underwent Rt shoulder surgery on 9/18. She has f/u with Dr. Cristy on 9/26. She has used all the pain meds and is taking tylenol  as needed for pain. Patient reports the shoulder remains painful described as stabbing and burning. Patient reports sleep is uncomfortable, but remains in her sling at all times except for showering. 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  Hand dominance: Right  PERTINENT HISTORY: None   PAIN:  Are you having pain? Yes: NPRS scale: 2 Pain location: Rt anterior shoulder Pain description:  ache Aggravating factors: reaching Relieving factors: ice, medicine  PRECAUTIONS: Other: see post-op protocol for current restrictions; per patient cleared to begin phase II rehab  RED FLAGS: None   WEIGHT BEARING RESTRICTIONS: Yes NWB RUE  FALLS:  Has patient fallen in last 6 months? No  LIVING ENVIRONMENT: Lives with: lives with their family Lives in: House/apartment Stairs: Yes: Internal: 15 steps; on right going up Has following equipment at home: None  OCCUPATION: Professional basketball player   PLOF: Independent  PATIENT GOALS: To move my arm above 90. To be able to lift like before. Shoot without pain   NEXT MD VISIT: November 2025   OBJECTIVE:  Note: Objective measures were completed at Evaluation unless otherwise noted.  DIAGNOSTIC FINDINGS:  None on file   PATIENT SURVEYS :  Quick DASH: 88.6% disability   COGNITION: Overall cognitive status: Within functional limits for tasks assessed     SENSATION: Not tested  POSTURE: Rounded shoulders   UPPER EXTREMITY ROM:   Passive ROM Right eval Left eval 06/14/24 Right PROM 06/23/24 Right PROM 07/12/24 Right PROM 07/17/24 Right AROM 07/25/23 Right AROM  08/02/24 Right AROM  Shoulder flexion 55   80 125 110 122 123  Shoulder extension          Shoulder abduction 35  45  85  85 91  Shoulder adduction          Shoulder internal rotation          Shoulder external rotation 0  10 0 12 15 28  34  Elbow flexion          Elbow extension          Wrist flexion          Wrist extension          Wrist ulnar deviation          Wrist radial deviation          Wrist pronation          Wrist supination          (Blank rows = not tested)  UPPER EXTREMITY MMT: MMT deferred secondary to  post-op acuity, current restrictions  MMT Right eval Left eval  Shoulder flexion    Shoulder extension    Shoulder abduction    Shoulder adduction    Shoulder internal rotation    Shoulder external rotation    Middle trapezius    Lower trapezius    Elbow flexion    Elbow extension    Wrist flexion    Wrist extension    Wrist ulnar deviation    Wrist radial deviation  Wrist pronation    Wrist supination    Grip strength (lbs)    (Blank rows = not tested) [facility] Adult PT Treatment:                                                DATE: 08/02/24 Therapeutic Exercise: Reviewed and updated HEP discussing ways to progress over the next few weeks prior to upcoming knee surgery Shoulder AROM measurements   Neuromuscular re-ed: Sidelying shoulder abduction x 10 @ 1 lb  Sidelying shoulder ER x 10 @ 1 lb  Bilateral ER yellow band x 10  Resisted shoulder ER/IR yellow band x 10 each Therapeutic Activity: Supine shoulder flexion AAROM with physioball x 10  Supine shoulder flexion AAROM with hand clasp trial  Self Care: Discussed POC given insurance constraints and continued need for PT   [facility] Adult PT Treatment:                                                DATE: 07/24/24 Therapeutic Exercise: Reviewed and updated HEP  Manual Therapy: Rt shoulder PROM to tolerance flexion, abduction, ER with gentle stretching at available end range  Neuromuscular re-ed: Rows 2 x 15 green band  Resisted shoulder extension red band 2 x 10 Therapeutic Activity: Supine shoulder flexion AROM x 10  Table slides abduction x 10  Standing shoulder abduction 2 x 5  Standing shoulder flexion x 5  Pulleys flexion x 2 minutes  Modalities: VASO- game ready Rt shoulder medium compression 34 degrees x 15 mintues     [facility] Adult PT Treatment:                                                DATE: 07/17/24  Neuromuscular re-ed: Prone row 2 x 10  Bilateral ER x 10  Supine serratus punch x 10  Therapeutic  Activity: Finger ladder flexion x 5; attempted abduction unable to control upper trap engagement Inclined shoulder flexion AAROM with dowel x 10  Supine shoulder flexion AROM 2 x 5  Seated shoulder abduction short lever 2 x 5  Inclined shoulder flexion AROM 2 x 5  Wall slides flexion x 5  Standing shoulder abduction AROM long lever x 5 Modalities: VASO- game ready Rt shoulder medium compression 34 degrees x 15 mintues     [facility] Adult PT Treatment:                                                DATE: 07/12/24 Therapeutic Exercise: Doorway ER stretch x 5; 10 sec hold Sidelying ER 2 x 5  HEP update  Manual Therapy: Rt shoulder flexion, ER, and abduction PROM with gentle stretching at tolerable end range   Therapeutic Activity: Pulleys flexion/ scaption x 2 min each Sidelying shoulder flexion AROM x 5  Inclined shoulder flexion AAROM hand clasp x 5  Inclined shoulder flexion AAROM physioball x 5  Finger ladder flexion x 5 Modalities: VASO- game ready  Rt shoulder medium compression 34 degrees x 15 mintues     [facility] Adult PT Treatment:                                                DATE: 07/06/24 Therapeutic Exercise: Seated physioball rollout x 10; 5 sec hold  Standing ER AROM x 10  Seated abduction towel slide x 5  HEP update   Neuromuscular re-ed: ER/IR reactive isometrics yellow band x 10 each  Sidelying shoulder abduction x 5 elbow bent, x 5 arm straight  Prone scapular retraction 2 x 10  Therapeutic Activity: Pulleys flexion x 2 minutes  Supine shoulder AAROM with physioball x 5 Wall slide flexion x 5   Modalities: VASO- game ready Rt shoulder medium compression 34 degrees x 15 mintues       PATIENT EDUCATION: Education details: HEP update Person educated: Patient Education method: Explanation, Demonstration, Tactile cues, and Verbal cues, handout  Education comprehension: verbalized understanding, returned demonstration, verbal cues required, tactile cues  required, and needs further education  HOME EXERCISE PROGRAM: Access Code: [email or web address] URL: [email or web address] Date: 08/02/2024 Prepared by: Lucie Meeter  Exercises - Supine Shoulder Flexion AAROM with Hands Clasped  - 1 x daily - 7 x weekly - 1 sets - 10 reps - 5 sec  hold - Seated Shoulder Abduction Towel Slide at Table Top  - 1 x daily - 7 x weekly - 1 sets - 5 reps - 5-10 sec  hold - Standing Shoulder External Rotation AAROM with Dowel  - 1 x daily - 7 x weekly - 1 sets - 10 reps - Standing Shoulder Abduction AAROM with Dowel  - 1 x daily - 7 x weekly - 1 sets - 10 reps - Shoulder Flexion Wall Slide with Towel  - 1 x daily - 7 x weekly - 1 sets - 10 reps - Seated 3 Way Exercise Ball Roll Out Stretch  - 1 x daily - 7 x weekly - 1 sets - 10 reps - Standing Shoulder External Rotation Stretch in Doorway  - 1 x daily - 7 x weekly - 5 sets - 10 sec  hold - Prone Scapular Retraction  - 1 x daily - 7 x weekly - 2 sets - 10 reps - Shoulder Abduction - Thumbs Up  - 1 x daily - 7 x weekly - 2 sets - 5 reps - Standing Shoulder Flexion to 90 Degrees  - 1 x daily - 7 x weekly - 2 sets - 5 reps - Standing Shoulder Row with Anchored Resistance  - 1 x daily - 3 x weekly - 2 sets - 15 reps - Shoulder extension with resistance - Neutral  - 1 x daily - 3 x weekly - 2 sets - 10 reps - Sidelying Shoulder Abduction  - 1 x daily - 3 x weekly - 2 sets - 10 reps - Sidelying Shoulder External Rotation  - 1 x daily - 3 x weekly - 2 sets - 10 reps - Shoulder External Rotation and Scapular Retraction with Resistance  - 1 x daily - 3 x weekly - 2 sets - 10 reps - Shoulder External Rotation with Anchored Resistance  - 1 x daily - 3 x weekly - 2 sets - 10 reps - Shoulder Internal Rotation with Resistance  - 1 x daily - 3 x weekly - 2  sets - 10 reps ASSESSMENT:  CLINICAL IMPRESSION: Kaylina is making steady progress at nearly 9 weeks post-op. At this time she has exhausted all insurance visits for the  year, so we discussed options for continuing rehabilitation without insurance coverage for her shoulder as well as knowing that she will need rehabilitation following her knee surgery that is scheduled on December 1. We discussed placing PT on hold at our current clinic until 2026 and she is seeking out other community options for PT in the interim. Today we focused on progression of shoulder strengthening introducing light resisted rotator cuff and deltoid strengthening with good tolerance. Lengthy discussion regarding HEP and ways that she can progress over the next few weeks while she finalizes options for other rehabilitation services for the remainder of the year. Patient verbalized understanding of updated HEP and POC at this time.   EVAL: Patient is a 28 y.o. Rt-handed female who was seen today for physical therapy evaluation and treatment for s/p right shoulder arthroscopy with subacromial decompression, distal clavicle excision, biceps tenodesis, and bankart repair on 06/01/24. She demonstrates RUE ROM and strength deficits that are consistent with recent post-operative status. Patient became nauseous and dizzy following Rt shoulder PROM assessment that was relieved following inclined sitting with ice to the Rt shoulder. She will benefit from skilled PT to address the above stated deficits in order to restore functional use of the RUE and return to professional basketball.     OBJECTIVE IMPAIRMENTS: decreased activity tolerance, decreased endurance, decreased knowledge of condition, decreased ROM, decreased strength, increased edema, increased fascial restrictions, impaired UE functional use, improper body mechanics, postural dysfunction, and pain.     GOALS: Goals reviewed with patient? Yes  SHORT TERM GOALS: Target date: 07/18/2024    Patient will be independent and compliant with initial HEP.   Baseline: initial HEP issued  Goal status: MET  2.  Patient will demonstrate at least 90  degrees of Rt shoulder flexion AROM to improve ability to complete reaching activity.  Baseline: see PROM above  Goal status: MET  3.  Patient will demonstrate at least 20 degrees of Rt shoulder ER AROM to improve ability to complete self-care activities.  Baseline: see PROM above  Goal status: MET  4. Patient will report pain at worst rated as </=5/10 to improve sleep quality.   Baseline: 11  07/17/24: 7  07/24/24: 4   Goal status: MET   LONG TERM GOALS: Target date: 08/29/2024    Patient will score </= 20 % disability on the QuickDASH (MCID is 8-15.9) to signify clinically meaningful improvement in functional abilities.   Baseline: see above Goal status: INITIAL  2.  Patient will demonstrate at least 4+/5 Rt shoulder strength in order to shoot a basketball.  Baseline: strength testing deferred Goal status: INITIAL  3.  Patient will demonstrate functional and pain free Rt shoulder AROM necessary to rebound and pass basketball.  Baseline: see PROM above  Goal status: INITIAL  4.  Patient will be independent with advanced home program to progress/maintain current level of function  Baseline: initial HEP issued  Goal status: INITIAL  PLAN: PT FREQUENCY: 2x/week  PT DURATION: 12 weeks  PLANNED INTERVENTIONS: 97164- PT Re-evaluation, 97750- Physical Performance Testing, 97110-Therapeutic exercises, 97530- Therapeutic activity, [id number]- Neuromuscular re-education, 97535- Self Care, 02859- Manual therapy, [id number]- Aquatic Therapy, H9716- Electrical stimulation (unattended), [id number]- Electrical stimulation (manual), [id number]- Vasopneumatic device, 20560 (1-2 muscles), 20561 (3+ muscles)- Dry Needling, Patient/Family education, Cryotherapy, and Moist heat  PLAN FOR NEXT  SESSION: progress per protocol on file. Gameready not covered.   Tashanda Fuhrer, PT, DPT, ATC 08/02/24 12:02 PM  Lucie Meeter, PT, DPT, ATC 09/18/2024 7:56 AM   "

## 2024-08-03 ENCOUNTER — Other Ambulatory Visit: Payer: Self-pay | Admitting: Obstetrics & Gynecology

## 2024-08-09 ENCOUNTER — Ambulatory Visit: Payer: Self-pay

## 2024-08-14 ENCOUNTER — Ambulatory Visit: Payer: Self-pay

## 2024-08-14 NOTE — Progress Notes (Signed)
 Cardiology Office Note   Date:  08/21/2024   ID:  Donna Montgomery, DOB 1995-09-26, MRN [id number]  PCP:  Bevin Bernice RAMAN, DO (Inactive)  Cardiologist:   Welford Christmas, MD   Chief Complaint  Patient presents with   Heart Murmur      History of Present Illness: Donna Montgomery is a 28 y.o. female who is self referred for reevaluation of heart murmur.  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 Communication major.  Was playing in [place] prior to COVID 19 pandemic and during a physical was told she had a murmur. Was asymptomatic at the time.   Work up included ECHO and stress test.  Pt does not have a copy of the records. She states she was told she had a valve that was leaking. Understanding was limited due to language barrier.  she has continued to play basketball professionally most recently in [place]. She had a  sister died in infancy due to a  hole in heart.    She is physically in great condition. Denies any limitation currently in her basketball playing. 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 Reports one episode while playing ball in [place] 5 years ago where she felt out of breath and called for a sub. She then passed out. She recently had shoulder surgery for a torn labrum and biceps tendon. Also planning to have knee surgery. No family history of Marfan's.     Past Medical History:  Diagnosis Date   Allergy    Anemia 2014   My blood tests in college said i had low iron and I was given medication. It has been low since.   Anxiety 2014   Had anxiety my whole life, but recognized in college by health professional.   Asthma    Depression 2014   Noticed symptoms in college, was put onto two different medicines. Stopped taking medication and seeked therapy. Symptoms come and go.   Heart murmur 05/08/2019   Ulcer 2013   Bleeding ulcer from H. Bacteria.    Past Surgical History:  Procedure Laterality Date   FRACTURE SURGERY  2017   Hand fracture    OPEN REDUCTION INTERNAL FIXATION (ORIF) HAND Left 2017   POSTERIOR LUMBAR FUSION 2 WITH HARDWARE REMOVAL Right 06/01/2024   Procedure: ARTHROSCOPY, SHOULDER WITH DEBRIDEMENT;  Surgeon: Cristy Bonner DASEN, MD;  Location: [facility];  Service: Orthopedics;  Laterality: Right;  arthroscopy, shoulder with extensive debridement, Bankart repair, decompression, distal clavicle excision, biceps tenodesis     Current Outpatient Medications  Medication Sig Dispense Refill   benzonatate  (TESSALON ) 100 MG capsule Take 1 capsule (100 mg total) by mouth 3 (three) times daily as needed for cough. 30 capsule 0   etonogestrel -ethinyl estradiol  (NUVARING) 0.12-0.015 MG/24HR vaginal ring INSERT 1 RING VAGINALLY FOR 21 DAYS, THEN REMOVE FOR 7 DAYS 3 each 6   FLUoxetine  (PROZAC ) 20 MG tablet TAKE 1 TABLET BY MOUTH DAILY 90 tablet 0   Iron-Vitamin C 65-125 MG TABS Take by mouth.     Multiple Minerals (JOINT HEALTH MINERAL PO) Take by mouth.     predniSONE  (DELTASONE ) 10 MG tablet Take 10 mg by mouth daily with breakfast.     predniSONE  (STERAPRED UNI-PAK 21 TAB) 10 MG (21) TBPK tablet Take following package directions 21 tablet 0   No current facility-administered medications for this visit.    Allergies:   Bee pollen, Other, Pollen extract, and Cat dander  Social History:  The patient  reports that she has never smoked. She has never used smokeless tobacco. She reports that she does not currently use alcohol. She reports that she does not use drugs.   Family History:  The patient's family history includes ADD / ADHD in her maternal aunt and maternal uncle; Alcohol abuse in her maternal grandfather; Anxiety disorder in her mother; Asthma in an other family member; Drug abuse in her paternal grandfather; Early death in her sister; Miscarriages / Stillbirths in her mother; Stroke in her maternal grandmother; Varicose Veins in her maternal grandmother.    ROS:  Please see the history of present  illness.   Otherwise, review of systems are positive for none.   All other systems are reviewed and negative.    PHYSICAL EXAM: VS:  BP 124/80 (BP Location: Left Arm, Cuff Size: Large)   Pulse 69   Ht 6' 5 (1.956 m)   Wt 162 lb 6.4 oz (73.7 kg)   SpO2 97%   BMI 19.26 kg/m  , BMI Body mass index is 19.26 kg/m. GEN: Well nourished, tall, well developed, in no acute distress  HEENT: normal  Neck: no JVD, carotid bruits, or masses Cardiac: RRR; normal S1 and 2. No gallop. There is a soft 2/6 systolic murmur at the apex and LSB- loudest when patient lying on left side. No click or rub.  Respiratory:  clear to auscultation bilaterally, normal work of breathing GI: soft, nontender, nondistended, + BS MS: no deformity or atrophy  Skin: warm and dry, no rash Neuro:  Strength and sensation are intact Psych: euthymic mood, full affect   EKG Interpretation Date/Time:  Monday August 21 2024 12:05:43 EST Ventricular Rate:  69 PR Interval:  148 QRS Duration:  86 QT Interval:  390 QTC Calculation: 417 R Axis:   86  Text Interpretation: Normal sinus rhythm Nonspecific T wave abnormality Since last tracing HR is slower. Confirmed by Harlene Petralia [phone]) on 08/21/2024 12:21:48 PM      Recent Labs: No results found for requested labs within last 365 days.    Lipid Panel    Component Value Date/Time   CHOL 203 (H) 08/05/2023 1441   TRIG 92 08/05/2023 1441   HDL 77 08/05/2023 1441   CHOLHDL 2.6 08/05/2023 1441   CHOLHDL 2 09/24/2022 1113   VLDL 8.0 09/24/2022 1113   LDLCALC 110 (H) 08/05/2023 1441      Wt Readings from Last 3 Encounters:  08/21/24 162 lb 6.4 oz (73.7 kg)  06/01/24 167 lb 1.7 oz (75.8 kg)  01/17/24 159 lb (72.1 kg)      Other studies Reviewed: Additional studies/ records that were reviewed today include: none   ASSESSMENT AND PLAN:  1.  Heart murmur. 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 It sounds like she was  evaluated with Echo in [place] and was told she could continue to play ball. Normal ETT. Will update Echo now.    Current medicines are reviewed at length with the patient today.  The patient does not have concerns regarding medicines.  The following changes have been made:  no change  Labs/ tests ordered today include:  Orders Placed This Encounter  Procedures   EKG 12-Lead     Disposition:   FU depending on Echo results.   Signed, Dontae Minerva, MD  08/21/2024 12:23 PM    [facility] [address] [place], [place], [address] Phone [phone], Fax [phone]

## 2024-08-16 ENCOUNTER — Telehealth: Admitting: Physician Assistant

## 2024-08-16 ENCOUNTER — Ambulatory Visit: Payer: Self-pay

## 2024-08-16 DIAGNOSIS — J208 Acute bronchitis due to other specified organisms: Secondary | ICD-10-CM

## 2024-08-16 MED ORDER — BENZONATATE 100 MG PO CAPS: 100.0000 mg | ORAL_CAPSULE | Freq: Three times a day (TID) | ORAL | 0 refills | Status: DC | PRN

## 2024-08-16 MED ORDER — PREDNISONE 10 MG (21) PO TBPK: ORAL_TABLET | ORAL | 0 refills | Status: DC

## 2024-08-16 NOTE — Progress Notes (Signed)
 We are sorry that you are not feeling well.  Here is how we plan to help!  Based on your presentation I believe you most likely have A cough due to a virus.  This is called viral bronchitis and is best treated by rest, plenty of fluids and control of the cough.  You may use Ibuprofen  or Tylenol  as directed to help your symptoms.     In addition you may use A prescription cough medication called Tessalon  Perles 100mg . You may take 1-2 capsules every 8 hours as needed for your cough. I have also prescribed a steroid pack to take as directed.  From your responses in the eVisit questionnaire you describe inflammation in the upper respiratory tract which is causing a significant cough.  This is commonly called Bronchitis and has four common causes:   Allergies Viral Infections Acid Reflux Bacterial Infection Allergies, viruses and acid reflux are treated by controlling symptoms or eliminating the cause. An example might be a cough caused by taking certain blood pressure medications. You stop the cough by changing the medication. Another example might be a cough caused by acid reflux. Controlling the reflux helps control the cough.  USE OF BRONCHODILATOR (RESCUE) INHALERS: There is a risk from using your bronchodilator too frequently.  The risk is that over-reliance on a medication which only relaxes the muscles surrounding the breathing tubes can reduce the effectiveness of medications prescribed to reduce swelling and congestion of the tubes themselves.  Although you feel brief relief from the bronchodilator inhaler, your asthma may actually be worsening with the tubes becoming more swollen and filled with mucus.  This can delay other crucial treatments, such as oral steroid medications. If you need to use a bronchodilator inhaler daily, several times per day, you should discuss this with your provider.  There are probably better treatments that could be used to keep your asthma under control.      HOME CARE Only take medications as instructed by your medical team. Complete the entire course of an antibiotic. Drink plenty of fluids and get plenty of rest. Avoid close contacts especially the very young and the elderly Cover your mouth if you cough or cough into your sleeve. Always remember to wash your hands A steam or ultrasonic humidifier can help congestion.   GET HELP RIGHT AWAY IF: You develop worsening fever. You become short of breath You cough up blood. Your symptoms persist after you have completed your treatment plan MAKE SURE YOU  Understand these instructions. Will watch your condition. Will get help right away if you are not doing well or get worse.  Your e-visit answers were reviewed by a board certified advanced clinical practitioner to complete your personal care plan.  Depending on the condition, your plan could have included both over the counter or prescription medications. If there is a problem please reply  once you have received a response from your provider. Your safety is important to us .  If you have drug allergies check your prescription carefully.    You can use MyChart to ask questions about today's visit, request a non-urgent call back, or ask for a work or school excuse for 24 hours related to this e-Visit. If it has been greater than 24 hours you will need to follow up with your provider, or enter a new e-Visit to address those concerns. You will get an e-mail in the next two days asking about your experience.  I hope that your e-visit has been valuable and  will speed your recovery. Thank you for using e-visits.   I have spent 5 minutes in review of e-visit questionnaire, review and updating patient chart, medical decision making and response to patient.   Elsie Velma Lunger, PA-C

## 2024-08-21 ENCOUNTER — Ambulatory Visit: Attending: Cardiology | Admitting: Cardiology

## 2024-08-21 ENCOUNTER — Encounter: Payer: Self-pay | Admitting: Cardiology

## 2024-08-21 VITALS — BP 124/80 | HR 69 | Ht 77.0 in | Wt 162.4 lb

## 2024-08-21 DIAGNOSIS — R011 Cardiac murmur, unspecified: Secondary | ICD-10-CM

## 2024-08-21 NOTE — Patient Instructions (Addendum)
 Medication Instructions:  Continue same medications  Lab Work: None ordered   Testing/Procedures: Echo  first available    Follow-Up: At [facility], you and your health needs are our priority.  As part of our continuing mission to provide you with exceptional heart care, our providers are all part of one team.  This team includes your primary Cardiologist (physician) and Advanced Practice Providers or APPs (Physician Assistants and Nurse Practitioners) who all work together to provide you with the care you need, when you need it.  Your next appointment:  To Be Determined    Provider:  Dr.Jordan    We recommend signing up for the patient portal called MyChart.  Sign up information is provided on this After Visit Summary.  MyChart is used to connect with patients for Virtual Visits (Telemedicine).  Patients are able to view lab/test results, encounter notes, upcoming appointments, etc.  Non-urgent messages can be sent to your provider as well.   To learn more about what you can do with MyChart, go to [email or web address].

## 2024-08-22 ENCOUNTER — Ambulatory Visit: Payer: Self-pay | Admitting: Cardiology

## 2024-08-22 ENCOUNTER — Ambulatory Visit (HOSPITAL_COMMUNITY): Admission: RE | Admit: 2024-08-22 | Discharge: 2024-08-22 | Attending: Cardiology | Admitting: Cardiology

## 2024-08-22 DIAGNOSIS — R011 Cardiac murmur, unspecified: Secondary | ICD-10-CM

## 2024-08-22 LAB — ECHOCARDIOGRAM COMPLETE
Area-P 1/2: 3.63 cm2
MV M vel: 4.61 m/s
MV Peak grad: 85 mmHg
S' Lateral: 3.1 cm

## 2024-09-09 ENCOUNTER — Other Ambulatory Visit: Payer: Self-pay

## 2024-09-09 ENCOUNTER — Ambulatory Visit
Admission: EM | Admit: 2024-09-09 | Discharge: 2024-09-09 | Disposition: A | Discharge status: Home / Self Care | Attending: Family Medicine | Admitting: Family Medicine

## 2024-09-09 DIAGNOSIS — H6505 Acute serous otitis media, recurrent, left ear: Secondary | ICD-10-CM | POA: Diagnosis not present

## 2024-09-09 MED ORDER — FLUTICASONE PROPIONATE 50 MCG/ACT NA SUSP: 2.0000 | Freq: Every day | NASAL | 0 refills | Status: AC

## 2024-09-09 MED ORDER — AMOXICILLIN-POT CLAVULANATE 875-125 MG PO TABS: 1.0000 | ORAL_TABLET | Freq: Two times a day (BID) | ORAL | 0 refills | Status: AC

## 2024-09-09 NOTE — ED Triage Notes (Signed)
 Pt c/o LT ear pain and HA since yesterday. Denies fever. Nasal spray prn.

## 2024-09-09 NOTE — ED Provider Notes (Signed)
 " [facility]    CSN: [id number] Arrival date & time: 09/09/24  0844      History   Chief Complaint Chief Complaint  Patient presents with   Headache   Otalgia    LT    HPI Donna Montgomery is a 28 y.o. female.   HPI  Patient has a headache and left ear pain.  Has been going on just since yesterday.  She states that she is concerned because she does have underlying allergies and has been having some sinus congestion.  Worried about ear infection.  Past Medical History:  Diagnosis Date   Allergy    Anemia 2014   My blood tests in college said i had low iron and I was given medication. It has been low since.   Anxiety 2014   Had anxiety my whole life, but recognized in college by health professional.   Asthma    Depression 2014   Noticed symptoms in college, was put onto two different medicines. Stopped taking medication and seeked therapy. Symptoms come and go.   Heart murmur 05/08/2019   Ulcer 2013   Bleeding ulcer from H. Bacteria.    Patient Active Problem List   Diagnosis Date Noted   Degeneration of lateral meniscus, left 02/25/2024   GAD (generalized anxiety disorder) 08/05/2023   Current moderate episode of major depressive disorder without prior episode (HCC) 08/05/2023   Chronic right shoulder pain 08/05/2023   Acute otitis externa of left ear 08/05/2023   Tinnitus of left ear 08/05/2023   Menorrhagia with regular cycle 10/24/2022   Iron deficiency anemia 10/24/2022   Depression, recurrent 03/25/2020   Anxiety 03/25/2020   Chronic toe pain, right foot 07/27/2017   Asthma 08/29/2010   EXERCISE INDUCED ASTHMA 07/11/2010    Past Surgical History:  Procedure Laterality Date   FRACTURE SURGERY  2017   Hand fracture   OPEN REDUCTION INTERNAL FIXATION (ORIF) HAND Left 2017   POSTERIOR LUMBAR FUSION 2 WITH HARDWARE REMOVAL Right 06/01/2024   Procedure: ARTHROSCOPY, SHOULDER WITH DEBRIDEMENT;  Surgeon: Cristy Bonner DASEN, MD;  Location: [facility];  Service: Orthopedics;  Laterality: Right;  arthroscopy, shoulder with extensive debridement, Bankart repair, decompression, distal clavicle excision, biceps tenodesis    OB History     Gravida  0   Para  0   Term  0   Preterm  0   AB  0   Living  0      SAB  0   IAB  0   Ectopic  0   Multiple  0   Live Births  0            Home Medications    Prior to Admission medications  Medication Sig Start Date End Date Taking? Authorizing Provider  amoxicillin -clavulanate (AUGMENTIN ) 875-125 MG tablet Take 1 tablet by mouth every 12 (twelve) hours. 09/09/24  Yes Maranda Jamee Jacob, MD  fluticasone  (FLONASE ) 50 MCG/ACT nasal spray Place 2 sprays into both nostrils daily. 09/09/24  Yes Maranda Jamee Jacob, MD  etonogestrel -ethinyl estradiol  (NUVARING) 0.12-0.015 MG/24HR vaginal ring INSERT 1 RING VAGINALLY FOR 21 DAYS, THEN REMOVE FOR 7 DAYS 08/14/24   Cris Burnard DEL, MD  FLUoxetine  (PROZAC ) 20 MG tablet TAKE 1 TABLET BY MOUTH DAILY 10/18/23   Wachs, Erika S, DO  Iron-Vitamin C 65-125 MG TABS Take by mouth.    [provider]  Multiple Minerals (JOINT HEALTH MINERAL PO) Take by mouth.    [provider]    Family History Family History  Problem Relation Age of Onset   Anxiety disorder Mother    Miscarriages / Stillbirths Mother    Early death Sister    Asthma Other    Alcohol abuse Maternal Grandfather    Stroke Maternal Grandmother    Varicose Veins Maternal Grandmother    Drug abuse Paternal Grandfather    ADD / ADHD Maternal Aunt    ADD / ADHD Maternal Uncle     Social History Social History[1]   Allergies   Bee pollen, Other, Pollen extract, and Cat dander   Review of Systems Review of Systems  See HPI Physical Exam Triage Vital Signs ED Triage Vitals  Encounter Vitals Group     BP 09/09/24 0859 127/80     Girls Systolic BP Percentile --      Girls Diastolic BP Percentile --      Boys Systolic BP Percentile --       Boys Diastolic BP Percentile --      Pulse Rate 09/09/24 0859 94     Resp 09/09/24 0859 17     Temp 09/09/24 0859 98.6 F (37 C)     Temp Source 09/09/24 0859 Oral     SpO2 09/09/24 0859 96 %     Weight --      Height --      Head Circumference --      Peak Flow --      Pain Score 09/09/24 0901 8     Pain Loc --      Pain Education --      Exclude from Growth Chart --    No data found.  Updated Vital Signs BP 127/80 (BP Location: Right Arm)   Pulse 94   Temp 98.6 F (37 C) (Oral)   Resp 17   LMP  (LMP Unknown)   SpO2 96%      Physical Exam Constitutional:      General: She is not in acute distress.    Appearance: She is well-developed and normal weight.  HENT:     Head: Normocephalic and atraumatic.     Right Ear: Tympanic membrane and ear canal normal.     Ears:     Comments: Left TM is injected and dull    Nose: Rhinorrhea present.     Mouth/Throat:     Mouth: Mucous membranes are moist.     Pharynx: No posterior oropharyngeal erythema.  Eyes:     Conjunctiva/sclera: Conjunctivae normal.     Pupils: Pupils are equal, round, and reactive to light.  Cardiovascular:     Rate and Rhythm: Normal rate.  Pulmonary:     Effort: Pulmonary effort is normal. No respiratory distress.  Abdominal:     General: There is no distension.     Palpations: Abdomen is soft.  Musculoskeletal:        General: Normal range of motion.     Cervical back: Normal range of motion.  Skin:    General: Skin is warm and dry.  Neurological:     Mental Status: She is alert.      UC Treatments / Results  Labs (all labs ordered are listed, but only abnormal results are displayed) Labs Reviewed - No data to display  EKG   Radiology No results found.  Procedures Procedures (including critical care time)  Medications Ordered in UC Medications - No data to display  Initial Impression / Assessment and Plan / UC Course  I have reviewed the triage vital signs and the nursing  notes.  Pertinent labs & imaging results that were available during my care of the patient were reviewed by me and considered in my medical decision making (see chart for details).     Final Clinical Impressions(s) / UC Diagnoses   Final diagnoses:  Recurrent acute serous otitis media of left ear     Discharge Instructions      Take the Augmentin  antibiotic 2 times a day.  Take with food Use the Flonase  twice a day for the first couple days then once a day until your symptoms have resolved Make sure you are drinking lots of water See your primary care doctor if you fail to improve by next week May take Tylenol  or ibuprofen  as needed for pain   ED Prescriptions     Medication Sig Dispense Auth. Provider   amoxicillin -clavulanate (AUGMENTIN ) 875-125 MG tablet Take 1 tablet by mouth every 12 (twelve) hours. 14 tablet Maranda Jamee Jacob, MD   fluticasone  (FLONASE ) 50 MCG/ACT nasal spray Place 2 sprays into both nostrils daily. 16 g Maranda Jamee Jacob, MD      PDMP not reviewed this encounter.    [1]  Social History Tobacco Use   Smoking status: Never   Smokeless tobacco: Never  Vaping Use   Vaping status: Never Used  Substance Use Topics   Alcohol use: Not Currently    Comment: 1-2 glasses of wine per month   Drug use: No     Maranda Jamee Jacob, MD 09/09/24 1645  "

## 2024-09-09 NOTE — Discharge Instructions (Signed)
 Take the Augmentin  antibiotic 2 times a day.  Take with food Use the Flonase  twice a day for the first couple days then once a day until your symptoms have resolved Make sure you are drinking lots of water See your primary care doctor if you fail to improve by next week May take Tylenol  or ibuprofen  as needed for pain

## 2024-09-18 ENCOUNTER — Ambulatory Visit

## 2024-09-18 NOTE — Therapy (Signed)
 " OUTPATIENT PHYSICAL THERAPY UPPER EXTREMITY EVALUATION   Patient Name: Donna Montgomery MRN: [id number] DOB:Feb 19, 1996, 29 y.o., female Today's Date: 09/19/2024  END OF SESSION:  PT End of Session - 09/19/24 0804     Visit Number 1    Number of Visits 17    Date for Recertification  11/18/24    Authorization Type [employer] requesting auth    PT Start Time 0804    PT Stop Time 0842    PT Time Calculation (min) 38 min    Activity Tolerance Patient tolerated treatment well    Behavior During Therapy [facility] for tasks assessed/performed          Past Medical History:  Diagnosis Date   Allergy    Anemia 2014   My blood tests in college said i had low iron and I was given medication. It has been low since.   Anxiety 2014   Had anxiety my whole life, but recognized in college by health professional.   Asthma    Depression 2014   Noticed symptoms in college, was put onto two different medicines. Stopped taking medication and seeked therapy. Symptoms come and go.   Heart murmur 05/08/2019   Ulcer 2013   Bleeding ulcer from H. Bacteria.   Past Surgical History:  Procedure Laterality Date   FRACTURE SURGERY  2017   Hand fracture   OPEN REDUCTION INTERNAL FIXATION (ORIF) HAND Left 2017   POSTERIOR LUMBAR FUSION 2 WITH HARDWARE REMOVAL Right 06/01/2024   Procedure: ARTHROSCOPY, SHOULDER WITH DEBRIDEMENT;  Surgeon: Cristy Bonner DASEN, MD;  Location: [facility];  Service: Orthopedics;  Laterality: Right;  arthroscopy, shoulder with extensive debridement, Bankart repair, decompression, distal clavicle excision, biceps tenodesis   Patient Active Problem List   Diagnosis Date Noted   Degeneration of lateral meniscus, left 02/25/2024   GAD (generalized anxiety disorder) 08/05/2023   Current moderate episode of major depressive disorder without prior episode (HCC) 08/05/2023   Chronic right shoulder pain 08/05/2023   Acute otitis externa of left ear 08/05/2023   Tinnitus of  left ear 08/05/2023   Menorrhagia with regular cycle 10/24/2022   Iron deficiency anemia 10/24/2022   Depression, recurrent 03/25/2020   Anxiety 03/25/2020   Chronic toe pain, right foot 07/27/2017   Asthma 08/29/2010   EXERCISE INDUCED ASTHMA 07/11/2010    PCP: Bevin Bernice RAMAN, DO  REFERRING PROVIDER: Jennye Aleck SAILOR, PA-C  REFERRING DIAG: status post labral repair of the shoulder   THERAPY DIAG:  Stiffness of right shoulder, not elsewhere classified  Muscle weakness (generalized)  Rationale for Evaluation and Treatment: Rehabilitation  ONSET DATE: 06/01/24  SUBJECTIVE:  SUBJECTIVE STATEMENT: For the past month patient has been focusing on light strengthening and ROM activity s/p Rt SAD, DCE, bicep tenodesis, and bankart repair on 06/01/24. She reports she has not been experiencing pain. Still has difficulty with overhead activity and reaching. 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 She also has plans to undergo Lt knee surgery on 10/04/24.  Hand dominance: Right  PERTINENT HISTORY: s/p Rt SAD, DCE, bicep tenodesis, and bankart repair on 06/01/24.   PAIN:  Are you having pain? Yes: NPRS scale: none currently; 3 at worst Pain location: Rt shoulder Pain description: deep ache Aggravating factors: unknown Relieving factors: rest, tylenol    PRECAUTIONS: Other: see protocol on file   RED FLAGS: None   WEIGHT BEARING RESTRICTIONS: advanced RUE weightbearing to tolerance  FALLS:  Has patient fallen in last 6 months? No  LIVING ENVIRONMENT: Lives with: lives with their family Lives in: House/apartment   OCCUPATION: Professional basketball player  PLOF: Independent  PATIENT GOALS: shoot without pain, lifting heavy weights  like I was before, get full ROM.   NEXT MD VISIT: 10/04/24  OBJECTIVE:  Note: Objective measures were completed at Evaluation unless otherwise noted.  DIAGNOSTIC FINDINGS:  None on file   PATIENT SURVEYS :  Quick DASH: 25% disability   COGNITION: Overall cognitive status: Within functional limits for tasks assessed     SENSATION: Not tested  POSTURE: Rounded shoulders   UPPER EXTREMITY ROM:   Active ROM Right eval Left eval  Shoulder flexion 130 standing    Shoulder extension    Shoulder abduction 128 standing    Shoulder adduction    Shoulder internal rotation 71   Shoulder external rotation 50   Elbow flexion    Elbow extension    Wrist flexion    Wrist extension    Wrist ulnar deviation    Wrist radial deviation    Wrist pronation    Wrist supination    (Blank rows = not tested)  UPPER EXTREMITY MMT:  MMT Right eval Left eval  Shoulder flexion 4   Shoulder extension    Shoulder abduction 5   Shoulder adduction    Shoulder internal rotation 5   Shoulder external rotation 4   Middle trapezius    Lower trapezius    Elbow flexion 4+   Elbow extension 4   Wrist flexion    Wrist extension    Wrist ulnar deviation    Wrist radial deviation    Wrist pronation    Wrist supination    Grip strength (lbs)    (Blank rows = not tested)     PALPATION:  Not assessed                                                                                                                              [facility] Adult PT Treatment:  DATE: 09/18/24 Therapeutic Exercise: Demonstrated,performed, and issued initial HEP.   Manual Therapy: Rt shoulder PROM flexion, abduction, ER, IR with gentle end range stretching      PATIENT EDUCATION: Education details: see treatment; POC Person educated: Patient Education method: Explanation, Demonstration, Tactile cues, Verbal cues, and Handouts Education comprehension: verbalized  understanding, returned demonstration, verbal cues required, tactile cues required, and needs further education  HOME EXERCISE PROGRAM: Access Code: [email or web address] URL: [email or web address] Date: 09/19/2024 Prepared by: Lucie Meeter  Exercises - Shoulder Flexion Wall Slide with Towel  - 1 x daily - 7 x weekly - 1 sets - 10 reps - 5 sec  hold - Standing Shoulder Abduction Slides at Wall  - 1 x daily - 7 x weekly - 1 sets - 10 reps - 5 sec  hold - Supine Shoulder Flexion with Dowel  - 1 x daily - 7 x weekly - 1 sets - 10 reps - 10 sec  hold - Standing Shoulder External Rotation Stretch in Doorway  - 1 x daily - 7 x weekly - 3 sets - 20 sec  hold - Shoulder External Rotation with Anchored Resistance  - 1 x daily - 3 x weekly - 3 sets - 10 reps - Standing Bent Over Triceps Extension  - 1 x daily - 3 x weekly - 3 sets - 10 reps - Standing Bicep Curls Supinated with Dumbbells  - 1 x daily - 3 x weekly - 3 sets - 10 reps - Standing Shoulder Horizontal Abduction with Resistance  - 1 x daily - 3 x weekly - 3 sets - 10 reps - Shoulder Taps on Table  - 1 x daily - 3 x weekly - 3 sets - 10 reps  ASSESSMENT:  CLINICAL IMPRESSION: Patient is a 29 y.o. female who was seen today for physical therapy evaluation and treatment for s/p Rt SAD, DCE, bicep tenodesis, and bankart repair on 06/01/24. She has been completing exercises independently over the past month secondary to insurance authorization constraints at the end of the year. 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    OBJECTIVE IMPAIRMENTS: decreased activity tolerance, decreased endurance, decreased knowledge of condition,  decreased ROM, decreased strength, impaired flexibility, impaired UE functional use, postural dysfunction, and pain.   ACTIVITY LIMITATIONS: carrying, lifting, bathing, reach over head, hygiene/grooming, and caring for others  PARTICIPATION LIMITATIONS: cleaning, laundry, community activity, occupation, and recreational activity  PERSONAL FACTORS: Age, Profession, Time since onset of injury/illness/exacerbation, and 3+ comorbidities: see PMH above are also affecting patient's functional outcome.   REHAB POTENTIAL: Good  CLINICAL DECISION MAKING: Stable/uncomplicated  EVALUATION COMPLEXITY: Low  GOALS: Goals reviewed with patient? Yes  SHORT TERM GOALS: Target date: 10/17/2024    Patient will be independent and compliant with initial HEP.   Baseline: initial HEP issued  Goal status: INITIAL  2.  Patient will demonstrate at least 160 degrees of Rt shoulder flexion AROM to improve ability to block in basketball.  Baseline: see above Goal status: INITIAL  3.  Patient will demonstrate at least 160 degrees of Rt shoulder abduction AROM to improve ability to rebound basketball.  Baseline: see above Goal status: INITIAL  4.  Patient will demonstrate at  least 65 degrees of Rt shoulder ER AROM to improve ability to complete self-care activities.  Baseline: see above Goal status: INITIAL    LONG TERM GOALS: Target date: 11/18/24  Patient will score </= 10 % disability on the QuickDASH (MCID is 8-15.9) to signify clinically meaningful improvement in functional abilities.   Baseline: 25 Goal status: INITIAL  2.  Patient will demonstrate 5/5 Rt shoulder strength to improve stability with lifting activity.  Baseline: see above Goal status: INITIAL  3.  Patient will be able to shoot 20 ft jump shot and layup without pain.  Baseline: unable  Goal status: INITIAL  4.  Patient will be independent with advanced home program to progress/maintain current level of function.  Baseline:  initial HEP issued  Goal status: INITIAL   PLAN: PT FREQUENCY: 2x/week  PT DURATION: 8 weeks  PLANNED INTERVENTIONS: 97164- PT Re-evaluation, 97750- Physical Performance Testing, 97110-Therapeutic exercises, 97530- Therapeutic activity, [id number]- Neuromuscular re-education, 97535- Self Care, [id number]- Manual therapy, 97016- Vasopneumatic device, 79439 (1-2 muscles), 20561 (3+ muscles)- Dry Needling, Patient/Family education, Cryotherapy, and Moist heat  PLAN FOR NEXT SESSION: PROM Rt shoulder, work on restoring full range. Strength progression to tolerance   Lucie Meeter, PT, DPT, ATC 09/19/2024 8:55 AM  For all possible CPT codes, reference the Planned Interventions line above.     Check all conditions that are expected to impact treatment: {Conditions expected to impact treatment:Psychological or psychiatric disorders   If treatment provided at initial evaluation, no treatment charged due to lack of authorization.       "

## 2024-09-19 ENCOUNTER — Other Ambulatory Visit: Payer: Self-pay

## 2024-09-19 ENCOUNTER — Ambulatory Visit: Attending: Sports Medicine

## 2024-09-19 DIAGNOSIS — R600 Localized edema: Secondary | ICD-10-CM | POA: Diagnosis present

## 2024-09-19 DIAGNOSIS — M25611 Stiffness of right shoulder, not elsewhere classified: Secondary | ICD-10-CM | POA: Diagnosis present

## 2024-09-19 DIAGNOSIS — M25562 Pain in left knee: Secondary | ICD-10-CM | POA: Diagnosis present

## 2024-09-19 DIAGNOSIS — M6281 Muscle weakness (generalized): Secondary | ICD-10-CM | POA: Insufficient documentation

## 2024-09-19 DIAGNOSIS — R2689 Other abnormalities of gait and mobility: Secondary | ICD-10-CM | POA: Insufficient documentation

## 2024-09-26 ENCOUNTER — Ambulatory Visit

## 2024-09-26 DIAGNOSIS — M6281 Muscle weakness (generalized): Secondary | ICD-10-CM

## 2024-09-26 DIAGNOSIS — M25611 Stiffness of right shoulder, not elsewhere classified: Secondary | ICD-10-CM | POA: Diagnosis not present

## 2024-09-26 NOTE — Therapy (Signed)
 " OUTPATIENT PHYSICAL THERAPY UPPER EXTREMITY TREATMENT   Patient Name: Donna Montgomery MRN: [id number] DOB:21-May-1996, 29 y.o., female Today's Date: 09/26/2024  END OF SESSION:  PT End of Session - 09/26/24 1336     Visit Number 2    Number of Visits 17    Date for Recertification  11/18/24    Authorization Type [employer]    Authorization Time Period 1/6-11/17/24    Authorization - Visit Number 1    Authorization - Number of Visits 6    PT Start Time 1336    PT Stop Time 1434    PT Time Calculation (min) 58 min    Activity Tolerance Patient tolerated treatment well    Behavior During Therapy WFL for tasks assessed/performed           Past Medical History:  Diagnosis Date   Allergy    Anemia 2014   My blood tests in college said i had low iron and I was given medication. It has been low since.   Anxiety 2014   Had anxiety my whole life, but recognized in college by health professional.   Asthma    Depression 2014   Noticed symptoms in college, was put onto two different medicines. Stopped taking medication and seeked therapy. Symptoms come and go.   Heart murmur 05/08/2019   Ulcer 2013   Bleeding ulcer from H. Bacteria.   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 episode (HCC) 08/05/2023   Chronic right shoulder pain 08/05/2023   Acute otitis externa of left ear 08/05/2023   Tinnitus of left ear 08/05/2023   Menorrhagia with regular cycle 10/24/2022   Iron deficiency anemia 10/24/2022   Depression, recurrent 03/25/2020   Anxiety 03/25/2020   Chronic toe pain, right foot 07/27/2017   Asthma 08/29/2010   EXERCISE INDUCED ASTHMA  07/11/2010    PCP: Bevin Bernice RAMAN, DO  REFERRING PROVIDER: Jennye Aleck SAILOR, PA-C  REFERRING DIAG: status post labral repair of the shoulder   THERAPY DIAG:  Stiffness of right shoulder, not elsewhere classified  Muscle weakness (generalized)  Rationale for Evaluation and Treatment: Rehabilitation  ONSET DATE: 06/01/24  SUBJECTIVE:  SUBJECTIVE STATEMENT: Patient reports the shoulder is feeling good without pain. Resisted ER is challenging as part of HEP.   EVAL: For the past month patient has been focusing on light strengthening and ROM activity s/p Rt SAD, DCE, bicep tenodesis, and bankart repair on 06/01/24. She reports she has not been experiencing pain. Still has difficulty with overhead activity and reaching. 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 She also has plans to undergo Lt knee surgery on 10/04/24.  Hand dominance: Right  PERTINENT HISTORY: s/p Rt SAD, DCE, bicep tenodesis, and bankart repair on 06/01/24.   PAIN:  Are you having pain? No   PRECAUTIONS: Other: see protocol on file   RED FLAGS: None   WEIGHT BEARING RESTRICTIONS: advanced RUE weightbearing to tolerance  FALLS:  Has patient fallen in last 6 months? No  LIVING ENVIRONMENT: Lives with: lives with their family Lives in: House/apartment   OCCUPATION: Professional basketball player  PLOF: Independent  PATIENT GOALS: shoot without pain, lifting heavy weights like I was before, get full ROM.   NEXT MD VISIT: 10/04/24  OBJECTIVE:  Note: Objective measures were completed at Evaluation unless otherwise noted.  DIAGNOSTIC FINDINGS:  None on file   PATIENT SURVEYS :  Quick DASH: 25% disability   COGNITION: Overall cognitive status: Within functional  limits for tasks assessed     SENSATION: Not tested  POSTURE: Rounded shoulders   UPPER EXTREMITY ROM:   Active ROM Right eval Left eval  Shoulder flexion 130 standing    Shoulder extension    Shoulder abduction 128 standing    Shoulder adduction    Shoulder internal rotation 71   Shoulder external rotation 50   Elbow flexion    Elbow extension    Wrist flexion    Wrist extension    Wrist ulnar deviation    Wrist radial deviation    Wrist pronation    Wrist supination    (Blank rows = not tested)  UPPER EXTREMITY MMT:  MMT Right eval Left eval  Shoulder flexion 4   Shoulder extension    Shoulder abduction 5   Shoulder adduction    Shoulder internal rotation 5   Shoulder external rotation 4   Middle trapezius    Lower trapezius    Elbow flexion 4+   Elbow extension 4   Wrist flexion    Wrist extension    Wrist ulnar deviation    Wrist radial deviation    Wrist pronation    Wrist supination    Grip strength (lbs)    (Blank rows = not tested)     PALPATION:  Not assessed    [facility] Adult PT Treatment:                                                DATE: 09/26/24  Manual Therapy: STM/trigger point release Rt rhomboids, use of theracane and tennis ball for self-soft tissue mobilization  Rt shoulder PROM flexion, ER, IR, abduction Neuromuscular re-ed: Resisted rows 25 lbs 2 x 15  Sidelying ER 2 x 15; @ 2 lbs then @ 3 lbs  Quadruped arm reach 2 x 10  Wall ball circles CW/CCW flexion 2 x 20 each  Therapeutic Activity: Pulleys flexion/scaption x 2 minutes  Finger ladder flexion/abduction x 5 each  Standing shoulder flexion 2  lbs 2 x 5 Standing shoulder abduction 2 lbs 2 x 5                                                                                                                 [facility] Adult PT Treatment:                                                DATE: 09/18/24 Therapeutic Exercise: Demonstrated,performed, and issued initial HEP.   Manual  Therapy: Rt shoulder PROM flexion, abduction, ER, IR with gentle end range stretching      PATIENT EDUCATION: Education details: HEP update Person educated: Patient Education method: Explanation, Demonstration, Tactile cues, Verbal cues, and Handouts Education comprehension: verbalized understanding, returned demonstration, verbal cues required, tactile cues required, and needs further education  HOME EXERCISE PROGRAM: Access Code: [email or web address] URL: [email or web address] Date: 09/26/2024 Prepared by: Lucie Meeter  Exercises - Shoulder Flexion Wall Slide with Towel  - 1 x daily - 7 x weekly - 1 sets - 10 reps - 5 sec  hold - Standing Shoulder Abduction Slides at Wall  - 1 x daily - 7 x weekly - 1 sets - 10 reps - 5 sec  hold - Standing Shoulder External Rotation Stretch in Doorway  - 1 x daily - 7 x weekly - 3 sets - 20 sec  hold - Standing Shoulder Row with Anchored Resistance  - 1 x daily - 7 x weekly - 3 sets - 10 reps - Quadruped Alternating Arm Lift  - 1 x daily - 7 x weekly - 2 sets - 10 reps - Supine Shoulder Flexion with Dowel  - 1 x daily - 7 x weekly - 1 sets - 10 reps - 10 sec  hold - Shoulder Abduction with Dumbbells - Thumbs Up  - 1 x daily - 7 x weekly - 2 sets - 5 reps - Standing Shoulder Flexion to 90 Degrees with Dumbbells  - 1 x daily - 7 x weekly - 2 sets - 5 reps - Shoulder External Rotation with Anchored Resistance  - 1 x daily - 3 x weekly - 3 sets - 10 reps - Standing Bent Over Triceps Extension  - 1 x daily - 3 x weekly - 3 sets - 10 reps - Standing Bicep Curls Supinated with Dumbbells  - 1 x daily - 3 x weekly - 3 sets - 10 reps - Standing Shoulder Horizontal Abduction with Resistance  - 1 x daily - 3 x weekly - 3 sets - 10 reps - Shoulder Taps on Table  - 1 x daily - 3 x weekly - 3 sets - 10 reps - Sidelying Shoulder ER with Towel and Dumbbell  - 1 x daily - 3 x weekly - 3 sets - 10 reps - Standing Wall [employer] with Mini Swiss Ball  - 1 x  daily - 3 x  weekly - 2 sets - 20 reps  ASSESSMENT:  CLINICAL IMPRESSION: With PROM patient began experiencing pain in the Rt rhomboid during abduction motion. This pain was alleviated with soft tissue work to address active trigger point. Focused on strength progression today within post-op protocol with good tolerance. Fatigues quickly with light resisted shoulder flexion and abduction, but is able to maintain good form and control through partial range. Occasional postural cues required to reduce rounded shoulders with strengthening exercises. No reports of shoulder pain throughout session.   EVAL: Patient is a 29 y.o. female who was seen today for physical therapy evaluation and treatment for s/p Rt SAD, DCE, bicep tenodesis, and bankart repair on 06/01/24. She has been completing exercises independently over the past month secondary to insurance authorization constraints at the end of the year. 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    OBJECTIVE IMPAIRMENTS: decreased activity tolerance, decreased endurance, decreased knowledge of condition, decreased ROM, decreased strength, impaired flexibility, impaired UE functional use, postural dysfunction, and pain.   ACTIVITY LIMITATIONS: carrying, lifting, bathing, reach over head, hygiene/grooming, and caring for others  PARTICIPATION LIMITATIONS: cleaning, laundry, community activity, occupation, and recreational activity  PERSONAL FACTORS: Age, Profession, Time since onset of injury/illness/exacerbation, and 3+ comorbidities: see PMH above are also affecting patient's functional outcome.   REHAB POTENTIAL: Good  CLINICAL  DECISION MAKING: Stable/uncomplicated  EVALUATION COMPLEXITY: Low  GOALS: Goals reviewed with patient? Yes  SHORT TERM GOALS: Target date: 10/17/2024    Patient will be independent and compliant with initial HEP.   Baseline: initial HEP issued  Goal status: INITIAL  2.  Patient will demonstrate at least 160 degrees of Rt shoulder flexion AROM to improve ability to block in basketball.  Baseline: see above Goal status: INITIAL  3.  Patient will demonstrate at least 160 degrees of Rt shoulder abduction AROM to improve ability to rebound basketball.  Baseline: see above Goal status: INITIAL  4.  Patient will demonstrate at least 65 degrees of Rt shoulder ER AROM to improve ability to complete self-care activities.  Baseline: see above Goal status: INITIAL    LONG TERM GOALS: Target date: 11/18/24  Patient will score </= 10 % disability on the QuickDASH (MCID is 8-15.9) to signify clinically meaningful improvement in functional abilities.   Baseline: 25 Goal status: INITIAL  2.  Patient will demonstrate 5/5 Rt shoulder strength to improve stability with lifting activity.  Baseline: see above Goal status: INITIAL  3.  Patient will be able to shoot 20 ft jump shot and layup without pain.  Baseline: unable  Goal status: INITIAL  4.  Patient will be independent with advanced home program to progress/maintain current level of function.  Baseline: initial HEP issued  Goal status: INITIAL   PLAN: PT FREQUENCY: 2x/week  PT DURATION: 8 weeks  PLANNED INTERVENTIONS: 97164- PT Re-evaluation, 97750- Physical Performance Testing, 97110-Therapeutic exercises, 97530- Therapeutic activity, [id number]- Neuromuscular re-education, 97535- Self Care, [id number]- Manual therapy, 97016- Vasopneumatic device, 79439 (1-2 muscles), 20561 (3+ muscles)- Dry Needling, Patient/Family education, Cryotherapy, and Moist heat  PLAN FOR NEXT SESSION: PROM Rt shoulder, work on restoring full range. Strength  progression to tolerance   Lucie Meeter, PT, DPT, ATC  09/26/2024 2:38 PM "

## 2024-10-03 ENCOUNTER — Ambulatory Visit

## 2024-10-03 DIAGNOSIS — M6281 Muscle weakness (generalized): Secondary | ICD-10-CM

## 2024-10-03 DIAGNOSIS — M25611 Stiffness of right shoulder, not elsewhere classified: Secondary | ICD-10-CM

## 2024-10-03 NOTE — Therapy (Signed)
 " OUTPATIENT PHYSICAL THERAPY UPPER EXTREMITY TREATMENT   Patient Name: Donna Montgomery MRN: [id number] DOB:03-13-1996, 29 y.o., female Today's Date: 10/03/2024  END OF SESSION:  PT End of Session - 10/03/24 1449     Visit Number 3    Number of Visits 17    Date for Recertification  11/18/24    Authorization Type [employer]    Authorization Time Period 1/6-11/17/24    Authorization - Visit Number 2    Authorization - Number of Visits 6    PT Start Time 1449    PT Stop Time 1540    PT Time Calculation (min) 51 min    Activity Tolerance Patient tolerated treatment well    Behavior During Therapy [facility] for tasks assessed/performed            Past Medical History:  Diagnosis Date   Allergy    Anemia 2014   My blood tests in college said i had low iron and I was given medication. It has been low since.   Anxiety 2014   Had anxiety my whole life, but recognized in college by health professional.   Asthma    Depression 2014   Noticed symptoms in college, was put onto two different medicines. Stopped taking medication and seeked therapy. Symptoms come and go.   Heart murmur 05/08/2019   Ulcer 2013   Bleeding ulcer from H. Bacteria.   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 episode (HCC) 08/05/2023   Chronic right shoulder pain 08/05/2023   Acute otitis externa of left ear 08/05/2023   Tinnitus of left ear 08/05/2023   Menorrhagia with regular cycle 10/24/2022   Iron deficiency anemia 10/24/2022   Depression, recurrent 03/25/2020   Anxiety 03/25/2020   Chronic toe pain, right foot 07/27/2017   Asthma 08/29/2010   EXERCISE INDUCED ASTHMA  07/11/2010    PCP: Bevin Bernice RAMAN, DO  REFERRING PROVIDER: Jennye Aleck SAILOR, PA-C  REFERRING DIAG: status post labral repair of the shoulder   THERAPY DIAG:  Stiffness of right shoulder, not elsewhere classified  Muscle weakness (generalized)  Rationale for Evaluation and Treatment: Rehabilitation  ONSET DATE: 06/01/24  SUBJECTIVE:  SUBJECTIVE STATEMENT: Shoulder is feeling good. Has knee surgery scheduled for tomorrow.   EVAL: For the past month patient has been focusing on light strengthening and ROM activity s/p Rt SAD, DCE, bicep tenodesis, and bankart repair on 06/01/24. She reports she has not been experiencing pain. Still has difficulty with overhead activity and reaching. 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 She also has plans to undergo Lt knee surgery on 10/04/24.  Hand dominance: Right  PERTINENT HISTORY: s/p Rt SAD, DCE, bicep tenodesis, and bankart repair on 06/01/24.   PAIN:  Are you having pain? No   PRECAUTIONS: Other: see protocol on file   RED FLAGS: None   WEIGHT BEARING RESTRICTIONS: advanced RUE weightbearing to tolerance  FALLS:  Has patient fallen in last 6 months? No  LIVING ENVIRONMENT: Lives with: lives with their family Lives in: House/apartment   OCCUPATION: Professional basketball player  PLOF: Independent  PATIENT GOALS: shoot without pain, lifting heavy weights like I was before, get full ROM.   NEXT MD VISIT: 10/04/24  OBJECTIVE:  Note: Objective measures were completed at Evaluation unless otherwise noted.  DIAGNOSTIC FINDINGS:  None on file   PATIENT SURVEYS :  Quick DASH: 25% disability   COGNITION: Overall cognitive status: Within functional limits for tasks  assessed     SENSATION: Not tested  POSTURE: Rounded shoulders   UPPER EXTREMITY ROM:   Active ROM Right eval Left eval 10/03/24 Right AROM  Shoulder flexion 130 standing   152 standing  Shoulder extension     Shoulder abduction 128 standing   150 standing   Shoulder adduction     Shoulder internal rotation 71    Shoulder external rotation 50    Elbow flexion     Elbow extension     Wrist flexion     Wrist extension     Wrist ulnar deviation     Wrist radial deviation     Wrist pronation     Wrist supination     (Blank rows = not tested)  UPPER EXTREMITY MMT:  MMT Right eval Left eval  Shoulder flexion 4   Shoulder extension    Shoulder abduction 5   Shoulder adduction    Shoulder internal rotation 5   Shoulder external rotation 4   Middle trapezius    Lower trapezius    Elbow flexion 4+   Elbow extension 4   Wrist flexion    Wrist extension    Wrist ulnar deviation    Wrist radial deviation    Wrist pronation    Wrist supination    Grip strength (lbs)    (Blank rows = not tested)     PALPATION:  Not assessed  [facility] Adult PT Treatment:                                                DATE: 10/03/24  Manual Therapy: Rt shoulder PROM flexion, abduction, ER, IR to tolerance  Neuromuscular re-ed: Bodyblade 90 degrees flexion 2 x 20 sec each neutral and pronated  Resisted shoulder adduction 2 x 10  90/90 resisted IR red band 2 x 10  90/90 resisted ER red band 2 x 10  Wall ball 1/2 circle taps red med ball 2 x 5  Wall C's red band 2 x 5  Therapeutic  Activity: Pulleys flexion x 2 minutes     [facility] Adult PT Treatment:                                                DATE: 09/26/24  Manual Therapy: STM/trigger point release Rt rhomboids, use of theracane and tennis ball for self-soft tissue mobilization  Rt shoulder PROM flexion, ER, IR, abduction Neuromuscular re-ed: Resisted rows 25 lbs 2 x 15  Sidelying ER 2 x 15; @ 2 lbs then @ 3 lbs  Quadruped arm  reach 2 x 10  Wall ball circles CW/CCW flexion 2 x 20 each  Therapeutic Activity: Pulleys flexion/scaption x 2 minutes  Finger ladder flexion/abduction x 5 each  Standing shoulder flexion 2 lbs 2 x 5 Standing shoulder abduction 2 lbs 2 x 5                                                                                                                 [facility] Adult PT Treatment:                                                DATE: 09/18/24 Therapeutic Exercise: Demonstrated,performed, and issued initial HEP.   Manual Therapy: Rt shoulder PROM flexion, abduction, ER, IR with gentle end range stretching      PATIENT EDUCATION: Education details: HEP review Person educated: Patient Education method: Explanation, Demonstration, Tactile cues, and Verbal cues Education comprehension: verbalized understanding, returned demonstration, verbal cues required, tactile cues required, and needs further education  HOME EXERCISE PROGRAM: Access Code: [email or web address] URL: [email or web address] Date: 09/26/2024 Prepared by: Lucie Meeter  Exercises - Shoulder Flexion Wall Slide with Towel  - 1 x daily - 7 x weekly - 1 sets - 10 reps - 5 sec  hold - Standing Shoulder Abduction Slides at Wall  - 1 x daily - 7 x weekly - 1 sets - 10 reps - 5 sec  hold - Standing Shoulder External Rotation Stretch in Doorway  - 1 x daily - 7 x weekly - 3 sets - 20 sec  hold - Standing Shoulder Row with Anchored Resistance  - 1 x daily - 7 x weekly - 3 sets - 10 reps - Quadruped Alternating Arm Lift  - 1 x daily - 7 x weekly - 2 sets - 10 reps - Supine Shoulder Flexion with Dowel  - 1 x daily - 7 x weekly - 1 sets - 10 reps - 10 sec  hold - Shoulder Abduction with Dumbbells - Thumbs Up  - 1 x daily - 7 x weekly - 2 sets - 5 reps - Standing Shoulder Flexion to 90 Degrees with Dumbbells  - 1 x daily - 7 x weekly - 2 sets - 5 reps -  Shoulder External Rotation with Anchored Resistance  - 1 x daily - 3 x weekly - 3 sets  - 10 reps - Standing Bent Over Triceps Extension  - 1 x daily - 3 x weekly - 3 sets - 10 reps - Standing Bicep Curls Supinated with Dumbbells  - 1 x daily - 3 x weekly - 3 sets - 10 reps - Standing Shoulder Horizontal Abduction with Resistance  - 1 x daily - 3 x weekly - 3 sets - 10 reps - Shoulder Taps on Table  - 1 x daily - 3 x weekly - 3 sets - 10 reps - Sidelying Shoulder ER with Towel and Dumbbell  - 1 x daily - 3 x weekly - 3 sets - 10 reps - Standing Wall [employer] with Mini Swiss Ball  - 1 x daily - 3 x weekly - 2 sets - 20 reps  ASSESSMENT:  CLINICAL IMPRESSION: Shoulder flexion and abduction AROM have improved compared to initial evaluation, nearing these short term functional goals. Continued with shoulder strengthening with patient fatiguing with 90/90 rotator cuff strengthening, but no reports of pain. Requires occasional cues to reduce rounded shoulders with strengthening.   EVAL: Patient is a 29 y.o. female who was seen today for physical therapy evaluation and treatment for s/p Rt SAD, DCE, bicep tenodesis, and bankart repair on 06/01/24. She has been completing exercises independently over the past month secondary to insurance authorization constraints at the end of the year. 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    OBJECTIVE IMPAIRMENTS: decreased activity tolerance, decreased endurance, decreased knowledge of condition, decreased ROM, decreased strength, impaired flexibility, impaired UE functional use, postural dysfunction, and pain.   ACTIVITY LIMITATIONS: carrying, lifting, bathing, reach over head, hygiene/grooming, and caring for  others  PARTICIPATION LIMITATIONS: cleaning, laundry, community activity, occupation, and recreational activity  PERSONAL FACTORS: Age, Profession, Time since onset of injury/illness/exacerbation, and 3+ comorbidities: see PMH above are also affecting patient's functional outcome.   REHAB POTENTIAL: Good  CLINICAL DECISION MAKING: Stable/uncomplicated  EVALUATION COMPLEXITY: Low  GOALS: Goals reviewed with patient? Yes  SHORT TERM GOALS: Target date: 10/17/2024    Patient will be independent and compliant with initial HEP.   Baseline: initial HEP issued  Goal status: MET  2.  Patient will demonstrate at least 160 degrees of Rt shoulder flexion AROM to improve ability to block in basketball.  Baseline: see above Goal status: progressing   3.  Patient will demonstrate at least 160 degrees of Rt shoulder abduction AROM to improve ability to rebound basketball.  Baseline: see above Goal status: progressing   4.  Patient will demonstrate at least 65 degrees of Rt shoulder ER AROM to improve ability to complete self-care activities.  Baseline: see above Goal status: INITIAL    LONG TERM GOALS: Target date: 11/18/24  Patient will score </= 10 % disability on the QuickDASH (MCID is 8-15.9) to signify clinically meaningful improvement in functional abilities.   Baseline: 25 Goal status: INITIAL  2.  Patient will demonstrate 5/5 Rt shoulder strength to improve stability with lifting activity.  Baseline: see above Goal status: INITIAL  3.  Patient will be able to shoot 20 ft  jump shot and layup without pain.  Baseline: unable  Goal status: INITIAL  4.  Patient will be independent with advanced home program to progress/maintain current level of function.  Baseline: initial HEP issued  Goal status: INITIAL   PLAN: PT FREQUENCY: 2x/week  PT DURATION: 8 weeks  PLANNED INTERVENTIONS: 97164- PT Re-evaluation, 97750- Physical Performance Testing, 97110-Therapeutic exercises,  97530- Therapeutic activity, [id number]- Neuromuscular re-education, 97535- Self Care, 02859- Manual therapy, 97016- Vasopneumatic device, 79439 (1-2 muscles), 20561 (3+ muscles)- Dry Needling, Patient/Family education, Cryotherapy, and Moist heat  PLAN FOR NEXT SESSION: PROM Rt shoulder, work on restoring full range. Strength progression to tolerance   Lucie Meeter, PT, DPT, ATC 10/03/24 3:45 PM "

## 2024-10-10 ENCOUNTER — Ambulatory Visit

## 2024-10-10 DIAGNOSIS — M25611 Stiffness of right shoulder, not elsewhere classified: Secondary | ICD-10-CM

## 2024-10-10 DIAGNOSIS — R600 Localized edema: Secondary | ICD-10-CM

## 2024-10-10 DIAGNOSIS — R2689 Other abnormalities of gait and mobility: Secondary | ICD-10-CM

## 2024-10-10 DIAGNOSIS — M25562 Pain in left knee: Secondary | ICD-10-CM

## 2024-10-10 DIAGNOSIS — M6281 Muscle weakness (generalized): Secondary | ICD-10-CM

## 2024-10-10 NOTE — Therapy (Signed)
 " OUTPATIENT PHYSICAL THERAPY TREATMENT RE-EVALUATION  RE-CERTIFICATION   Patient Name: Donna Montgomery MRN: [id number] DOB:[birth year], 29 y.o., female Today's Date: 10/10/2024  END OF SESSION:  PT End of Session - 10/10/24 1410     Visit Number 4    Number of Visits 20    Date for Recertification  12/09/24    Authorization Type [employer]    Authorization Time Period 1/6-11/17/24    Authorization - Visit Number 3    Authorization - Number of Visits 6    PT Start Time 1413   patient late   PT Stop Time 1457    PT Time Calculation (min) 44 min    Activity Tolerance Patient tolerated treatment well    Behavior During Therapy [facility] for tasks assessed/performed             Past Medical History:  Diagnosis Date   Allergy    Anemia 2014   My blood tests in college said i had low iron and I was given medication. It has been low since.   Anxiety 2014   Had anxiety my whole life, but recognized in college by health professional.   Asthma    Depression 2014   Noticed symptoms in college, was put onto two different medicines. Stopped taking medication and seeked therapy. Symptoms come and go.   Heart murmur 05/08/2019   Ulcer 2013   Bleeding ulcer from H. Bacteria.   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 episode (HCC) 08/05/2023   Chronic right shoulder pain 08/05/2023   Acute otitis externa of left ear 08/05/2023   Tinnitus of left ear 08/05/2023   Menorrhagia with regular cycle 10/24/2022   Iron deficiency anemia 10/24/2022   Depression, recurrent 03/25/2020   Anxiety 03/25/2020   Chronic toe pain, right foot 07/27/2017   Asthma  08/29/2010   EXERCISE INDUCED ASTHMA 07/11/2010    PCP: Bevin Bernice RAMAN, DO  REFERRING PROVIDER: Jennye Aleck SAILOR, PA-C  Bonner Hair, MD  REFERRING DIAG: status post labral repair of the shoulder   Other specified postprocedural status   THERAPY DIAG:  Stiffness of right shoulder, not elsewhere classified  Muscle weakness (generalized)  Acute pain of left knee  Localized edema  Other abnormalities of gait and mobility  Rationale for Evaluation and Treatment: Rehabilitation  ONSET DATE: 06/01/24 shoulder surgery. 10/04/24 Lt knee surgery   SUBJECTIVE:  SUBJECTIVE STATEMENT: Patient underwent Lt knee arthroscopy on 10/04/24. Since surgery she reports that the knee feels pretty good. She had post-op f/u today and they discussed ongoing cartilage issues. Plan is to proceed with PT for rehab s/p lateral meniscectomy. If she continues to have lateral knee pain they will try bracing for the cartilage issues and potential secondary surgery in the future. Bending the knee is hard to do. Was using crutches for the first 3 days, but is no longer using an AD. Per patient no running/jumping for 4 weeks.   EVAL: For the past month patient has been focusing on light strengthening and ROM activity s/p Rt SAD, DCE, bicep tenodesis, and bankart repair on 06/01/24. She reports she has not been experiencing pain. Still has difficulty with overhead activity and reaching. 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 She also has plans to undergo Lt knee surgery on 10/04/24.  Hand dominance: Right  PERTINENT HISTORY: s/p Rt SAD, DCE, bicep tenodesis, and bankart repair on 06/01/24.  Lt lateral meniscectomy 10/04/24  PAIN:  Are you having pain? Yes: NPRS scale: 4/10 Pain  location: Lt knee  Pain description: ache Aggravating factors: bending, stair negotiation Relieving factors: ice, elevation, compression, medication   PRECAUTIONS: Other: see protocol on file for shoulder; for knee no running/jumping for 4 weeks starting 10/04/24   RED FLAGS: None   WEIGHT BEARING RESTRICTIONS: advanced RUE weightbearing to tolerance  FALLS:  Has patient fallen in last 6 months? No  LIVING ENVIRONMENT: Lives with: lives with their family Lives in: House/apartment   OCCUPATION: Professional basketball player  PLOF: Independent  PATIENT GOALS: shoot without pain, lifting heavy weights like I was before, get full ROM.   NEXT MD VISIT: 10/04/24  OBJECTIVE:  Note: Objective measures were completed at Evaluation unless otherwise noted.  DIAGNOSTIC FINDINGS:  None on file   PATIENT SURVEYS :  Quick DASH: 25% disability   10/10/24: LEFS: 20/80  COGNITION: Overall cognitive status: Within functional limits for tasks assessed     SENSATION: Not tested  POSTURE: Rounded shoulders   UPPER EXTREMITY ROM:   Active ROM Right eval Left eval 10/03/24 Right AROM  Shoulder flexion 130 standing   152 standing  Shoulder extension     Shoulder abduction 128 standing   150 standing   Shoulder adduction     Shoulder internal rotation 71    Shoulder external rotation 50    Elbow flexion     Elbow extension     Wrist flexion     Wrist extension     Wrist ulnar deviation     Wrist radial deviation     Wrist pronation     Wrist supination     (Blank rows = not tested)  UPPER EXTREMITY MMT:  MMT Right eval Left eval  Shoulder flexion 4   Shoulder extension    Shoulder abduction 5   Shoulder adduction    Shoulder internal rotation 5   Shoulder external rotation 4   Middle trapezius    Lower trapezius    Elbow flexion 4+   Elbow extension 4   Wrist flexion    Wrist extension    Wrist ulnar deviation    Wrist radial deviation    Wrist  pronation    Wrist supination    Grip strength (lbs)    (Blank rows = not tested)   LOWER EXTREMITY ROM:     Active  Right 10/10/24  Left 10/10/24  Hip flexion    Hip extension    Hip abduction    Hip adduction    Hip internal rotation    Hip external rotation    Knee flexion  105  Knee extension  Lacking 2   Ankle dorsiflexion    Ankle plantarflexion    Ankle inversion    Ankle eversion     (Blank rows = not tested) LOWER EXTREMITY MMT:    MMT Right 10/10/24 Left 10/10/24  Hip flexion    Hip extension    Hip abduction    Hip adduction    Hip internal rotation    Hip external rotation    Knee flexion  deferred  Knee extension  deferred  Ankle dorsiflexion    Ankle plantarflexion    Ankle inversion    Ankle eversion     (Blank rows = not tested)   PALPATION:  Not assessed   GAIT: 10/10/24: lacks knee flexion during gait cycle on LLE, valgus collapse, independent   [facility] Adult PT Treatment:                                                DATE: 10/10/24 Therapeutic Exercise: HS stretch with strap 2 x 30 sec Heel slides x 10  Calf raise DL x 10, SL x 10 Seated knee flexion AAROM x 10  HEP issued for knee, reviewed for shoulder  Neuromuscular re-ed: SLR x 10  Quad set x 5 LAQ x 10  SL balance x 30 sec   Modalities: Vaso (game ready) lt knee medium compression 34 degrees x 10 minutes  Self Care: Continue with ice, elevation, and compression for swelling    [facility] Adult PT Treatment:                                                DATE: 10/03/24  Manual Therapy: Rt shoulder PROM flexion, abduction, ER, IR to tolerance  Neuromuscular re-ed: Bodyblade 90 degrees flexion 2 x 20 sec each neutral and pronated  Resisted shoulder adduction 2 x 10  90/90 resisted IR red band 2 x 10  90/90 resisted ER red band 2 x 10  Wall ball 1/2 circle taps red med ball 2 x 5  Wall C's red band 2 x 5  Therapeutic Activity: Pulleys flexion x 2 minutes     [facility] Adult PT  Treatment:                                                DATE: 09/26/24  Manual Therapy: STM/trigger point release Rt rhomboids, use of theracane and tennis ball for self-soft tissue mobilization  Rt shoulder PROM flexion, ER, IR, abduction Neuromuscular re-ed: Resisted rows 25 lbs 2 x 15  Sidelying ER 2 x 15; @ 2 lbs then @ 3 lbs  Quadruped arm reach 2 x 10  Wall ball circles CW/CCW flexion 2 x 20 each  Therapeutic Activity: Pulleys flexion/scaption x 2 minutes  Finger ladder flexion/abduction x 5 each  Standing shoulder flexion 2 lbs 2 x 5 Standing shoulder abduction 2 lbs  2 x 5               PATIENT EDUCATION: Education details: see treatment; POC Person educated: Patient Education method: Explanation, Demonstration, Tactile cues, and Verbal cues Education comprehension: verbalized understanding, returned demonstration, verbal cues required, tactile cues required, and needs further education  HOME EXERCISE PROGRAM: Access Code: [email or web address] SHOULDER URL: [email or web address] Date: 09/26/2024 Prepared by: Lucie Meeter  Exercises - Shoulder Flexion Wall Slide with Towel  - 1 x daily - 7 x weekly - 1 sets - 10 reps - 5 sec  hold - Standing Shoulder Abduction Slides at Wall  - 1 x daily - 7 x weekly - 1 sets - 10 reps - 5 sec  hold - Standing Shoulder External Rotation Stretch in Doorway  - 1 x daily - 7 x weekly - 3 sets - 20 sec  hold - Standing Shoulder Row with Anchored Resistance  - 1 x daily - 7 x weekly - 3 sets - 10 reps - Quadruped Alternating Arm Lift  - 1 x daily - 7 x weekly - 2 sets - 10 reps - Supine Shoulder Flexion with Dowel  - 1 x daily - 7 x weekly - 1 sets - 10 reps - 10 sec  hold - Shoulder Abduction with Dumbbells - Thumbs Up  - 1 x daily - 7 x weekly - 2 sets - 5 reps - Standing Shoulder Flexion to 90 Degrees with Dumbbells  - 1 x daily - 7 x weekly - 2 sets - 5 reps - Shoulder External Rotation with Anchored Resistance  - 1 x daily - 3 x  weekly - 3 sets - 10 reps - Standing Bent Over Triceps Extension  - 1 x daily - 3 x weekly - 3 sets - 10 reps - Standing Bicep Curls Supinated with Dumbbells  - 1 x daily - 3 x weekly - 3 sets - 10 reps - Standing Shoulder Horizontal Abduction with Resistance  - 1 x daily - 3 x weekly - 3 sets - 10 reps - Shoulder Taps on Table  - 1 x daily - 3 x weekly - 3 sets - 10 reps - Sidelying Shoulder ER with Towel and Dumbbell  - 1 x daily - 3 x weekly - 3 sets - 10 reps - Standing Wall [employer] with Mini Swiss Ball  - 1 x daily - 3 x weekly - 2 sets - 20 reps  Access Code: [email or web address] URL: [email or web address] Date: 10/10/2024 Prepared by: Lucie Meeter  Exercises - Supine Quad Set  - 2 x daily - 7 x weekly - 1 sets - 10 reps - Supine Active Straight Leg Raise  - 2 x daily - 7 x weekly - 2 sets - 10 reps - Supine Hamstring Stretch with Strap  - 2 x daily - 7 x weekly - 3 sets - 30 sec  hold - Supine Heel Slide with Strap  - 2 x daily - 7 x weekly - 1 sets - 10 reps - Seated Long Arc Quad  - 2 x daily - 7 x weekly - 2 sets - 10 reps - Standing Single Leg Heel Raise  - 2 x daily - 7 x weekly - 2 sets - 10 reps - Single Leg Stance  - 2 x daily - 7 x weekly - 3 sets - 30 sec  hold - Seated Knee Flexion AAROM  - 2 x daily - 7 x weekly - 1 sets - 10 reps  ASSESSMENT:  CLINICAL IMPRESSION: Patient with new referral to evaluate and treat s/p Lt knee arthroscopy on 10/04/24. She demonstrates ROM, strength, gait and balance deficits that are consistent with her recent post-operative status. She will benefit from skilled PT to address the above stated deficits in order to return to optimal function.    EVAL: Patient is a 29 y.o. female who was seen today for physical therapy evaluation and treatment for s/p Rt SAD, DCE, bicep tenodesis, and bankart repair on 06/01/24. She has been completing exercises independently over the past month secondary to insurance authorization constraints at  the end of the year. 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    OBJECTIVE IMPAIRMENTS: decreased activity tolerance, decreased endurance, decreased knowledge of condition, decreased ROM, decreased strength, impaired flexibility, impaired UE functional use, postural dysfunction, and pain.   ACTIVITY LIMITATIONS: carrying, lifting, bathing, reach over head, hygiene/grooming, and caring for others  PARTICIPATION LIMITATIONS: cleaning, laundry, community activity, occupation, and recreational activity  PERSONAL FACTORS: Age, Profession, Time since onset of injury/illness/exacerbation, and 3+ comorbidities: see PMH above are also affecting patient's functional outcome.   REHAB POTENTIAL: Good  CLINICAL DECISION MAKING: Stable/uncomplicated  EVALUATION COMPLEXITY: Low  GOALS: Goals reviewed with patient? Yes  SHORT TERM GOALS: Target date: 11/07/2024 updated 10/10/24      Patient will be independent and compliant with initial HEP.   Baseline: initial HEP issued  Goal status: MET  2.  Patient will demonstrate at least 160 degrees of Rt shoulder flexion AROM to improve ability to block in basketball.  Baseline: see above Goal status: progressing   3.  Patient will demonstrate at least 160 degrees of Rt shoulder abduction AROM to improve ability to rebound basketball.  Baseline: see above Goal status: progressing   4.  Patient will demonstrate at least 65 degrees of Rt shoulder ER AROM to improve ability to complete self-care activities.  Baseline: see above Goal status: INITIAL  5. Patient will demonstrate 120 degrees of Lt knee flexion AROM to improve ability to  complete squatting activity  Baseline: see above  Goal status: NEW  6. Patient will demonstrate full Lt knee extension AROM to improve gait mechanics.   Baseline: see above  Goal status: NEW    LONG TERM GOALS: Target date: 12/09/24; updated 10/10/24   Patient will score </= 10 % disability on the QuickDASH (MCID is 8-15.9) to signify clinically meaningful improvement in functional abilities.   Baseline: 25 Goal status: INITIAL  2.  Patient will demonstrate 5/5 Rt shoulder strength to improve stability with lifting activity.  Baseline: see above Goal status: INITIAL  3.  Patient will be able to shoot 20 ft jump shot and layup without pain.  Baseline: unable  Goal status: INITIAL  4.  Patient will be independent with advanced home program to progress/maintain current level of function.  Baseline: initial HEP issued  Goal status: INITIAL  5. Patient will demonstrate 5/5 Lt knee strength to improve gait stability.   Baseline: deferred due to post-op acuity  Goal status: NEW  6. Patient will be able to run for at least 5 minutes with minimal/no Lt knee pain  Baseline: unable  Goal status: NEW  7. Patient will be able to complete DL jump for max height with minimal/no Lt knee pain in order to improve ability to rebound.   Baseline: unable  Goal status: NEW  8. Patient will score >/= 60/80 on the LEFS (MCID is 9) to signify clinically meaningful improvement in functional abilities.   Baseline: see above  Goal status: NEW    PLAN: PT FREQUENCY: 2x/week  PT DURATION: 8 weeks  PLANNED INTERVENTIONS: 97164- PT Re-evaluation, 97750- Physical Performance Testing, 97110-Therapeutic exercises, 97530- Therapeutic activity, [id number]- Neuromuscular re-education, 97535- Self Care, [id number]- Manual therapy, [id number]- Gait training, [phone]- Electrical stimulation (unattended), [id number]- Electrical stimulation (manual), 97016- Vasopneumatic device, 20560 (1-2 muscles), 20561 (3+ muscles)- Dry  Needling, Patient/Family education, Cryotherapy, and Moist heat  PLAN FOR NEXT SESSION: PROM Rt shoulder, work on restoring full range. Strength progression to tolerance; progress knee ROM and strength as tolerated   Rosealee Recinos, PT, DPT, ATC 10/10/24 3:04 PM "

## 2024-10-17 ENCOUNTER — Ambulatory Visit: Attending: Sports Medicine

## 2024-10-17 DIAGNOSIS — M25611 Stiffness of right shoulder, not elsewhere classified: Secondary | ICD-10-CM

## 2024-10-17 DIAGNOSIS — M25562 Pain in left knee: Secondary | ICD-10-CM

## 2024-10-17 DIAGNOSIS — M6281 Muscle weakness (generalized): Secondary | ICD-10-CM

## 2024-10-17 DIAGNOSIS — R600 Localized edema: Secondary | ICD-10-CM

## 2024-10-23 ENCOUNTER — Ambulatory Visit: Payer: Self-pay

## 2024-10-24 ENCOUNTER — Ambulatory Visit: Admitting: Physical Therapy

## 2024-10-31 ENCOUNTER — Ambulatory Visit

## 2024-11-07 ENCOUNTER — Ambulatory Visit
